# Patient Record
Sex: Female | Born: 1935
Health system: Southern US, Community
[De-identification: ages and names within clinical notes are randomized; demographics above are authoritative.]

## PROBLEM LIST (undated history)

## (undated) DIAGNOSIS — N189 Chronic kidney disease, unspecified: Secondary | ICD-10-CM

## (undated) DIAGNOSIS — R112 Nausea with vomiting, unspecified: Secondary | ICD-10-CM

## (undated) DIAGNOSIS — I10 Essential (primary) hypertension: Secondary | ICD-10-CM

## (undated) DIAGNOSIS — I714 Abdominal aortic aneurysm, without rupture, unspecified: Secondary | ICD-10-CM

## (undated) DIAGNOSIS — E785 Hyperlipidemia, unspecified: Secondary | ICD-10-CM

## (undated) DIAGNOSIS — Z9889 Other specified postprocedural states: Secondary | ICD-10-CM

## (undated) DIAGNOSIS — E079 Disorder of thyroid, unspecified: Secondary | ICD-10-CM

## (undated) HISTORY — DX: Abdominal aortic aneurysm, without rupture, unspecified: I71.40

## (undated) HISTORY — DX: Chronic kidney disease, unspecified: N18.9

## (undated) HISTORY — PX: EYE SURGERY: SHX253

## (undated) HISTORY — PX: CATARACT EXTRACTION, BILATERAL: SHX1313

## (undated) HISTORY — DX: Abdominal aortic aneurysm, without rupture: I71.4

## (undated) HISTORY — PX: ABDOMINAL HYSTERECTOMY: SHX81

## (undated) HISTORY — DX: Disorder of thyroid, unspecified: E07.9

---

## 1997-09-24 ENCOUNTER — Ambulatory Visit (HOSPITAL_COMMUNITY): Admission: RE | Admit: 1997-09-24 | Discharge: 1997-09-24 | Payer: Self-pay | Admitting: Gynecology

## 1997-09-24 ENCOUNTER — Encounter: Payer: Self-pay | Admitting: Gynecology

## 1998-11-17 ENCOUNTER — Other Ambulatory Visit: Admission: RE | Admit: 1998-11-17 | Discharge: 1998-11-17 | Payer: Self-pay | Admitting: Gynecology

## 1998-11-20 ENCOUNTER — Encounter: Payer: Self-pay | Admitting: Gynecology

## 1998-11-20 ENCOUNTER — Ambulatory Visit (HOSPITAL_COMMUNITY): Admission: RE | Admit: 1998-11-20 | Discharge: 1998-11-20 | Payer: Self-pay | Admitting: Gynecology

## 1998-12-04 ENCOUNTER — Encounter (INDEPENDENT_AMBULATORY_CARE_PROVIDER_SITE_OTHER): Payer: Self-pay

## 1998-12-04 ENCOUNTER — Other Ambulatory Visit: Admission: RE | Admit: 1998-12-04 | Discharge: 1998-12-04 | Payer: Self-pay | Admitting: Gynecology

## 2000-02-17 ENCOUNTER — Ambulatory Visit (HOSPITAL_COMMUNITY): Admission: RE | Admit: 2000-02-17 | Discharge: 2000-02-17 | Payer: Self-pay | Admitting: Gynecology

## 2000-02-17 ENCOUNTER — Encounter: Payer: Self-pay | Admitting: Gynecology

## 2000-02-23 ENCOUNTER — Other Ambulatory Visit: Admission: RE | Admit: 2000-02-23 | Discharge: 2000-02-23 | Payer: Self-pay | Admitting: Gynecology

## 2001-02-22 ENCOUNTER — Encounter: Admission: RE | Admit: 2001-02-22 | Discharge: 2001-02-22 | Payer: Self-pay | Admitting: Gynecology

## 2001-02-22 ENCOUNTER — Encounter: Payer: Self-pay | Admitting: Gynecology

## 2001-05-04 ENCOUNTER — Other Ambulatory Visit: Admission: RE | Admit: 2001-05-04 | Discharge: 2001-05-04 | Payer: Self-pay | Admitting: Gynecology

## 2001-05-09 ENCOUNTER — Encounter: Admission: RE | Admit: 2001-05-09 | Discharge: 2001-05-09 | Payer: Self-pay | Admitting: Gynecology

## 2001-05-09 ENCOUNTER — Encounter: Payer: Self-pay | Admitting: Gynecology

## 2002-03-29 ENCOUNTER — Encounter: Admission: RE | Admit: 2002-03-29 | Discharge: 2002-03-29 | Payer: Self-pay | Admitting: Family Medicine

## 2002-03-29 ENCOUNTER — Encounter: Payer: Self-pay | Admitting: Family Medicine

## 2002-10-10 ENCOUNTER — Other Ambulatory Visit: Admission: RE | Admit: 2002-10-10 | Discharge: 2002-10-10 | Payer: Self-pay | Admitting: Gynecology

## 2005-11-18 ENCOUNTER — Encounter: Admission: RE | Admit: 2005-11-18 | Discharge: 2005-11-18 | Payer: Self-pay | Admitting: Family Medicine

## 2005-12-21 ENCOUNTER — Encounter: Admission: RE | Admit: 2005-12-21 | Discharge: 2005-12-21 | Payer: Self-pay | Admitting: Family Medicine

## 2009-10-24 ENCOUNTER — Encounter (INDEPENDENT_AMBULATORY_CARE_PROVIDER_SITE_OTHER): Payer: Self-pay | Admitting: *Deleted

## 2009-11-06 ENCOUNTER — Encounter (INDEPENDENT_AMBULATORY_CARE_PROVIDER_SITE_OTHER): Payer: Self-pay | Admitting: *Deleted

## 2009-11-10 ENCOUNTER — Ambulatory Visit: Payer: Self-pay | Admitting: Gastroenterology

## 2009-11-24 ENCOUNTER — Ambulatory Visit: Payer: Self-pay | Admitting: Gastroenterology

## 2009-11-25 ENCOUNTER — Encounter: Payer: Self-pay | Admitting: Gastroenterology

## 2010-02-17 NOTE — Letter (Signed)
Summary: Pre Visit Letter Revised  [facility]  [place], [place] [address]   Phone: [phone]  Fax: [phone]        10/24/2009 MRN: [id number] Kristin Gross [address] [place],   [address]             Procedure Date:  11/24/2009   Welcome to the Gastroenterology Division at [facility].    You are scheduled to see a nurse for your pre-procedure visit on 11/10/2009 at 1:30PM on the 3rd floor at [facility] [place] [employer].  We ask that you try to arrive at our office 15 minutes prior to your appointment time to allow for check-in.  Please take a minute to review the attached form.  If you answer "Yes" to one or more of the questions on the first page, we ask that you call the person listed at your earliest opportunity.  If you answer "No" to all of the questions, please complete the rest of the form and bring it to your appointment.    Your nurse visit will consist of discussing your medical and surgical history, your immediate family medical history, and your medications.   If you are unable to list all of your medications on the form, please bring the medication bottles to your appointment and we will list them.  We will need to be aware of both prescribed and over the counter drugs.  We will need to know exact dosage information as well.    Please be prepared to read and sign documents such as consent forms, a financial agreement, and acknowledgement forms.  If necessary, and with your consent, a friend or relative is welcome to sit-in on the nurse visit with you.  Please bring your insurance card so that we may make a copy of it.  If your insurance requires a referral to see a specialist, please bring your referral form from your primary care physician.  No co-pay is required for this nurse visit.     If you cannot keep your appointment, please call [phone] to cancel or reschedule prior to your appointment date.  This  allows [place] the opportunity to schedule an appointment for another patient in need of care.    Thank you for choosing [facility] for your medical needs.  We appreciate the opportunity to care for you.  Please visit [place] at our website  to learn more about our practice.  Sincerely, The Gastroenterology Division

## 2010-02-17 NOTE — Miscellaneous (Signed)
Summary: previsit prep/rm  Clinical Lists Changes  Medications: Added new medication of MOVIPREP 100 GM  SOLR (PEG-KCL-NACL-NASULF-NA ASC-C) As per prep instructions. - Signed Rx of MOVIPREP 100 GM  SOLR (PEG-KCL-NACL-NASULF-NA ASC-C) As per prep instructions.;  #1 x 0;  Signed;  Entered by: Sundra Aland RN;  Authorized by: Sable Feil MD [facility];  Method used: Electronically to [facility]  [phone]*, [address], [place], [place], [place]  [address], Ph: [phone] or [phone], Fax: [phone] Observations: Added new observation of ALLERGY REV: Done (11/10/2009 13:24) Added new observation of NKA: T (11/10/2009 13:24)    Prescriptions: MOVIPREP 100 GM  SOLR (PEG-KCL-NACL-NASULF-NA ASC-C) As per prep instructions.  #1 x 0   Entered by:   Sundra Aland RN   Authorized by:   Sable Feil MD [facility]   Signed by:   Sundra Aland RN on 11/10/2009   Method used:   Electronically to        [facility]  [phone]* (retail)       [address]       [place], [place]  [address]       Ph: [phone] or [phone]       Fax: [phone]   RxID:   [id number]

## 2010-02-17 NOTE — Letter (Signed)
Summary: Patient Notice- Polyp Results  [facility]  [address] [place], [place] [address]   Phone: [phone]  Fax: [phone]        November 25, 2009 MRN: [id number]    Kristin Gross [place], [place]  [address]    Dear Ms. Kristin Gross,  I am pleased to inform you that the colon polyp(s) removed during your recent colonoscopy was (were) found to be benign (no cancer detected) upon pathologic examination.  I recommend you have a repeat colonoscopy examination in 5_ years to look for recurrent polyps, as having colon polyps increases your risk for having recurrent polyps or even colon cancer in the future.Your polyps were hyperplastic polyps, I would recommend followup in 5 years time because of the multiple polyps we encountered.  Should you develop new or worsening symptoms of abdominal pain, bowel habit changes or bleeding from the rectum or bowels, please schedule an evaluation with either your primary care physician or with me.  Additional information/recommendations:  _xx_ No further action with gastroenterology is needed at this time. Please      follow-up with your primary care physician for your other healthcare      needs.  __ Please call [phone] to schedule a return visit to review your      situation.  __ Please keep your follow-up visit as already scheduled.  __ Continue treatment plan as outlined the day of your exam.  Please call us if you are having persistent problems or have questions about your condition that have not been fully answered at this time.  Sincerely,  Sable Feil MD [facility]  This letter has been electronically signed by your physician.  Appended Document: Patient Notice- Polyp Results letter mailed

## 2010-02-17 NOTE — Letter (Signed)
Summary: Moviprep Instructions  [facility]  [address]   [place], [place] [address]   Phone: [phone]  Fax: [phone]       Kristin Gross    December 02, 1934    MRN: [id number]        Procedure Day Sudie Grumbling: Monday, 11-24-09     Arrival Time: 8:30 a.m.     Procedure Time: 9:30 a.m.     Location of Procedure:                    x [facility] (4th Floor)                        [place]   Starting 5 days prior to your procedure 11-19-09 do not eat nuts, seeds, popcorn, corn, beans, peas,  salads, or any raw vegetables.  Do not take any fiber supplements (e.g. Metamucil, Citrucel, and Benefiber).  THE DAY BEFORE YOUR PROCEDURE         DATE: 11-23-09   DAY: Sunday  1.  Drink clear liquids the entire day-NO SOLID FOOD  2.  Do not drink anything colored red or purple.  Avoid juices with pulp.  No orange juice.  3.  Drink at least 64 oz. (8 glasses) of fluid/clear liquids during the day to prevent dehydration and help the prep work efficiently.  CLEAR LIQUIDS INCLUDE: Water Jello Ice Popsicles Tea (sugar ok, no milk/cream) Powdered fruit flavored drinks Coffee (sugar ok, no milk/cream) Gatorade Juice: apple, white grape, white cranberry  Lemonade Clear bullion, consomm, broth Carbonated beverages (any kind) Strained chicken noodle soup Hard Candy                             4.  In the morning, mix first dose of MoviPrep solution:    Empty 1 Pouch A and 1 Pouch B into the disposable container    Add lukewarm drinking water to the top line of the container. Mix to dissolve    Refrigerate (mixed solution should be used within 24 hrs)  5.  Begin drinking the prep at 5:00 p.m. The MoviPrep container is divided by 4 marks.   Every 15 minutes drink the solution down to the next mark (approximately 8 oz) until the full liter is complete.   6.  Follow completed prep with 16 oz of clear liquid of your choice  (Nothing red or purple).  Continue to drink clear liquids until bedtime.  7.  Before going to bed, mix second dose of MoviPrep solution:    Empty 1 Pouch A and 1 Pouch B into the disposable container    Add lukewarm drinking water to the top line of the container. Mix to dissolve    Refrigerate  THE DAY OF YOUR PROCEDURE      DATE: 11-24-09  DAY: Monday  Beginning at  4:30 a.m. (5 hours before procedure):         1. Every 15 minutes, drink the solution down to the next mark (approx 8 oz) until the full liter is complete.  2. Follow completed prep with 16 oz. of clear liquid of your choice.    3. You may drink clear liquids until  7:30 a.m. (2 HOURS BEFORE PROCEDURE).   MEDICATION INSTRUCTIONS  Unless otherwise instructed, you should take regular prescription medications with a small sip of water   as early as possible the  morning of your procedure.    Additional medication instructions: n/a         OTHER INSTRUCTIONS  You will need a responsible adult at least 74 years of age to accompany you and drive you home.   This person must remain in the waiting room during your procedure.  Wear loose fitting clothing that is easily removed.  Leave jewelry and other valuables at home.  However, you may wish to bring a book to read or  an iPod/MP3 player to listen to music as you wait for your procedure to start.  Remove all body piercing jewelry and leave at home.  Total time from sign-in until discharge is approximately 2-3 hours.  You should go home directly after your procedure and rest.  You can resume normal activities the  day after your procedure.  The day of your procedure you should not:   Drive   Make legal decisions   Operate machinery   Drink alcohol   Return to work  You will receive specific instructions about eating, activities and medications before you leave.    The above instructions have been reviewed and explained to me by   Sundra Aland RN   November 10, 2009 1:48 PM   I fully understand and can verbalize these instructions _____________________________ Date _________

## 2010-02-17 NOTE — Procedures (Signed)
Summary: Colonoscopy  Patient: [name] Note: All result statuses are Final unless otherwise noted.  Tests: (1) Colonoscopy (COL)   COL Colonoscopy           [address]     [place], [place]  [address]           COLONOSCOPY PROCEDURE REPORT           PATIENT:  Kristin, Gross  MR#:  [id number]     BIRTHDATE:  03-13-1934, 75 yrs. old  GENDER:  female     ENDOSCOPIST:  Loralee Pacas. Sharlett Iles, MD, [facility]     REF. BY:  [name], M.D.     PROCEDURE DATE:  11/24/2009     PROCEDURE:  Colonoscopy with biopsy and snare polypectomy     ASA CLASS:  Class II     INDICATIONS:  Routine Risk Screening     MEDICATIONS:   Fentanyl 50 mcg IV, Versed 5 mg IV           DESCRIPTION OF PROCEDURE:   After the risks benefits and     alternatives of the procedure were thoroughly explained, informed     consent was obtained.  Digital rectal exam was performed and     revealed no abnormalities.   The LB PCF-H180AL [id number] endoscope     was introduced through the anus and advanced to the cecum, which     was identified by both the appendix and ileocecal valve, without     limitations.  The quality of the prep was excellent, using     MoviPrep.  The instrument was then slowly withdrawn as the colon     was fully examined.     <<PROCEDUREIMAGES>>           FINDINGS:  A sessile polyp was found. 8MM FLAT POLYP AT HEPATIC     FLEXURE HOT SNARE REMOVED.#1.  Moderate diverticulosis was found     in the sigmoid to descending colon segments.  There were multiple     polyps identified and removed. GREATER TAN 10 SMALL 2-5MM NODULES     HOT BX. AND SNARE REMOVED AND CAUTERIZED IN RECTUM.   Retroflexed     views in the rectum revealed no abnormalities.    The scope was     then withdrawn from the patient and the procedure completed.           COMPLICATIONS:  None     ENDOSCOPIC IMPRESSION:     1) Sessile polyp     2) Moderate diverticulosis in the sigmoid to descending  colon     segments     3) Polyps, multiple     1.RIGHT COLON ADENOMA     2.HYPERPLASTIC RECTAL NODULES.     RECOMMENDATIONS:     1) Await pathology results     2) Repeat Colonoscopy in 3 years.     3) high fiber diet     REPEAT EXAM:  No           ______________________________     Loralee Pacas. Sharlett Iles, MD, [facility]           CC:           n.     eSIGNED:   Loralee Pacas. Patterson at 11/24/2009 10:30 AM           Andrey Farmer, [id number]  Note: An exclamation mark (!) indicates a result  that was not dispersed into the flowsheet. Document Creation Date: 11/24/2009 10:31 AM _______________________________________________________________________  (1) Order result status: Final Collection or observation date-time: 11/24/2009 10:22 Requested date-time:  Receipt date-time:  Reported date-time:  Referring Physician:   Ordering Physician: Verl Blalock [phone]) Specimen Source:  Source: [name] Order Number: [id number] Lab site:   Appended Document: Colonoscopy     Procedures Next Due Date:    Colonoscopy: 11/2014

## 2010-08-11 ENCOUNTER — Ambulatory Visit (HOSPITAL_BASED_OUTPATIENT_CLINIC_OR_DEPARTMENT_OTHER): Admit: 2010-08-11 | Payer: Self-pay | Admitting: Orthopedic Surgery

## 2013-04-16 ENCOUNTER — Other Ambulatory Visit (HOSPITAL_COMMUNITY): Payer: Self-pay | Admitting: Family Medicine

## 2013-04-16 ENCOUNTER — Other Ambulatory Visit: Payer: Self-pay | Admitting: Family Medicine

## 2013-04-16 DIAGNOSIS — Z1231 Encounter for screening mammogram for malignant neoplasm of breast: Secondary | ICD-10-CM

## 2013-05-08 ENCOUNTER — Ambulatory Visit
Admission: RE | Admit: 2013-05-08 | Discharge: 2013-05-08 | Disposition: A | Discharge status: Home / Self Care | Payer: Medicare Other | Source: Ambulatory Visit | Attending: Family Medicine | Admitting: Family Medicine

## 2013-05-08 ENCOUNTER — Ambulatory Visit (HOSPITAL_COMMUNITY): Payer: Self-pay

## 2013-05-08 ENCOUNTER — Encounter (INDEPENDENT_AMBULATORY_CARE_PROVIDER_SITE_OTHER): Payer: Self-pay

## 2013-05-08 DIAGNOSIS — Z1231 Encounter for screening mammogram for malignant neoplasm of breast: Secondary | ICD-10-CM

## 2013-08-31 ENCOUNTER — Encounter: Payer: Self-pay | Admitting: Family Medicine

## 2014-11-26 ENCOUNTER — Encounter: Payer: Self-pay | Admitting: Internal Medicine

## 2015-04-23 ENCOUNTER — Other Ambulatory Visit: Payer: Self-pay | Admitting: Family Medicine

## 2015-04-23 DIAGNOSIS — L989 Disorder of the skin and subcutaneous tissue, unspecified: Secondary | ICD-10-CM | POA: Diagnosis not present

## 2015-04-23 DIAGNOSIS — Z1231 Encounter for screening mammogram for malignant neoplasm of breast: Secondary | ICD-10-CM

## 2015-04-23 DIAGNOSIS — M8580 Other specified disorders of bone density and structure, unspecified site: Secondary | ICD-10-CM | POA: Diagnosis not present

## 2015-04-23 DIAGNOSIS — E039 Hypothyroidism, unspecified: Secondary | ICD-10-CM | POA: Diagnosis not present

## 2015-04-23 DIAGNOSIS — C44519 Basal cell carcinoma of skin of other part of trunk: Secondary | ICD-10-CM | POA: Diagnosis not present

## 2015-04-23 DIAGNOSIS — E7800 Pure hypercholesterolemia, unspecified: Secondary | ICD-10-CM | POA: Diagnosis not present

## 2015-04-23 DIAGNOSIS — I129 Hypertensive chronic kidney disease with stage 1 through stage 4 chronic kidney disease, or unspecified chronic kidney disease: Secondary | ICD-10-CM | POA: Diagnosis not present

## 2015-04-23 DIAGNOSIS — N183 Chronic kidney disease, stage 3 unspecified: Secondary | ICD-10-CM

## 2015-04-23 DIAGNOSIS — E559 Vitamin D deficiency, unspecified: Secondary | ICD-10-CM | POA: Diagnosis not present

## 2015-04-23 DIAGNOSIS — Z720 Tobacco use: Secondary | ICD-10-CM | POA: Diagnosis not present

## 2015-04-23 DIAGNOSIS — L729 Follicular cyst of the skin and subcutaneous tissue, unspecified: Secondary | ICD-10-CM | POA: Diagnosis not present

## 2015-04-23 DIAGNOSIS — Z0000 Encounter for general adult medical examination without abnormal findings: Secondary | ICD-10-CM | POA: Diagnosis not present

## 2015-04-28 ENCOUNTER — Ambulatory Visit
Admission: RE | Admit: 2015-04-28 | Discharge: 2015-04-28 | Disposition: A | Discharge status: Home / Self Care | Payer: Medicare Other | Source: Ambulatory Visit | Attending: Family Medicine | Admitting: Family Medicine

## 2015-04-28 DIAGNOSIS — N189 Chronic kidney disease, unspecified: Secondary | ICD-10-CM | POA: Diagnosis not present

## 2015-04-28 DIAGNOSIS — N183 Chronic kidney disease, stage 3 unspecified: Secondary | ICD-10-CM

## 2015-05-12 ENCOUNTER — Ambulatory Visit
Admission: RE | Admit: 2015-05-12 | Discharge: 2015-05-12 | Disposition: A | Discharge status: Home / Self Care | Payer: Medicare Other | Source: Ambulatory Visit | Attending: Family Medicine | Admitting: Family Medicine

## 2015-05-12 DIAGNOSIS — Z1231 Encounter for screening mammogram for malignant neoplasm of breast: Secondary | ICD-10-CM | POA: Diagnosis not present

## 2015-05-22 DIAGNOSIS — M8589 Other specified disorders of bone density and structure, multiple sites: Secondary | ICD-10-CM | POA: Diagnosis not present

## 2015-05-22 DIAGNOSIS — M859 Disorder of bone density and structure, unspecified: Secondary | ICD-10-CM | POA: Diagnosis not present

## 2015-05-29 DIAGNOSIS — C44519 Basal cell carcinoma of skin of other part of trunk: Secondary | ICD-10-CM | POA: Diagnosis not present

## 2015-05-29 DIAGNOSIS — Z85828 Personal history of other malignant neoplasm of skin: Secondary | ICD-10-CM | POA: Diagnosis not present

## 2015-05-29 DIAGNOSIS — L720 Epidermal cyst: Secondary | ICD-10-CM | POA: Diagnosis not present

## 2015-06-18 DIAGNOSIS — Z720 Tobacco use: Secondary | ICD-10-CM | POA: Diagnosis not present

## 2015-06-18 DIAGNOSIS — I129 Hypertensive chronic kidney disease with stage 1 through stage 4 chronic kidney disease, or unspecified chronic kidney disease: Secondary | ICD-10-CM | POA: Diagnosis not present

## 2015-06-18 DIAGNOSIS — N183 Chronic kidney disease, stage 3 (moderate): Secondary | ICD-10-CM | POA: Diagnosis not present

## 2015-06-18 DIAGNOSIS — N390 Urinary tract infection, site not specified: Secondary | ICD-10-CM | POA: Diagnosis not present

## 2015-12-15 DIAGNOSIS — I129 Hypertensive chronic kidney disease with stage 1 through stage 4 chronic kidney disease, or unspecified chronic kidney disease: Secondary | ICD-10-CM | POA: Diagnosis not present

## 2015-12-15 DIAGNOSIS — Z720 Tobacco use: Secondary | ICD-10-CM | POA: Diagnosis not present

## 2015-12-15 DIAGNOSIS — Z23 Encounter for immunization: Secondary | ICD-10-CM | POA: Diagnosis not present

## 2015-12-15 DIAGNOSIS — E039 Hypothyroidism, unspecified: Secondary | ICD-10-CM | POA: Diagnosis not present

## 2015-12-15 DIAGNOSIS — E7800 Pure hypercholesterolemia, unspecified: Secondary | ICD-10-CM | POA: Diagnosis not present

## 2015-12-15 DIAGNOSIS — N183 Chronic kidney disease, stage 3 (moderate): Secondary | ICD-10-CM | POA: Diagnosis not present

## 2015-12-30 DIAGNOSIS — I129 Hypertensive chronic kidney disease with stage 1 through stage 4 chronic kidney disease, or unspecified chronic kidney disease: Secondary | ICD-10-CM | POA: Diagnosis not present

## 2015-12-30 DIAGNOSIS — N183 Chronic kidney disease, stage 3 (moderate): Secondary | ICD-10-CM | POA: Diagnosis not present

## 2015-12-30 DIAGNOSIS — N390 Urinary tract infection, site not specified: Secondary | ICD-10-CM | POA: Diagnosis not present

## 2015-12-30 DIAGNOSIS — Z6829 Body mass index (BMI) 29.0-29.9, adult: Secondary | ICD-10-CM | POA: Diagnosis not present

## 2015-12-30 DIAGNOSIS — Z720 Tobacco use: Secondary | ICD-10-CM | POA: Diagnosis not present

## 2016-03-19 DIAGNOSIS — E7800 Pure hypercholesterolemia, unspecified: Secondary | ICD-10-CM | POA: Diagnosis not present

## 2016-06-09 DIAGNOSIS — Z0000 Encounter for general adult medical examination without abnormal findings: Secondary | ICD-10-CM | POA: Diagnosis not present

## 2016-06-09 DIAGNOSIS — I129 Hypertensive chronic kidney disease with stage 1 through stage 4 chronic kidney disease, or unspecified chronic kidney disease: Secondary | ICD-10-CM | POA: Diagnosis not present

## 2016-06-09 DIAGNOSIS — E559 Vitamin D deficiency, unspecified: Secondary | ICD-10-CM | POA: Diagnosis not present

## 2016-06-09 DIAGNOSIS — N183 Chronic kidney disease, stage 3 (moderate): Secondary | ICD-10-CM | POA: Diagnosis not present

## 2016-06-29 DIAGNOSIS — N183 Chronic kidney disease, stage 3 (moderate): Secondary | ICD-10-CM | POA: Diagnosis not present

## 2016-06-29 DIAGNOSIS — Z6829 Body mass index (BMI) 29.0-29.9, adult: Secondary | ICD-10-CM | POA: Diagnosis not present

## 2016-06-29 DIAGNOSIS — Z720 Tobacco use: Secondary | ICD-10-CM | POA: Diagnosis not present

## 2016-06-29 DIAGNOSIS — I129 Hypertensive chronic kidney disease with stage 1 through stage 4 chronic kidney disease, or unspecified chronic kidney disease: Secondary | ICD-10-CM | POA: Diagnosis not present

## 2016-12-22 DIAGNOSIS — N183 Chronic kidney disease, stage 3 (moderate): Secondary | ICD-10-CM | POA: Diagnosis not present

## 2016-12-22 DIAGNOSIS — E559 Vitamin D deficiency, unspecified: Secondary | ICD-10-CM | POA: Diagnosis not present

## 2016-12-22 DIAGNOSIS — I129 Hypertensive chronic kidney disease with stage 1 through stage 4 chronic kidney disease, or unspecified chronic kidney disease: Secondary | ICD-10-CM | POA: Diagnosis not present

## 2016-12-22 DIAGNOSIS — E039 Hypothyroidism, unspecified: Secondary | ICD-10-CM | POA: Diagnosis not present

## 2017-01-14 DIAGNOSIS — N179 Acute kidney failure, unspecified: Secondary | ICD-10-CM | POA: Diagnosis not present

## 2017-01-24 DIAGNOSIS — N179 Acute kidney failure, unspecified: Secondary | ICD-10-CM | POA: Diagnosis not present

## 2017-02-09 DIAGNOSIS — N179 Acute kidney failure, unspecified: Secondary | ICD-10-CM | POA: Diagnosis not present

## 2017-03-02 DIAGNOSIS — N183 Chronic kidney disease, stage 3 (moderate): Secondary | ICD-10-CM | POA: Diagnosis not present

## 2017-03-02 DIAGNOSIS — I129 Hypertensive chronic kidney disease with stage 1 through stage 4 chronic kidney disease, or unspecified chronic kidney disease: Secondary | ICD-10-CM | POA: Diagnosis not present

## 2017-03-02 DIAGNOSIS — N179 Acute kidney failure, unspecified: Secondary | ICD-10-CM | POA: Diagnosis not present

## 2017-03-02 DIAGNOSIS — Z720 Tobacco use: Secondary | ICD-10-CM | POA: Diagnosis not present

## 2017-03-02 DIAGNOSIS — N390 Urinary tract infection, site not specified: Secondary | ICD-10-CM | POA: Diagnosis not present

## 2017-03-03 ENCOUNTER — Other Ambulatory Visit: Payer: Self-pay | Admitting: *Deleted

## 2017-03-03 DIAGNOSIS — I713 Abdominal aortic aneurysm, ruptured, unspecified: Secondary | ICD-10-CM

## 2017-03-03 DIAGNOSIS — N179 Acute kidney failure, unspecified: Secondary | ICD-10-CM | POA: Diagnosis not present

## 2017-03-03 DIAGNOSIS — I714 Abdominal aortic aneurysm, without rupture: Secondary | ICD-10-CM | POA: Diagnosis not present

## 2017-03-03 DIAGNOSIS — N281 Cyst of kidney, acquired: Secondary | ICD-10-CM | POA: Diagnosis not present

## 2017-03-09 ENCOUNTER — Ambulatory Visit
Admission: RE | Admit: 2017-03-09 | Discharge: 2017-03-09 | Disposition: A | Discharge status: Home / Self Care | Payer: Medicare Other | Source: Ambulatory Visit | Attending: Vascular Surgery | Admitting: Vascular Surgery

## 2017-03-09 ENCOUNTER — Other Ambulatory Visit: Payer: Self-pay | Admitting: Vascular Surgery

## 2017-03-09 DIAGNOSIS — D3502 Benign neoplasm of left adrenal gland: Secondary | ICD-10-CM | POA: Insufficient documentation

## 2017-03-09 DIAGNOSIS — K439 Ventral hernia without obstruction or gangrene: Secondary | ICD-10-CM | POA: Diagnosis not present

## 2017-03-09 DIAGNOSIS — I2510 Atherosclerotic heart disease of native coronary artery without angina pectoris: Secondary | ICD-10-CM | POA: Diagnosis not present

## 2017-03-09 DIAGNOSIS — K8020 Calculus of gallbladder without cholecystitis without obstruction: Secondary | ICD-10-CM | POA: Insufficient documentation

## 2017-03-09 DIAGNOSIS — I714 Abdominal aortic aneurysm, without rupture: Secondary | ICD-10-CM | POA: Diagnosis not present

## 2017-03-09 DIAGNOSIS — I700 Atherosclerosis of aorta: Secondary | ICD-10-CM | POA: Insufficient documentation

## 2017-03-09 DIAGNOSIS — K5730 Diverticulosis of large intestine without perforation or abscess without bleeding: Secondary | ICD-10-CM | POA: Diagnosis not present

## 2017-03-09 DIAGNOSIS — I713 Abdominal aortic aneurysm, ruptured, unspecified: Secondary | ICD-10-CM

## 2017-03-09 DIAGNOSIS — N281 Cyst of kidney, acquired: Secondary | ICD-10-CM | POA: Insufficient documentation

## 2017-03-09 HISTORY — DX: Essential (primary) hypertension: I10

## 2017-03-09 LAB — POCT I-STAT CREATININE
Creatinine, Ser: 3.6 mg/dL — ABNORMAL HIGH (ref 0.44–1.00)
Creatinine, Ser: 3.6 mg/dL — ABNORMAL HIGH (ref 0.44–1.00)

## 2017-03-11 ENCOUNTER — Ambulatory Visit (INDEPENDENT_AMBULATORY_CARE_PROVIDER_SITE_OTHER): Payer: Medicare Other | Admitting: Vascular Surgery

## 2017-03-11 ENCOUNTER — Other Ambulatory Visit: Payer: Self-pay | Admitting: *Deleted

## 2017-03-11 ENCOUNTER — Encounter: Payer: Self-pay | Admitting: *Deleted

## 2017-03-11 ENCOUNTER — Encounter: Payer: Self-pay | Admitting: Vascular Surgery

## 2017-03-11 VITALS — BP 147/76 | HR 69 | Temp 97.1°F | Resp 18 | Ht 65.0 in | Wt 168.0 lb

## 2017-03-11 DIAGNOSIS — I714 Abdominal aortic aneurysm, without rupture, unspecified: Secondary | ICD-10-CM

## 2017-03-11 NOTE — Progress Notes (Signed)
Patient ID: Kristin Gross, female   DOB: 03/18/1934, 83 y.o.   MRN: [id number]  Reason for Consult: New Patient (Initial Visit) (eval 6.3 AAA - Dr. Moshe Cipro)   Referred by No ref. provider found  Subjective:     HPI:  Kristin Gross is a 83 y.o. female with history of chronic kidney disease and hypertension.  She is a chronic everyday smoker for a long time.  She does not have any heart history no history of strokes.  She does remain very active.  Recently CT scan demonstrated greater than 6 cm aneurysm.  She does have some upper back pain but this is chronic.  She has no new abdominal pain she does eat well without issue.  She has not had any recent weight loss or weight gain she remains strong and active.  She has no chest pain at rest and she can easily walk a flight of stairs.  She does not take any blood thinners but does take aspirin daily.  Past Medical History:  Diagnosis Date  . AAA (abdominal aortic aneurysm) ([place])   . Chronic kidney disease   . Hypertension    Family History  Problem Relation Age of Onset  . Heart disease Sister     Short Social History:  Social History   Tobacco Use  . Smoking status: Current Every Day Smoker    Types: Cigarettes  . Smokeless tobacco: Never Used  Substance Use Topics  . Alcohol use: No    Frequency: Never    Allergies  Allergen Reactions  . Lisinopril Swelling    Current Outpatient Medications  Medication Sig Dispense Refill  . amLODipine (NORVASC) 5 MG tablet Take 5 mg by mouth daily.    Marland Kitchen aspirin 81 MG tablet Take 81 mg by mouth daily.    . Cholecalciferol (VITAMIN D-3) 1000 units CAPS Take by mouth.    . ciprofloxacin (CIPRO) 500 MG tablet Take 500 mg by mouth daily.    . furosemide (LASIX) 40 MG tablet Take 40 mg by mouth daily.    Marland Kitchen levothyroxine (SYNTHROID, LEVOTHROID) 88 MCG tablet Take 88 mcg by mouth daily before breakfast.    . metoprolol tartrate (LOPRESSOR) 50 MG tablet Take 50 mg by mouth 2 (two) times  daily.     No current facility-administered medications for this visit.     Review of Systems  Constitutional:  Constitutional negative. Eyes: Eyes negative.  Respiratory: Respiratory negative.  Cardiovascular: Cardiovascular negative.  GI: Gastrointestinal negative.  Musculoskeletal: Positive for back pain.  Neurological: Neurological negative. Hematologic: Hematologic/lymphatic negative.  Psychiatric: Psychiatric negative.        Objective:  Objective   Vitals:   03/11/17 1105 03/11/17 1113  BP: (!) 160/74 (!) 147/76  Pulse: 69   Resp: 18   Temp: (!) 97.1 F (36.2 C)   TempSrc: Oral   SpO2: 93%   Weight: 168 lb (76.2 kg)   Height: 5\' 5"  (1.651 m)    Body mass index is 27.96 kg/m.  Physical Exam  Constitutional: She is oriented to person, place, and time. She appears well-developed.  HENT:  Head: Normocephalic.  Neck: Normal range of motion. Neck supple.  Cardiovascular: Normal rate.  Pulses:      Femoral pulses are 0 on the right side, and 0 on the left side.      Popliteal pulses are 0 on the right side, and 0 on the left side.  Pulmonary/Chest: Effort normal.  Abdominal: Soft. She exhibits mass.  Musculoskeletal: She exhibits no edema.  Neurological: She is alert and oriented to person, place, and time.  Skin: Skin is dry.  Psychiatric: She has a normal mood and affect. Her behavior is normal. Judgment and thought content normal.    Data: IMPRESSION: 1. Infrarenal abdominal aortic aneurysm measuring 7.3 cm in length. The transverse diameter of this aneurysm is 6.9 cm from right to left dimension and 6.0 cm from anterior to posterior dimension. No periaortic fluid. Aneurysm terminates slightly above the bifurcation.  2. Extensive aortoiliac atherosclerosis. Note that there are foci of hemodynamically significant obstruction in each common iliac artery based on degree of calcification present. There also foci of coronary artery calcification.       Assessment/Plan:     83 year old female with new diagnosis of large abdominal aortic aneurysm measuring at least 6-1/2 cm in diameter.  I have quoted her at least a 10-20% risk of rupture in the next year this is based on studies involving mostly man and it could be slightly higher.  Her options include nonoperative management versus open intervention is I do not think she has an endovascular option.  She does maybe have occluded iliac arteries given that I cannot palpate femoral pulses and they are heavily calcified.  From that standpoint I talked about her open option being likely aortobifemoral bypass.  She would have significant cardiopulmonary risks and I will have her evaluated by a cardiologist first.  She will also have risk of at least temporary dialysis given her chronic kidney disease although I do think we can clamp below the renal arteries.  She is accompanied by her husband and daughter today all are in agreement to proceed.  Should there further questions they can follow-up we will otherwise get her scheduled today pending cardiac evaluation.  They do demonstrate very good understanding.     Waynetta Sandy MD [facility]

## 2017-03-14 DIAGNOSIS — I714 Abdominal aortic aneurysm, without rupture, unspecified: Secondary | ICD-10-CM | POA: Insufficient documentation

## 2017-03-14 DIAGNOSIS — Z01810 Encounter for preprocedural cardiovascular examination: Secondary | ICD-10-CM | POA: Insufficient documentation

## 2017-03-14 DIAGNOSIS — I129 Hypertensive chronic kidney disease with stage 1 through stage 4 chronic kidney disease, or unspecified chronic kidney disease: Secondary | ICD-10-CM | POA: Insufficient documentation

## 2017-03-14 NOTE — Progress Notes (Addendum)
Cardiology Office Note:    Date:  03/15/2017   ID:  [name], DOB 31-Aug-1934, MRN [id number]  PCP:  Myrtis Ser, CNM  Cardiologist:  Shirlee More, MD   Referring MD: Bobette Mo*  ASSESSMENT:    1. Preoperative cardiovascular examination   2. Hypertensive chronic kidney disease, unspecified CKD stage   3. AAA (abdominal aortic aneurysm) without rupture (HCC)    PLAN:    In order of problems listed above:  1. Preoperative cardiovascular evaluation summary  Echo is reassuring, proceed with surgery 03/22/17 Study Conclusions - Left ventricle: The cavity size was normal. Wall thickness was increased in a pattern of moderate LVH. Systolic function was normal. The estimated ejection fraction was in the range of 60% to 65%. Wall motion was normal; there were no regional wall motion abnormalities. Doppler parameters are consistent with abnormal left ventricular relaxation (grade 1 diastolic dysfunction). - Aortic valve: There was mild regurgitation.  Surgeon: Dr Donzetta Matters Procedure: Abdominal aortic aneurysm open surgery The surgery is elective Active cardiac problems hypertension.  The cardiac status is stable. The planned procedure is high risk.  Yes The functional capacity is 4 mets or greater yes Recent cardiac tests performed EKG today sinus rhythm minor nonspecific ST changes otherwise normal Given the above his overall risk for the planned procedure is acceptable provided cardiac imaging echocardiogram and myocardial perfusion study are normal low risk which will be performed quickly prior to her planned surgery 03/24/2017 Antiplatelet/ anticoagulant recommendation: She may withdraw aspirin perioperatively Other cardiac medication or device recommendation: Continue her antihypertensives including beta-blocker after surgery.  Cardiac monitoring for the first 24-48 hours EKG postoperative day 1 Anesthesia recommendation: None Observation,  monitoring,and postoperative test recommendation: Telemetry 24 hours after surgery The patient is optimized from a cardiology perspective: Pending results echocardiogram myocardial perfusion study with an addendum to this note  2.  Stable continue current antihypertensives 3.  At this time I proceed with planning her surgery as a suspected cardiac studies myocardial perfusion for ejection fraction and ischemia and echocardiogram first cardiac structure and function will be reassuring.   Medication Adjustments/Labs and Tests Ordered: Current medicines are reviewed at length with the patient today.  Concerns regarding medicines are outlined above.  Orders Placed This Encounter  Procedures  . Myocardial Perfusion Imaging  . EKG 12-Lead  . ECHOCARDIOGRAM COMPLETE   No orders of the defined types were placed in this encounter.    Chief Complaint  Patient presents with  . New Patient (Initial Visit)    preoperative cardiology evaluation    History of Present Illness:    Kristin Gross is a 83 y.o. female with hypertension, CKD and > 6 cm AAA who is being seen today for the evaluation of cardioc surgical risk at the request of Bobette Mo*. She was found to have a large infrarenal abdominal aortic aneurysm incidentally on renal duplex.  She is not a candidate for endovascular repair and is planned for open surgery.  Cardiovascular risk factors include cigarette smoking and hypertension.  She also has hyperlipidemia controlled with a statin.  She remains active she can climb a flight of stairs without symptoms and does not have limiting shortness of breath chest pain palpitation or syncope.  Her exercise tolerance is in the range of 6-7 mets.  She does get burning in her shoulders but is not anginal in nature.  She has no history of congenital rheumatic heart disease or arrhythmia.  She is referred for preoperative  cardiovascular evaluation because of the high risk of the surgery and  her hypertension asked her to undergo further evaluation with quick cardiac echo and myocardial perfusion study provided she does not have high risk markers and left ventricular function is normal I would proceed with her planned surgery at 03/24/2017 I will send an addendum to this note.  Past Medical History:  Diagnosis Date  . AAA (abdominal aortic aneurysm) ([place])   . Chronic kidney disease   . Hypertension   . Thyroid disease     Past Surgical History:  Procedure Laterality Date  . ABDOMINAL HYSTERECTOMY      Current Medications: Current Meds  Medication Sig  . acetaminophen (TYLENOL) 500 MG tablet Take 500 mg by mouth daily as needed for moderate pain or headache.  Marland Kitchen amLODipine (NORVASC) 5 MG tablet Take 5 mg by mouth daily.  Marland Kitchen aspirin 81 MG tablet Take 81 mg by mouth daily.  . Cholecalciferol (VITAMIN D-3) 1000 units CAPS Take 2,000 Units by mouth daily.   . furosemide (LASIX) 40 MG tablet Take 40 mg by mouth daily.  Marland Kitchen levothyroxine (SYNTHROID, LEVOTHROID) 88 MCG tablet Take 88 mcg by mouth daily before breakfast.  . metoprolol tartrate (LOPRESSOR) 50 MG tablet Take 50 mg by mouth 2 (two) times daily.  . rosuvastatin (CRESTOR) 10 MG tablet Take 10 mg by mouth daily.     Allergies:   Lisinopril   Social History   Socioeconomic History  . Marital status: Married    Spouse name: None  . Number of children: None  . Years of education: None  . Highest education level: None  Social Needs  . Financial resource strain: None  . Food insecurity - worry: None  . Food insecurity - inability: None  . Transportation needs - medical: None  . Transportation needs - non-medical: None  Occupational History  . None  Tobacco Use  . Smoking status: Current Every Day Smoker    Packs/day: 1.00    Years: 60.00    Pack years: 60.00    Types: Cigarettes  . Smokeless tobacco: Never Used  Substance and Sexual Activity  . Alcohol use: No    Frequency: Never  . Drug use: No  . Sexual  activity: None  Other Topics Concern  . None  Social History Narrative  . None     Family History: The patient's family history includes Cancer in her mother; Heart attack in her sister; Heart disease in her sister; Hypertension in her sister.  ROS:   Review of Systems  Constitution: Negative.  HENT: Negative.   Eyes: Negative.   Cardiovascular: Negative.   Respiratory: Negative.   Endocrine: Negative.   Skin: Negative.   Musculoskeletal: Positive for joint pain (shoulders).  Gastrointestinal: Negative.   Genitourinary: Negative.   Neurological: Negative.   Psychiatric/Behavioral: Negative.   Allergic/Immunologic: Negative.    Please see the history of present illness.     All other systems reviewed and are negative.  EKGs/Labs/Other Studies Reviewed:    The following studies were reviewed today:   EKG:  EKG is  ordered today.  The ekg ordered today demonstrates sinus rhythm nonspecific ST change  CTA abdomen and pelvis: IMPRESSION: 1. Infrarenal abdominal aortic aneurysm measuring 7.3 cm in length. The transverse diameter of this aneurysm is 6.9 cm from right to left dimension and 6.0 cm from anterior to posterior dimension. No periaortic fluid. Aneurysm terminates slightly above the bifurcation. 2. Extensive aortoiliac atherosclerosis. Note that there are foci of  hemodynamically significant obstruction in each common iliac artery based on degree of calcification present. There also foci of coronary artery calcification. 3. There is a small amount of air in the urinary bladder. If patient has not been recently catheterized, this finding is concerning for potential gas-forming organism within the urinary bladder. Urinalysis correlation in this regard advised. 4. Cholelithiasis. Gallbladder is somewhat contracted. No pericholecystic fluid. 5. Extensive colonic diverticulosis, mainly in the sigmoid region. No diverticulitis. No bowel obstruction. 6.  Benign left  adrenal adenoma. 7.  Small ventral hernia containing only fat. 8.  Fibrotic change in lung bases. 9.  Uterus absent. 9. Multiple renal cysts. No hydronephrosis. No renal or ureteral calculus.  Recent Labs: 03/09/2017: Creatinine, Ser 3.60  Recent Lipid Panel No results found for: CHOL, TRIG, HDL, CHOLHDL, VLDL, LDLCALC, LDLDIRECT  Physical Exam:    VS:  BP 138/80 (BP Location: Right Arm, Patient Position: Sitting, Cuff Size: Normal)   Pulse 72   Ht 5\' 5"  (1.651 m)   Wt 156 lb 12 oz (71.1 kg)   SpO2 97%   BMI 26.08 kg/m     Wt Readings from Last 3 Encounters:  03/15/17 156 lb 12 oz (71.1 kg)  03/11/17 168 lb (76.2 kg)     GEN:  Well nourished, well developed in no acute distress HEENT: Normal NECK: No JVD; No carotid bruits LYMPHATICS: No lymphadenopathy CARDIAC: RRR, no murmurs, rubs, gallops RESPIRATORY:  Clear to auscultation without rales, wheezing or rhonchi  ABDOMEN: Soft, non-tender, non-distended MUSCULOSKELETAL:  No edema; No deformity  SKIN: Warm and dry NEUROLOGIC:  Alert and oriented x 3 PSYCHIATRIC:  Normal affect     Signed, Shirlee More, MD  03/15/2017 4:54 PM    [facility]

## 2017-03-15 ENCOUNTER — Encounter: Payer: Self-pay | Admitting: Cardiology

## 2017-03-15 ENCOUNTER — Ambulatory Visit: Payer: Medicare Other | Admitting: Cardiology

## 2017-03-15 DIAGNOSIS — Z01810 Encounter for preprocedural cardiovascular examination: Secondary | ICD-10-CM | POA: Diagnosis not present

## 2017-03-15 DIAGNOSIS — I714 Abdominal aortic aneurysm, without rupture, unspecified: Secondary | ICD-10-CM

## 2017-03-15 DIAGNOSIS — I129 Hypertensive chronic kidney disease with stage 1 through stage 4 chronic kidney disease, or unspecified chronic kidney disease: Secondary | ICD-10-CM | POA: Diagnosis not present

## 2017-03-15 NOTE — Patient Instructions (Signed)
Medication Instructions:  Your physician recommends that you continue on your current medications as directed. Please refer to the Current Medication list given to you today.  Labwork: None  Testing/Procedures: You had an EKG today.  Your physician has requested that you have an echocardiogram. Echocardiography is a painless test that uses sound waves to create images of your heart. It provides your doctor with information about the size and shape of your heart and how well your heart's chambers and valves are working. This procedure takes approximately one hour. There are no restrictions for this procedure.  Your physician has requested that you have en exercise stress myoview. For further information please visit [email or web address]. Please follow instruction sheet, as given.  Follow-Up: Your physician recommends that you schedule a follow-up appointment as needed if symptoms worsen or fail to improve.  Any Other Special Instructions Will Be Listed Below (If Applicable).     If you need a refill on your cardiac medications before your next appointment, please call your pharmacy.

## 2017-03-17 ENCOUNTER — Telehealth (HOSPITAL_COMMUNITY): Payer: Self-pay | Admitting: *Deleted

## 2017-03-17 NOTE — Telephone Encounter (Signed)
Patient given detailed instructions per Myocardial Perfusion Study Information Sheet for the test on 03/22/17. Patient notified to arrive 15 minutes early and that it is imperative to arrive on time for appointment to keep from having the test rescheduled.  If you need to cancel or reschedule your appointment, please call the office within 24 hours of your appointment. . Patient verbalized understanding.Kristin Gross

## 2017-03-18 ENCOUNTER — Other Ambulatory Visit: Payer: Self-pay | Admitting: Vascular Surgery

## 2017-03-18 NOTE — Pre-Procedure Instructions (Signed)
PHILIP ECKERSLEY  03/18/2017      [place], [place] [address] [place] [place] [address] Phone: [phone] Fax: [phone]    Your procedure is scheduled on Thursday, March 7th   Report to [facility] Admitting at 5:30 AM             (posted surgery time 7:30a - 11:30a)   Call this number if you have problems the morning of surgery:  [phone]   Remember:                         4-5 days prior to surgery, STOP TAKING any Vitamins, Herbal Supplements, Anti-inflammatories.              You will continue to take your aspirin, even on the day of surgery. (per surgeon's request)   Do not eat food or drink liquids after midnight, Wednesday.   Take these medicines the morning of surgery with A SIP OF WATER : Amlodipine, Levothyroxine, Metoprolol.   Do not wear jewelry, make-up or nail polish.  Do not wear lotions, powders, perfumes, or deodorant.  Do not shave 48 hours prior to surgery.   Do not bring valuables to the hospital.  [facility] is not responsible for any belongings or valuables.  Contacts, dentures or bridgework may not be worn into surgery.  Leave your suitcase in the car.  After surgery it may be brought to your room.  For patients admitted to the hospital, discharge time will be determined by your treatment team.  Please read over the following fact sheets that you were given. Pain Booklet, MRSA Information and Surgical Site Infection Prevention      [place]- Preparing For Surgery  Before surgery, you can play an important role. Because skin is not sterile, your skin needs to be as free of germs as possible. You can reduce the number of germs on your skin by washing with CHG (chlorahexidine gluconate) Soap before surgery.  CHG is an antiseptic cleaner which kills germs and bonds with the skin to continue killing germs even after washing.  Please do not use if you have an allergy to  CHG or antibacterial soaps. If your skin becomes reddened/irritated stop using the CHG.  Do not shave (including legs and underarms) for at least 48 hours prior to first CHG shower. It is OK to shave your face.  Please follow these instructions carefully.   1. Shower the NIGHT BEFORE SURGERY and the MORNING OF SURGERY with CHG.   2. If you chose to wash your hair, wash your hair first as usual with your normal shampoo.  3. After you shampoo, rinse your hair and body thoroughly to remove the shampoo.  4. Use CHG as you would any other liquid soap. You can apply CHG directly to the skin and wash gently with a scrungie or a clean washcloth.   5. Apply the CHG Soap to your body ONLY FROM THE NECK DOWN.  Do not use on open wounds or open sores. Avoid contact with your eyes, ears, mouth and genitals (private parts). Wash Face and genitals (private parts)  with your normal soap.  6. Wash thoroughly, paying special attention to the area where your surgery will be performed.  7. Thoroughly rinse your body with warm water from the neck down.  8. DO NOT shower/wash with your normal soap after using and rinsing off the  CHG Soap.  9. Pat yourself dry with a CLEAN TOWEL.  10. Wear CLEAN PAJAMAS to bed the night before surgery, wear comfortable clothes the morning of surgery  11. Place CLEAN SHEETS on your bed the night of your first shower and DO NOT SLEEP WITH PETS.    Day of Surgery: Do not apply any deodorants/lotions. Please wear clean clothes to the hospital/surgery center.

## 2017-03-21 ENCOUNTER — Other Ambulatory Visit: Payer: Self-pay

## 2017-03-21 ENCOUNTER — Encounter (HOSPITAL_COMMUNITY): Payer: Self-pay

## 2017-03-21 ENCOUNTER — Encounter (HOSPITAL_COMMUNITY)
Admission: RE | Admit: 2017-03-21 | Discharge: 2017-03-21 | Disposition: A | Discharge status: Home / Self Care | Payer: Medicare Other | Source: Ambulatory Visit | Attending: Vascular Surgery | Admitting: Vascular Surgery

## 2017-03-21 DIAGNOSIS — E079 Disorder of thyroid, unspecified: Secondary | ICD-10-CM | POA: Insufficient documentation

## 2017-03-21 DIAGNOSIS — E876 Hypokalemia: Secondary | ICD-10-CM | POA: Diagnosis not present

## 2017-03-21 DIAGNOSIS — I2510 Atherosclerotic heart disease of native coronary artery without angina pectoris: Secondary | ICD-10-CM

## 2017-03-21 DIAGNOSIS — F17210 Nicotine dependence, cigarettes, uncomplicated: Secondary | ICD-10-CM | POA: Diagnosis not present

## 2017-03-21 DIAGNOSIS — N189 Chronic kidney disease, unspecified: Secondary | ICD-10-CM

## 2017-03-21 DIAGNOSIS — E039 Hypothyroidism, unspecified: Secondary | ICD-10-CM | POA: Diagnosis not present

## 2017-03-21 DIAGNOSIS — N179 Acute kidney failure, unspecified: Secondary | ICD-10-CM | POA: Diagnosis not present

## 2017-03-21 DIAGNOSIS — Q6102 Congenital multiple renal cysts: Secondary | ICD-10-CM | POA: Diagnosis not present

## 2017-03-21 DIAGNOSIS — I714 Abdominal aortic aneurysm, without rupture: Secondary | ICD-10-CM | POA: Diagnosis not present

## 2017-03-21 DIAGNOSIS — R0989 Other specified symptoms and signs involving the circulatory and respiratory systems: Secondary | ICD-10-CM | POA: Diagnosis not present

## 2017-03-21 DIAGNOSIS — I313 Pericardial effusion (noninflammatory): Secondary | ICD-10-CM

## 2017-03-21 DIAGNOSIS — I1310 Hypertensive heart and chronic kidney disease without heart failure, with stage 1 through stage 4 chronic kidney disease, or unspecified chronic kidney disease: Secondary | ICD-10-CM | POA: Insufficient documentation

## 2017-03-21 DIAGNOSIS — J449 Chronic obstructive pulmonary disease, unspecified: Secondary | ICD-10-CM | POA: Diagnosis not present

## 2017-03-21 DIAGNOSIS — N183 Chronic kidney disease, stage 3 (moderate): Secondary | ICD-10-CM | POA: Diagnosis not present

## 2017-03-21 DIAGNOSIS — Z8249 Family history of ischemic heart disease and other diseases of the circulatory system: Secondary | ICD-10-CM

## 2017-03-21 DIAGNOSIS — I700 Atherosclerosis of aorta: Secondary | ICD-10-CM | POA: Diagnosis not present

## 2017-03-21 DIAGNOSIS — Z01818 Encounter for other preprocedural examination: Secondary | ICD-10-CM | POA: Insufficient documentation

## 2017-03-21 DIAGNOSIS — D62 Acute posthemorrhagic anemia: Secondary | ICD-10-CM | POA: Diagnosis not present

## 2017-03-21 DIAGNOSIS — Z01810 Encounter for preprocedural cardiovascular examination: Secondary | ICD-10-CM | POA: Diagnosis not present

## 2017-03-21 DIAGNOSIS — I351 Nonrheumatic aortic (valve) insufficiency: Secondary | ICD-10-CM | POA: Insufficient documentation

## 2017-03-21 DIAGNOSIS — I129 Hypertensive chronic kidney disease with stage 1 through stage 4 chronic kidney disease, or unspecified chronic kidney disease: Secondary | ICD-10-CM | POA: Diagnosis not present

## 2017-03-21 DIAGNOSIS — E785 Hyperlipidemia, unspecified: Secondary | ICD-10-CM | POA: Diagnosis not present

## 2017-03-21 DIAGNOSIS — N184 Chronic kidney disease, stage 4 (severe): Secondary | ICD-10-CM | POA: Diagnosis not present

## 2017-03-21 DIAGNOSIS — I739 Peripheral vascular disease, unspecified: Secondary | ICD-10-CM | POA: Diagnosis not present

## 2017-03-21 HISTORY — DX: Other specified postprocedural states: Z98.890

## 2017-03-21 HISTORY — DX: Hyperlipidemia, unspecified: E78.5

## 2017-03-21 HISTORY — DX: Nausea with vomiting, unspecified: R11.2

## 2017-03-21 LAB — COMPREHENSIVE METABOLIC PANEL
ALBUMIN: 3.7 g/dL (ref 3.5–5.0)
ALT: 12 U/L — ABNORMAL LOW (ref 14–54)
AST: 18 U/L (ref 15–41)
Alkaline Phosphatase: 72 U/L (ref 38–126)
Anion gap: 15 (ref 5–15)
BUN: 49 mg/dL — AB (ref 6–20)
CO2: 21 mmol/L — ABNORMAL LOW (ref 22–32)
Calcium: 9.2 mg/dL (ref 8.9–10.3)
Chloride: 103 mmol/L (ref 101–111)
Creatinine, Ser: 4.23 mg/dL — ABNORMAL HIGH (ref 0.44–1.00)
GFR calc Af Amer: 10 mL/min — ABNORMAL LOW (ref >60)
GFR calc non Af Amer: 9 mL/min — ABNORMAL LOW (ref >60)
GLUCOSE: 92 mg/dL (ref 65–99)
POTASSIUM: 3.9 mmol/L (ref 3.5–5.1)
SODIUM: 139 mmol/L (ref 135–145)
Total Bilirubin: 0.5 mg/dL (ref 0.3–1.2)
Total Protein: 6.6 g/dL (ref 6.5–8.1)

## 2017-03-21 LAB — CBC
HCT: 42.9 % (ref 36.0–46.0)
Hemoglobin: 14.4 g/dL (ref 12.0–15.0)
MCH: 31.2 pg (ref 26.0–34.0)
MCHC: 33.6 g/dL (ref 30.0–36.0)
MCV: 93.1 fL (ref 78.0–100.0)
Platelets: 258 10*3/uL (ref 150–400)
RBC: 4.61 MIL/uL (ref 3.87–5.11)
RDW: 13.8 % (ref 11.5–15.5)
WBC: 12.3 10*3/uL — AB (ref 4.0–10.5)

## 2017-03-21 LAB — BLOOD GAS, ARTERIAL
ACID-BASE EXCESS: 0.7 mmol/L (ref 0.0–2.0)
Bicarbonate: 24.9 mmol/L (ref 20.0–28.0)
DRAWN BY: 42180
FIO2: 21
O2 SAT: 95.9 %
PH ART: 7.407 (ref 7.350–7.450)
Patient temperature: 98.6
pCO2 arterial: 40.3 mmHg (ref 32.0–48.0)
pO2, Arterial: 77 mmHg — ABNORMAL LOW (ref 83.0–108.0)

## 2017-03-21 LAB — PROTIME-INR
INR: 0.95
Prothrombin Time: 12.5 seconds (ref 11.4–15.2)

## 2017-03-21 LAB — ABO/RH: ABO/RH(D): O POS

## 2017-03-21 LAB — URINALYSIS, ROUTINE W REFLEX MICROSCOPIC
BACTERIA UA: NONE SEEN
Bilirubin Urine: NEGATIVE
GLUCOSE, UA: NEGATIVE mg/dL
Ketones, ur: NEGATIVE mg/dL
NITRITE: NEGATIVE
Protein, ur: 30 mg/dL — AB
SPECIFIC GRAVITY, URINE: 1.014 (ref 1.005–1.030)
pH: 5 (ref 5.0–8.0)

## 2017-03-21 LAB — APTT: aPTT: 30 seconds (ref 24–36)

## 2017-03-21 LAB — SURGICAL PCR SCREEN
MRSA, PCR: NEGATIVE
Staphylococcus aureus: NEGATIVE

## 2017-03-21 NOTE — Pre-Procedure Instructions (Signed)
Kristin Gross  03/21/2017      [place], [place] [address] [place] [place] [address] Phone: [phone] Fax: [phone]    Your procedure is scheduled on Thursday, March 7th   Report to [facility] Admitting at 5:30 AM             (posted surgery time 7:30a - 11:30a)   Call this number if you have problems the morning of surgery:  [phone]   Remember:                         4-5 days prior to surgery, STOP TAKING any Vitamins, Herbal Supplements, Anti-inflammatories.              You will continue to take your aspirin, even on the day of surgery. (per surgeon's request)   Do not eat food or drink liquids after midnight, Wednesday.   Take these medicines the morning of surgery with A SIP OF WATER : Amlodipine, Levothyroxine, Metoprolol, Aspirin.   Do not wear jewelry, make-up or nail polish.  Do not wear lotions, powders, perfumes, or deodorant.  Do not shave 48 hours prior to surgery.   Do not bring valuables to the hospital.  [facility] is not responsible for any belongings or valuables.  Contacts, dentures or bridgework may not be worn into surgery.  Leave your suitcase in the car.  After surgery it may be brought to your room.  For patients admitted to the hospital, discharge time will be determined by your treatment team.  Please read over the following fact sheets that you were given. Pain Booklet, MRSA Information and Surgical Site Infection Prevention      [place]- Preparing For Surgery  Before surgery, you can play an important role. Because skin is not sterile, your skin needs to be as free of germs as possible. You can reduce the number of germs on your skin by washing with CHG (chlorahexidine gluconate) Soap before surgery.  CHG is an antiseptic cleaner which kills germs and bonds with the skin to continue killing germs even after washing.  Please do not use if you have an  allergy to CHG or antibacterial soaps. If your skin becomes reddened/irritated stop using the CHG.  Do not shave (including legs and underarms) for at least 48 hours prior to first CHG shower. It is OK to shave your face.  Please follow these instructions carefully.   1. Shower the NIGHT BEFORE SURGERY and the MORNING OF SURGERY with CHG.   2. If you chose to wash your hair, wash your hair first as usual with your normal shampoo.  3. After you shampoo, rinse your hair and body thoroughly to remove the shampoo.  4. Use CHG as you would any other liquid soap. You can apply CHG directly to the skin and wash gently with a scrungie or a clean washcloth.   5. Apply the CHG Soap to your body ONLY FROM THE NECK DOWN.  Do not use on open wounds or open sores. Avoid contact with your eyes, ears, mouth and genitals (private parts). Wash Face and genitals (private parts)  with your normal soap.  6. Wash thoroughly, paying special attention to the area where your surgery will be performed.  7. Thoroughly rinse your body with warm water from the neck down.  8. DO NOT shower/wash with your normal soap after using and rinsing off  the CHG Soap.  9. Pat yourself dry with a CLEAN TOWEL.  10. Wear CLEAN PAJAMAS to bed the night before surgery, wear comfortable clothes the morning of surgery  11. Place CLEAN SHEETS on your bed the night of your first shower and DO NOT SLEEP WITH PETS.    Day of Surgery: Do not apply any deodorants/lotions. Please wear clean clothes to the hospital/surgery center.

## 2017-03-21 NOTE — Progress Notes (Signed)
PCP: [name] MD Cardiologist: Dr. Bettina Gavia, saw for the first time to establish care 03/15/17. Clearance note in Epic.  EKG: 02/2017 CXR: 2007 ECHO: scheduled for 03/22/17 Stress Test: scheduled for 03/22/17 Cardiac Cath: denies  Patient denies shortness of breath, fever, cough, and chest pain at PAT appointment.  Patient verbalized understanding of instructions provided today at the PAT appointment.  Patient asked to review instructions at home and day of surgery.

## 2017-03-22 ENCOUNTER — Other Ambulatory Visit: Payer: Self-pay

## 2017-03-22 ENCOUNTER — Ambulatory Visit (HOSPITAL_BASED_OUTPATIENT_CLINIC_OR_DEPARTMENT_OTHER): Payer: Medicare Other

## 2017-03-22 DIAGNOSIS — Z01810 Encounter for preprocedural cardiovascular examination: Secondary | ICD-10-CM

## 2017-03-22 LAB — MYOCARDIAL PERFUSION IMAGING
CHL CUP NUCLEAR SRS: 5
CHL CUP NUCLEAR SSS: 8
LV dias vol: 72 mL (ref 46–106)
LV sys vol: 23 mL
NUC STRESS TID: 0.89
Peak HR: 105 {beats}/min
RATE: 0.29
Rest HR: 81 {beats}/min
SDS: 4

## 2017-03-22 LAB — ECHOCARDIOGRAM COMPLETE
Height: 65 in
Weight: 2496 oz

## 2017-03-22 MED ORDER — REGADENOSON 0.4 MG/5ML IV SOLN
0.4000 mg | Freq: Once | INTRAVENOUS | Status: AC
  Administered 2017-03-22: 0.4 mg via INTRAVENOUS

## 2017-03-22 MED ORDER — TECHNETIUM TC 99M TETROFOSMIN IV KIT
10.6000 | PACK | Freq: Once | INTRAVENOUS | Status: AC | PRN
  Administered 2017-03-22: 10.6 via INTRAVENOUS
  Filled 2017-03-22: qty 11

## 2017-03-22 MED ORDER — TECHNETIUM TC 99M TETROFOSMIN IV KIT
31.7000 | PACK | Freq: Once | INTRAVENOUS | Status: AC | PRN
  Administered 2017-03-22: 31.7 via INTRAVENOUS
  Filled 2017-03-22: qty 32

## 2017-03-22 NOTE — Progress Notes (Addendum)
Anesthesia Chart Review:  Pt is a an 83 year old female scheduled for aortobifemoral bypass graft on 03/24/2017 with Servando Snare, MD  - PCP is [name], MD - Cardiologist Shirlee More, MD cleared pt for surgery. Last office visit 03/15/17.  - Nephrologist is Corliss Parish, MD is aware of pt's worsening renal function and is aware of upcoming surgery.    PMH includes:  AAA, HTN, hyperlipidemia, CKD, thyroid disease, post-op N/V.  Current smoker. BMI 26.0  Medications include: amlodipine, ASA 81mg , lasix, levothyroxine, metoprolol, rosuvastatin  BP (!) 164/69   Pulse 67   Temp 36.4 C   Resp 18   Ht 5\' 5"  (1.651 m)   Wt 166 lb 8 oz (75.5 kg)   SpO2 98%   BMI 27.71 kg/m   Preoperative labs reviewed.   - Cr 4.23, BUN 49 - Per nephrology records, Cr was 2.9 in 01/2017. Dr. Claretha Cooper note indicates it is possible pt will need dialysis after surgery. Per Jeannie Done in Dr. Shelva Majestic office, Dr. Moshe Cipro is aware  EKG 03/15/17: NSR. RBBB.   Nuclear stress test 03/22/17:   Nuclear stress EF: 68%.  There was no ST segment deviation noted during stress.  The study is normal.  This is a low risk study.  The left ventricular ejection fraction is hyperdynamic (>65%). - Normal pharmacologic nuclear stress test with no evidence for prior infarct or ischemia.  - There is an abnormal uptake in the left breast. Additional imaging with a mammogram or breast MRI is recommended.   Echo 03/22/17:  - Left ventricle: The cavity size was normal. Wall thickness was increased in a pattern of moderate LVH. Systolic function was normal. The estimated ejection fraction was in the range of 60% to 65%. Wall motion was normal; there were no regional wall motion abnormalities. Doppler parameters are consistent with abnormal left ventricular relaxation (grade 1 diastolic dysfunction). - Aortic valve: There was mild regurgitation. - Atrial septum: No defect or patent foramen ovale was identified. -  Pericardium, extracardiac: A trivial pericardial effusion was identified.  CT abdomen, pelvis 03/09/17:  1. Infrarenal abdominal aortic aneurysm measuring 7.3 cm in length. The transverse diameter of this aneurysm is 6.9 cm from right to left dimension and 6.0 cm from anterior to posterior dimension. No periaortic fluid. Aneurysm terminates slightly above the bifurcation. 2. Extensive aortoiliac atherosclerosis. Note that there are foci of hemodynamically significant obstruction in each common iliac artery based on degree of calcification present. There also foci of coronary artery calcification. 3. There is a small amount of air in the urinary bladder. If patient has not been recently catheterized, this finding is concerning for potential gas-forming organism within the urinary bladder. Urinalysis correlation in this regard advised. 4. Cholelithiasis. Gallbladder is somewhat contracted. No pericholecystic fluid. 5. Extensive colonic diverticulosis, mainly in the sigmoid region. No diverticulitis. No bowel obstruction. 6.  Benign left adrenal adenoma. 7.  Small ventral hernia containing only fat. 8.  Fibrotic change in lung bases. 9.  Uterus absent. 10. Multiple renal cysts. No hydronephrosis. No renal or ureteral calculus.  If no changes, I anticipate pt can proceed with surgery as scheduled.   Willeen Cass, FNP-BC [facility] Short Stay Surgical Center/Anesthesiology Phone: [phone] 03/23/2017 10:18 AM

## 2017-03-23 NOTE — Anesthesia Preprocedure Evaluation (Addendum)
Anesthesia Evaluation  Patient identified by MRN, date of birth, ID band Patient awake    Reviewed: Allergy & Precautions, NPO status , Patient's Chart, lab work & pertinent test results, reviewed documented beta blocker date and time   History of Anesthesia Complications Negative for: history of anesthetic complications  Airway Mallampati: II  TM Distance: >3 FB Neck ROM: Full    Dental  (+) Edentulous Upper, Edentulous Lower   Pulmonary COPD, Current Smoker,    breath sounds clear to auscultation       Cardiovascular hypertension, Pt. on medications and Pt. on home beta blockers (-) angina+ Peripheral Vascular Disease (AAA)   Rhythm:Regular Rate:Normal  03/22/17 ECHO: EF 60-65%, mild AI 03/22/17 Stress: EF 68%,Normal pharmacologic nuclear stress test with no evidence for prior infarct or ischemia   Neuro/Psych negative neurological ROS     GI/Hepatic negative GI ROS, Neg liver ROS,   Endo/Other  Hypothyroidism   Renal/GU Renal InsufficiencyRenal disease (creat 4.23 )     Musculoskeletal   Abdominal   Peds  Hematology negative hematology ROS (+)   Anesthesia Other Findings   Reproductive/Obstetrics                            Anesthesia Physical Anesthesia Plan  ASA: III  Anesthesia Plan: General   Post-op Pain Management:    Induction: Intravenous  PONV Risk Score and Plan: 3 and Ondansetron, Dexamethasone and Treatment may vary due to age or medical condition  Airway Management Planned: Oral ETT  Additional Equipment: Arterial line, PA Cath and Ultrasound Guidance Line Placement  Intra-op Plan:   Post-operative Plan: Extubation in OR  Informed Consent: I have reviewed the patients History and Physical, chart, labs and discussed the procedure including the risks, benefits and alternatives for the proposed anesthesia with the patient or authorized representative who has indicated  his/her understanding and acceptance.     Plan Discussed with: CRNA and Surgeon  Anesthesia Plan Comments: (Plan routine monitors, A line, PA cath, GETA with possible post op ventilation)        Anesthesia Quick Evaluation

## 2017-03-24 ENCOUNTER — Encounter (HOSPITAL_COMMUNITY)
Admission: RE | Disposition: A | Discharge status: Home Health | Payer: Self-pay | Source: Ambulatory Visit | Attending: Vascular Surgery

## 2017-03-24 ENCOUNTER — Inpatient Hospital Stay (HOSPITAL_COMMUNITY)
Admission: RE | Admit: 2017-03-24 | Discharge: 2017-03-31 | DRG: 271 | Disposition: A | Discharge status: Home Health | Payer: Medicare Other | Source: Ambulatory Visit | Attending: Vascular Surgery | Admitting: Vascular Surgery

## 2017-03-24 ENCOUNTER — Inpatient Hospital Stay (HOSPITAL_COMMUNITY): Payer: Medicare Other | Admitting: Emergency Medicine

## 2017-03-24 ENCOUNTER — Inpatient Hospital Stay (HOSPITAL_COMMUNITY): Payer: Medicare Other

## 2017-03-24 ENCOUNTER — Inpatient Hospital Stay (HOSPITAL_COMMUNITY): Payer: Medicare Other | Admitting: Certified Registered Nurse Anesthetist

## 2017-03-24 DIAGNOSIS — E785 Hyperlipidemia, unspecified: Secondary | ICD-10-CM | POA: Diagnosis present

## 2017-03-24 DIAGNOSIS — Z8249 Family history of ischemic heart disease and other diseases of the circulatory system: Secondary | ICD-10-CM | POA: Diagnosis not present

## 2017-03-24 DIAGNOSIS — E039 Hypothyroidism, unspecified: Secondary | ICD-10-CM | POA: Diagnosis present

## 2017-03-24 DIAGNOSIS — N183 Chronic kidney disease, stage 3 (moderate): Secondary | ICD-10-CM | POA: Diagnosis not present

## 2017-03-24 DIAGNOSIS — I700 Atherosclerosis of aorta: Secondary | ICD-10-CM | POA: Diagnosis present

## 2017-03-24 DIAGNOSIS — Q6102 Congenital multiple renal cysts: Secondary | ICD-10-CM | POA: Diagnosis not present

## 2017-03-24 DIAGNOSIS — N184 Chronic kidney disease, stage 4 (severe): Secondary | ICD-10-CM | POA: Diagnosis present

## 2017-03-24 DIAGNOSIS — N189 Chronic kidney disease, unspecified: Secondary | ICD-10-CM | POA: Diagnosis not present

## 2017-03-24 DIAGNOSIS — N179 Acute kidney failure, unspecified: Secondary | ICD-10-CM | POA: Diagnosis present

## 2017-03-24 DIAGNOSIS — I714 Abdominal aortic aneurysm, without rupture, unspecified: Secondary | ICD-10-CM

## 2017-03-24 DIAGNOSIS — D62 Acute posthemorrhagic anemia: Secondary | ICD-10-CM | POA: Diagnosis not present

## 2017-03-24 DIAGNOSIS — J449 Chronic obstructive pulmonary disease, unspecified: Secondary | ICD-10-CM | POA: Diagnosis present

## 2017-03-24 DIAGNOSIS — I739 Peripheral vascular disease, unspecified: Secondary | ICD-10-CM | POA: Diagnosis not present

## 2017-03-24 DIAGNOSIS — Z9889 Other specified postprocedural states: Secondary | ICD-10-CM

## 2017-03-24 DIAGNOSIS — R0989 Other specified symptoms and signs involving the circulatory and respiratory systems: Secondary | ICD-10-CM | POA: Diagnosis not present

## 2017-03-24 DIAGNOSIS — I129 Hypertensive chronic kidney disease with stage 1 through stage 4 chronic kidney disease, or unspecified chronic kidney disease: Secondary | ICD-10-CM | POA: Diagnosis present

## 2017-03-24 DIAGNOSIS — E876 Hypokalemia: Secondary | ICD-10-CM | POA: Diagnosis not present

## 2017-03-24 DIAGNOSIS — F17210 Nicotine dependence, cigarettes, uncomplicated: Secondary | ICD-10-CM | POA: Diagnosis present

## 2017-03-24 DIAGNOSIS — Z8679 Personal history of other diseases of the circulatory system: Secondary | ICD-10-CM

## 2017-03-24 HISTORY — PX: AORTA - BILATERAL FEMORAL ARTERY BYPASS GRAFT: SHX1175

## 2017-03-24 LAB — CBC
HCT: 29.8 % — ABNORMAL LOW (ref 36.0–46.0)
Hemoglobin: 10 g/dL — ABNORMAL LOW (ref 12.0–15.0)
MCH: 31.6 pg (ref 26.0–34.0)
MCHC: 33.6 g/dL (ref 30.0–36.0)
MCV: 94.3 fL (ref 78.0–100.0)
Platelets: 170 10*3/uL (ref 150–400)
RBC: 3.16 MIL/uL — ABNORMAL LOW (ref 3.87–5.11)
RDW: 13.8 % (ref 11.5–15.5)
WBC: 20.6 10*3/uL — ABNORMAL HIGH (ref 4.0–10.5)

## 2017-03-24 LAB — BASIC METABOLIC PANEL
Anion gap: 9 (ref 5–15)
BUN: 39 mg/dL — ABNORMAL HIGH (ref 6–20)
CO2: 21 mmol/L — ABNORMAL LOW (ref 22–32)
Calcium: 7.7 mg/dL — ABNORMAL LOW (ref 8.9–10.3)
Chloride: 109 mmol/L (ref 101–111)
Creatinine, Ser: 3.75 mg/dL — ABNORMAL HIGH (ref 0.44–1.00)
GFR calc Af Amer: 12 mL/min — ABNORMAL LOW (ref >60)
GFR calc non Af Amer: 10 mL/min — ABNORMAL LOW (ref >60)
Glucose, Bld: 145 mg/dL — ABNORMAL HIGH (ref 65–99)
Potassium: 3.9 mmol/L (ref 3.5–5.1)
Sodium: 139 mmol/L (ref 135–145)

## 2017-03-24 LAB — PREPARE RBC (CROSSMATCH)

## 2017-03-24 LAB — APTT: aPTT: 31 seconds (ref 24–36)

## 2017-03-24 SURGERY — CREATION, BYPASS, ARTERIAL, AORTA TO FEMORAL, BILATERAL, USING GRAFT
Anesthesia: General | Site: Abdomen

## 2017-03-24 MED ORDER — POTASSIUM CHLORIDE CRYS ER 20 MEQ PO TBCR: 20.0000 meq | EXTENDED_RELEASE_TABLET | Freq: Every day | ORAL | Status: DC | PRN

## 2017-03-24 MED ORDER — ROCURONIUM BROMIDE 10 MG/ML (PF) SYRINGE
PREFILLED_SYRINGE | INTRAVENOUS | Status: AC
  Filled 2017-03-24: qty 5

## 2017-03-24 MED ORDER — HEPARIN SODIUM (PORCINE) 1000 UNIT/ML IJ SOLN
INTRAMUSCULAR | Status: DC | PRN
  Administered 2017-03-24: 8000 [IU] via INTRAVENOUS

## 2017-03-24 MED ORDER — CEFAZOLIN SODIUM-DEXTROSE 2-3 GM-%(50ML) IV SOLR
INTRAVENOUS | Status: DC | PRN
  Administered 2017-03-24: 2 g via INTRAVENOUS

## 2017-03-24 MED ORDER — ALBUMIN HUMAN 5 % IV SOLN
INTRAVENOUS | Status: DC | PRN
  Administered 2017-03-24 (×3): via INTRAVENOUS

## 2017-03-24 MED ORDER — PROPOFOL 10 MG/ML IV BOLUS
INTRAVENOUS | Status: DC | PRN
  Administered 2017-03-24: 50 mg via INTRAVENOUS

## 2017-03-24 MED ORDER — CHLORHEXIDINE GLUCONATE CLOTH 2 % EX PADS: 6.0000 | MEDICATED_PAD | Freq: Once | CUTANEOUS | Status: DC

## 2017-03-24 MED ORDER — PROTAMINE SULFATE 10 MG/ML IV SOLN
INTRAVENOUS | Status: AC
  Filled 2017-03-24: qty 5

## 2017-03-24 MED ORDER — ACETAMINOPHEN 325 MG PO TABS
325.0000 mg | ORAL_TABLET | ORAL | Status: DC | PRN
  Administered 2017-03-27 – 2017-03-28 (×2): 650 mg via ORAL
  Filled 2017-03-24 (×3): qty 2

## 2017-03-24 MED ORDER — LACTATED RINGERS IV SOLN
INTRAVENOUS | Status: DC | PRN
  Administered 2017-03-24 (×2): via INTRAVENOUS

## 2017-03-24 MED ORDER — MORPHINE SULFATE (PF) 4 MG/ML IV SOLN
4.0000 mg | INTRAVENOUS | Status: DC | PRN
  Administered 2017-03-25: 4 mg via INTRAVENOUS
  Filled 2017-03-24: qty 1

## 2017-03-24 MED ORDER — SODIUM CHLORIDE 0.9 % IV SOLN: INTRAVENOUS | Status: DC

## 2017-03-24 MED ORDER — FENTANYL CITRATE (PF) 250 MCG/5ML IJ SOLN
INTRAMUSCULAR | Status: AC
Start: 2017-03-24 — End: ?
  Filled 2017-03-24: qty 5

## 2017-03-24 MED ORDER — FENTANYL CITRATE (PF) 250 MCG/5ML IJ SOLN
INTRAMUSCULAR | Status: AC
  Filled 2017-03-24: qty 5

## 2017-03-24 MED ORDER — CEFAZOLIN SODIUM-DEXTROSE 2-4 GM/100ML-% IV SOLN
INTRAVENOUS | Status: AC
  Filled 2017-03-24: qty 100

## 2017-03-24 MED ORDER — ACETAMINOPHEN 325 MG RE SUPP
325.0000 mg | RECTAL | Status: DC | PRN
  Filled 2017-03-24: qty 2

## 2017-03-24 MED ORDER — CHLORHEXIDINE GLUCONATE 0.12 % MT SOLN
15.0000 mL | Freq: Two times a day (BID) | OROMUCOSAL | Status: DC
  Administered 2017-03-24 – 2017-03-27 (×7): 15 mL via OROMUCOSAL
  Filled 2017-03-24 (×4): qty 15

## 2017-03-24 MED ORDER — LACTATED RINGERS IV SOLN
INTRAVENOUS | Status: DC | PRN
  Administered 2017-03-24: 07:00:00 via INTRAVENOUS

## 2017-03-24 MED ORDER — DEXTROSE 5 % IV SOLN
INTRAVENOUS | Status: DC | PRN
  Administered 2017-03-24: 25 ug/min via INTRAVENOUS

## 2017-03-24 MED ORDER — FENTANYL CITRATE (PF) 250 MCG/5ML IJ SOLN
INTRAMUSCULAR | Status: DC | PRN
  Administered 2017-03-24: 50 ug via INTRAVENOUS
  Administered 2017-03-24: 100 ug via INTRAVENOUS
  Administered 2017-03-24: 50 ug via INTRAVENOUS
  Administered 2017-03-24: 500 ug via INTRAVENOUS
  Administered 2017-03-24: 50 ug via INTRAVENOUS

## 2017-03-24 MED ORDER — ALUM & MAG HYDROXIDE-SIMETH 200-200-20 MG/5ML PO SUSP: 15.0000 mL | ORAL | Status: DC | PRN

## 2017-03-24 MED ORDER — ONDANSETRON HCL 4 MG/2ML IJ SOLN
4.0000 mg | Freq: Four times a day (QID) | INTRAMUSCULAR | Status: DC | PRN
  Administered 2017-03-24 – 2017-03-25 (×2): 4 mg via INTRAVENOUS
  Filled 2017-03-24 (×2): qty 2

## 2017-03-24 MED ORDER — MIDAZOLAM HCL 2 MG/2ML IJ SOLN
INTRAMUSCULAR | Status: AC
  Filled 2017-03-24: qty 2

## 2017-03-24 MED ORDER — DEXAMETHASONE SODIUM PHOSPHATE 4 MG/ML IJ SOLN
INTRAMUSCULAR | Status: DC | PRN
  Administered 2017-03-24: 4 mg via INTRAVENOUS

## 2017-03-24 MED ORDER — GUAIFENESIN-DM 100-10 MG/5ML PO SYRP: 15.0000 mL | ORAL_SOLUTION | ORAL | Status: DC | PRN

## 2017-03-24 MED ORDER — EPHEDRINE SULFATE 50 MG/ML IJ SOLN
INTRAMUSCULAR | Status: DC | PRN
  Administered 2017-03-24: 5 mg via INTRAVENOUS

## 2017-03-24 MED ORDER — OXYCODONE-ACETAMINOPHEN 5-325 MG PO TABS: 1.0000 | ORAL_TABLET | ORAL | Status: DC | PRN

## 2017-03-24 MED ORDER — DEXAMETHASONE SODIUM PHOSPHATE 10 MG/ML IJ SOLN
INTRAMUSCULAR | Status: AC
  Filled 2017-03-24: qty 1

## 2017-03-24 MED ORDER — PANTOPRAZOLE SODIUM 40 MG PO TBEC
40.0000 mg | DELAYED_RELEASE_TABLET | Freq: Every day | ORAL | Status: DC
  Administered 2017-03-27 – 2017-03-31 (×5): 40 mg via ORAL
  Filled 2017-03-24 (×5): qty 1

## 2017-03-24 MED ORDER — ONDANSETRON HCL 4 MG/2ML IJ SOLN
INTRAMUSCULAR | Status: AC
  Filled 2017-03-24: qty 2

## 2017-03-24 MED ORDER — ROCURONIUM BROMIDE 100 MG/10ML IV SOLN
INTRAVENOUS | Status: DC | PRN
  Administered 2017-03-24: 20 mg via INTRAVENOUS
  Administered 2017-03-24: 10 mg via INTRAVENOUS
  Administered 2017-03-24: 50 mg via INTRAVENOUS
  Administered 2017-03-24: 20 mg via INTRAVENOUS

## 2017-03-24 MED ORDER — HEPARIN SODIUM (PORCINE) 5000 UNIT/ML IJ SOLN
5000.0000 [IU] | Freq: Three times a day (TID) | INTRAMUSCULAR | Status: DC
  Administered 2017-03-24 – 2017-03-31 (×20): 5000 [IU] via SUBCUTANEOUS
  Filled 2017-03-24 (×20): qty 1

## 2017-03-24 MED ORDER — EPHEDRINE 5 MG/ML INJ
INTRAVENOUS | Status: AC
  Filled 2017-03-24: qty 10

## 2017-03-24 MED ORDER — VASOPRESSIN 20 UNIT/ML IV SOLN
INTRAVENOUS | Status: AC
  Filled 2017-03-24: qty 1

## 2017-03-24 MED ORDER — SODIUM CHLORIDE 0.9 % IV SOLN
INTRAVENOUS | Status: DC | PRN
Start: 2017-03-24 — End: 2017-03-24
  Administered 2017-03-24: 10:00:00

## 2017-03-24 MED ORDER — LABETALOL HCL 5 MG/ML IV SOLN
10.0000 mg | INTRAVENOUS | Status: DC | PRN
  Administered 2017-03-26: 10 mg via INTRAVENOUS
  Filled 2017-03-24: qty 4

## 2017-03-24 MED ORDER — SODIUM CHLORIDE 0.9 % IV SOLN: 1.5000 g | INTRAVENOUS | Status: DC

## 2017-03-24 MED ORDER — PHENOL 1.4 % MT LIQD: 1.0000 | OROMUCOSAL | Status: DC | PRN

## 2017-03-24 MED ORDER — FENTANYL CITRATE (PF) 100 MCG/2ML IJ SOLN: 25.0000 ug | INTRAMUSCULAR | Status: DC | PRN

## 2017-03-24 MED ORDER — CEFAZOLIN SODIUM-DEXTROSE 2-4 GM/100ML-% IV SOLN
2.0000 g | Freq: Three times a day (TID) | INTRAVENOUS | Status: AC
  Administered 2017-03-24 (×2): 2 g via INTRAVENOUS
  Filled 2017-03-24 (×2): qty 100

## 2017-03-24 MED ORDER — PROTAMINE SULFATE 10 MG/ML IV SOLN
INTRAVENOUS | Status: DC | PRN
  Administered 2017-03-24: 50 mg via INTRAVENOUS

## 2017-03-24 MED ORDER — 0.9 % SODIUM CHLORIDE (POUR BTL) OPTIME
TOPICAL | Status: DC | PRN
  Administered 2017-03-24 (×3): 1000 mL

## 2017-03-24 MED ORDER — SUGAMMADEX SODIUM 200 MG/2ML IV SOLN
INTRAVENOUS | Status: AC
  Filled 2017-03-24: qty 2

## 2017-03-24 MED ORDER — DOCUSATE SODIUM 100 MG PO CAPS
100.0000 mg | ORAL_CAPSULE | Freq: Every day | ORAL | Status: DC
  Administered 2017-03-27 – 2017-03-31 (×3): 100 mg via ORAL
  Filled 2017-03-24 (×5): qty 1

## 2017-03-24 MED ORDER — ONDANSETRON HCL 4 MG/2ML IJ SOLN
INTRAMUSCULAR | Status: DC | PRN
  Administered 2017-03-24: 4 mg via INTRAVENOUS

## 2017-03-24 MED ORDER — SODIUM CHLORIDE 0.9 % IV SOLN: Freq: Once | INTRAVENOUS | Status: DC

## 2017-03-24 MED ORDER — PROPOFOL 10 MG/ML IV BOLUS
INTRAVENOUS | Status: AC
  Filled 2017-03-24: qty 20

## 2017-03-24 MED ORDER — SODIUM CHLORIDE 0.9 % IV SOLN
INTRAVENOUS | Status: DC
  Administered 2017-03-24: 14:00:00 via INTRAVENOUS

## 2017-03-24 MED ORDER — LACTATED RINGERS IV SOLN
INTRAVENOUS | Status: DC | PRN
  Administered 2017-03-24 (×2): via INTRAVENOUS

## 2017-03-24 MED ORDER — HEMOSTATIC AGENTS (NO CHARGE) OPTIME
TOPICAL | Status: DC | PRN
  Administered 2017-03-24: 1 via TOPICAL

## 2017-03-24 MED ORDER — METOPROLOL TARTRATE 5 MG/5ML IV SOLN: 2.0000 mg | INTRAVENOUS | Status: DC | PRN

## 2017-03-24 MED ORDER — SODIUM CHLORIDE 0.9 % IV SOLN: 500.0000 mL | Freq: Once | INTRAVENOUS | Status: DC | PRN

## 2017-03-24 MED ORDER — HYDRALAZINE HCL 20 MG/ML IJ SOLN: 5.0000 mg | INTRAMUSCULAR | Status: DC | PRN

## 2017-03-24 MED ORDER — ORAL CARE MOUTH RINSE
15.0000 mL | Freq: Two times a day (BID) | OROMUCOSAL | Status: DC
  Administered 2017-03-25 – 2017-03-26 (×2): 15 mL via OROMUCOSAL

## 2017-03-24 MED ORDER — PHENYLEPHRINE HCL 10 MG/ML IJ SOLN
INTRAMUSCULAR | Status: AC
  Filled 2017-03-24: qty 2

## 2017-03-24 MED ORDER — SUGAMMADEX SODIUM 200 MG/2ML IV SOLN
INTRAVENOUS | Status: DC | PRN
  Administered 2017-03-24: 141.6 mg via INTRAVENOUS

## 2017-03-24 MED ORDER — MAGNESIUM SULFATE 2 GM/50ML IV SOLN: 2.0000 g | Freq: Every day | INTRAVENOUS | Status: DC | PRN

## 2017-03-24 SURGICAL SUPPLY — 55 items
CANISTER SUCT 3000ML PPV (MISCELLANEOUS) ×2 IMPLANT
CLIP VESOCCLUDE MED 24/CT (CLIP) ×2 IMPLANT
CLIP VESOCCLUDE SM WIDE 24/CT (CLIP) ×2 IMPLANT
DERMABOND ADVANCED (GAUZE/BANDAGES/DRESSINGS) ×2
DERMABOND ADVANCED .7 DNX12 (GAUZE/BANDAGES/DRESSINGS) ×2 IMPLANT
ELECT BLADE 4.0 EZ CLEAN MEGAD (MISCELLANEOUS) ×2
ELECT BLADE 6.5 EXT (BLADE) IMPLANT
ELECT REM PT RETURN 9FT ADLT (ELECTROSURGICAL) ×4
ELECTRODE BLDE 4.0 EZ CLN MEGD (MISCELLANEOUS) ×1 IMPLANT
ELECTRODE REM PT RTRN 9FT ADLT (ELECTROSURGICAL) ×2 IMPLANT
FELT TEFLON 1X6 (MISCELLANEOUS) ×2 IMPLANT
GLOVE BIO SURGEON STRL SZ7 (GLOVE) ×2 IMPLANT
GLOVE BIO SURGEON STRL SZ7.5 (GLOVE) ×4 IMPLANT
GLOVE BIOGEL PI IND STRL 6.5 (GLOVE) ×3 IMPLANT
GLOVE BIOGEL PI IND STRL 8 (GLOVE) ×1 IMPLANT
GLOVE BIOGEL PI INDICATOR 6.5 (GLOVE) ×3
GLOVE BIOGEL PI INDICATOR 8 (GLOVE) ×1
GOWN STRL REUS W/ TWL LRG LVL3 (GOWN DISPOSABLE) ×1 IMPLANT
GOWN STRL REUS W/ TWL XL LVL3 (GOWN DISPOSABLE) ×5 IMPLANT
GOWN STRL REUS W/TWL LRG LVL3 (GOWN DISPOSABLE) ×1
GOWN STRL REUS W/TWL XL LVL3 (GOWN DISPOSABLE) ×5
GRAFT HEMASHIELD 16X8MM (Vascular Products) ×2 IMPLANT
INSERT FOGARTY 61MM (MISCELLANEOUS) ×2 IMPLANT
INSERT FOGARTY SM (MISCELLANEOUS) ×4 IMPLANT
KIT BASIN OR (CUSTOM PROCEDURE TRAY) ×2 IMPLANT
KIT ROOM TURNOVER OR (KITS) ×2 IMPLANT
NS IRRIG 1000ML POUR BTL (IV SOLUTION) ×6 IMPLANT
PACK AORTA (CUSTOM PROCEDURE TRAY) ×2 IMPLANT
PAD ARMBOARD 7.5X6 YLW CONV (MISCELLANEOUS) ×4 IMPLANT
RETAINER VISCERA MED (MISCELLANEOUS) ×2 IMPLANT
SPONGE LAP 18X18 5 PK (GAUZE/BANDAGES/DRESSINGS) ×2 IMPLANT
SUT MNCRL AB 4-0 PS2 18 (SUTURE) ×4 IMPLANT
SUT PDS AB 1 TP1 54 (SUTURE) ×4 IMPLANT
SUT PROLENE 3 0 SH1 36 (SUTURE) ×8 IMPLANT
SUT PROLENE 4 0 RB 1 (SUTURE) ×4
SUT PROLENE 4-0 RB1 .5 CRCL 36 (SUTURE) ×4 IMPLANT
SUT PROLENE 5 0 C 1 24 (SUTURE) ×12 IMPLANT
SUT PROLENE 5 0 C 1 36 (SUTURE) IMPLANT
SUT SILK 2 0 (SUTURE)
SUT SILK 2 0 TIES 17X18 (SUTURE)
SUT SILK 2 0SH CR/8 30 (SUTURE) ×2 IMPLANT
SUT SILK 2-0 18XBRD TIE 12 (SUTURE) IMPLANT
SUT SILK 2-0 18XBRD TIE BLK (SUTURE) IMPLANT
SUT SILK 3 0 (SUTURE)
SUT SILK 3 0 TIES 17X18 (SUTURE)
SUT SILK 3-0 18XBRD TIE 12 (SUTURE) IMPLANT
SUT SILK 3-0 18XBRD TIE BLK (SUTURE) IMPLANT
SUT VIC AB 2-0 CT1 27 (SUTURE) ×5
SUT VIC AB 2-0 CT1 TAPERPNT 27 (SUTURE) ×5 IMPLANT
SUT VIC AB 3-0 SH 27 (SUTURE) ×4
SUT VIC AB 3-0 SH 27X BRD (SUTURE) ×4 IMPLANT
TOWEL BLUE STERILE X RAY DET (MISCELLANEOUS) ×2 IMPLANT
TOWEL GREEN STERILE (TOWEL DISPOSABLE) ×8 IMPLANT
TRAY FOLEY MTR SLVR 16FR STAT (CATHETERS) ×2 IMPLANT
WATER STERILE IRR 1000ML POUR (IV SOLUTION) ×2 IMPLANT

## 2017-03-24 NOTE — Op Note (Signed)
Patient name: Kristin Gross MRN: [id number] DOB: Dec 25, 1934 Sex: female  03/24/2017 Pre-operative Diagnosis: AAA, chronic kidney disease Post-operative diagnosis:  Same Surgeon:  Eda Paschal. Donzetta Matters, MD Assistant: Gae Gallop, MD  Arlee Muslim, [place] Procedure Performed: Aortobi-iliac bypass with 16 x 8 dacron graft  Indications: 83 year old female with history of chronic kidney disease who was found to have 6.8 cm abdominal aortic aneurysm.  She has been cleared by cardiology.  We discussed the risk and benefits as well as alternatives to proceeding that include possible need for dialysis.  She demonstrates good understanding.  Findings: The aneurysm extended up to just below the renal arteries.  The left renal vein was divided for better access to the infrarenal aorta.  Distally the aorta was very small calcified and likely subtotally occluded.  The common iliac artery on the left was ectatic we were able to sew end-to-end there and on the right there was one soft area for clamping we also sewed end to end there.  At completion she had monophasic dorsalis pedis signals bilaterally.   Procedure:  The patient was identified in the holding area and taken to the operating room where she was placed supine on the operating table and general endotracheal anesthesia was induced.  Monitoring lines been placed in the preoperative holding area.  She was given antibiotics and sterilely prepped and draped usual fashion and timeout called.  We began with midline incision extending to the right of the umbilicus and carried this down to the subcutaneous fat to the fascia.  We divided the fascia in the midline and entered the peritoneum sharply.  This was then taken throughout the entirety of the incision.  We inspected the abdomen where there were adhesions inferiorly from previous operation.  The omental adhesions and then mobilized the entire small bowel.  We did confirm NG tube placement in the stomach as well.   Small bowel was then reflected to the right and we took down the ligament of Treitz and divided the retroperitoneum overlying the abdominal aortic aneurysm.  We ligated the inferior mesenteric vein with ties.  We also ligated several smaller retroperitoneal veins with ties.  We dissected out to the level of the renal vein and bluntly dissected out our renal arteries on both sides where there was only noted to be one main renal artery there was some calcium at the neck of the aneurysm.  We elected to divide the renal vein.  We clamped it on the 2 sides divided sharply and oversewed it with 5-0 Prolene mattress sutures.  We were then able to get a clamp below the renal arteries to the level of the spine.  Then dissected down to the bifurcation and divided our inferior mesenteric artery at the level of the aorta between ties.  There was strong backbleeding from.  We then dissected down toward bifurcation which was noted to be very narrow and calcified.  Our bilateral common iliac arteries were dissected free left being ectatic we are able to place an umbilical tape around this.  Was one soft area on the right common iliac artery for clamping and we placed a vessel loop around this.  At this time the patient was heparinized with full dose heparin.  We then clamped her bilateral common iliac arteries and placed the clamp at the infrarenal aorta which the patient tolerated well.  We then opened our aneurysm sac removed gelatinous material.  We oversewed several lumbar arteries with 2-0 silk suture.  We  keyed off our aneurysm below the renal arteries and then heavily irrigated.  We then sewed our 16 mm Dacron graft and and with 3-0 Prolene suture.  Upon completing the anastomosis we were noted to be hemostatic and we flushed through our graft and then clamped the graft.  We then initially started with her left side and divided our common iliac artery to bring it up into the field.  The graft was then trimmed to size and  spatulated and sewn end-to-end with 4-0 Prolene suture.  Prior to completing we did flush retrograde and flushed through our graft as well.  Upon completion there was a notable drop in the blood pressure anesthesia was able to compensate.  We then turned our attention to the right.  Again similarly divided our common iliac artery to bring it up into the field.  We did have to perform a local endarterectomy there.  The graft was then trimmed to size and sewn end-to-end with 4-0 Prolene and again we flushed prior to completing the anastomosis.  Upon completion we did not have palpable femoral pulses initially but then later could feel it on the left.  We did have monophasic signals at the dorsalis pedis at the feet bilaterally.  Both of her feet appeared well perfused.  With this we gave the patient milligrams of protamine which she tolerated well.  After hemostasis was obtained we irrigated the abdomen closed the aneurysm sac as well as the retroperitoneum with 2-0 Vicryl suture the intestinal contents were returned to the abdomen and we inspected the small bowel as well as the left colon all appeared viable.  We then closed the fascia with #1 PDS suture in place with interrupted 2-0 Vicryls and subcutaneous tissue and closed the skin with 4-0 Monocryl.  Patient was then allowed to awaken from anesthesia having tolerated procedure well without immediate complication.  All counts were correct at completion.  EBL 700 cc.  Cell Saver returned 300 cc.   Emmy Keng C. Donzetta Matters, MD [facility] [place] Office: [phone] Pager: [phone]

## 2017-03-24 NOTE — Plan of Care (Signed)
  Progressing Pain Managment: General experience of comfort will improve 03/24/2017 2047 - Progressing by Netta Corrigan, RN Note Pt is not in any pain.

## 2017-03-24 NOTE — Anesthesia Procedure Notes (Signed)

## 2017-03-24 NOTE — Anesthesia Procedure Notes (Addendum)
Central Venous Catheter Insertion Performed by: Jackson, Carswell, MD, anesthesiologist Start/End3/07/2017 7:06 AM, 03/24/2017 7:22 AM Patient location: Pre-op. Preanesthetic checklist: patient identified, IV checked, risks and benefits discussed, surgical consent, monitors and equipment checked, pre-op evaluation, timeout performed and anesthesia consent Position: Trendelenburg Lidocaine 1% used for infiltration and patient sedated Hand hygiene performed , maximum sterile barriers used  and Seldinger technique used Catheter size: 8.5 Fr PA cath was placed.Sheath introducer Swan type:thermodilution Procedure performed using ultrasound guided technique. Ultrasound Notes:anatomy identified, needle tip was noted to be adjacent to the nerve/plexus identified, no ultrasound evidence of intravascular and/or intraneural injection and image(s) printed for medical record Attempts: 1 Following insertion, line sutured, dressing applied and Biopatch. Post procedure assessment: blood return through all ports, free fluid flow and no air  Patient tolerated the procedure well with no immediate complications. Additional procedure comments: PA catheter:  Routine monitors. Timeout, sterile prep, drape, FBP R neck.  Trendelenburg position.  1% Lido local, finder and trocar RIJ 1st pass with US guidance.  Cordis placed over J wire. PA catheter in easily.  Sterile dressing applied.  Patient tolerated well, VSS.  C Jackson, MD.           

## 2017-03-24 NOTE — Anesthesia Procedure Notes (Signed)
Arterial Line Insertion Start/End3/07/2017 6:55 AM, 03/24/2017 7:00 AM Performed by: Glynda Jaeger, CRNA, CRNA  Preanesthetic checklist: patient identified, IV checked, site marked, risks and benefits discussed, surgical consent, monitors and equipment checked, pre-op evaluation, timeout performed and anesthesia consent Lidocaine 1% used for infiltration Right, radial was placed Catheter size: 20 G Hand hygiene performed  and maximum sterile barriers used   Attempts: 1 Procedure performed without using ultrasound guided technique. Following insertion, dressing applied and Biopatch. Post procedure assessment: normal  Patient tolerated the procedure well with no immediate complications.

## 2017-03-24 NOTE — Transfer of Care (Signed)
Immediate Anesthesia Transfer of Care Note  Patient: Kristin Gross  Procedure(s) Performed: AORTO BI ILIAC BYPASS GRAFT (N/A Abdomen)  Patient Location: PACU  Anesthesia Type:General  Level of Consciousness: awake, alert , oriented, patient cooperative and responds to stimulation  Airway & Oxygen Therapy: Patient Spontanous Breathing and Patient connected to face mask oxygen  Post-op Assessment: Report given to RN, Post -op Vital signs reviewed and stable and Patient moving all extremities X 4  Post vital signs: Reviewed and stable  Last Vitals:  Vitals:   03/24/17 0730 03/24/17 0731  BP:    Pulse: 81 78  Resp: 17 17  Temp:    SpO2: 99% 99%    Last Pain:  Vitals:   03/24/17 0545  TempSrc: Oral      Patients Stated Pain Goal: 3 (89/38/10 1751)  Complications: No apparent anesthesia complications

## 2017-03-24 NOTE — H&P (Signed)
   History and Physical Update  The patient was interviewed and re-examined.  The patient's previous History and Physical has been reviewed and is unchanged from recent office visit. Large AAA with minimal neck. I can palpate femoral pulses today so can potentially remain intraabdominal for repair. Dr. Bettina Gavia has evaluated and given go ahead for surgery.  Aasiyah Auerbach C. Donzetta Matters, MD [facility] [place] Office: [phone] Pager: [phone]   03/24/2017, 7:26 AM

## 2017-03-24 NOTE — Progress Notes (Signed)
Notified Dr. Glennon Mac of patient not taking metoprolol this am (she take usually at 1000).  She had last dose last night and they will give in OR if needed.  Pharmacy stated they are substituting ancef 2 gm for zinacef 1.5 gm.  (zinacef on a back order)

## 2017-03-24 NOTE — Consult Note (Signed)
[facility] Consult Note     Date: 03/24/2017                  Patient Name:  Kristin Gross  MRN: [id number]  DOB: 1934/09/08  Age / Sex: 83 y.o., female         PCP: [name], MD                 Service Requesting Consult: Vascular Surgery- Dr. Servando Snare                 Reason for Consult: Management of CKD in post-op vascular surgery pt            Chief Complaint: s/p aorto bi-iliac bypass for 6.8 cm AAA  HPI: Pt is an 34 F with a PMH significant for HTN, HLD, sig smoking history, and CKD followed by Dr. Moshe Cipro who is now seen in consultation at the request of Dr. Donzetta Matters for management of CKD following the aforementioned surgery for AAA.    Pt was last seen in our office 01/2017.  Her creatinine had been stable at 1.6 as of June 2018.  Her PCP checked her Cr in December and it was 2.2.  HCTZ stopped but Cr continued to trend upwards to 2.9.  When Dr Moshe Cipro saw her January BP was quite high.  UA was bland. Repeat renal US ordered showing size discrepancy R >L and a AAA measuring up to 6.4 cm on the ultrasound.  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  She was seen by Dr. Donzetta Matters fairly quickly and plans were made for the AAA repair which occurred today.  Creatinine was 3.60 03/09/2017 and 4.23 on pre-op labs.  She did fairly well during the case.  She required short-term phenylephrine which was weaned off by the end of the case.  She had about 700 mL EBL with 300 mL Cellsaver returned.  Appears she received about 3.5 L IV fluids during the case.  Aorta was clamped below the level of the renal arteries.    Urine output during the case was 330 mL.  Appears she has made ~ 30 mL since arriving to the ICU.  PAPs have been excellent, reads 15/9 on [place] currently.    She reports no problems except for the sense of having to go to the bathroom.  No pain yet.   Past Medical History:  Diagnosis Date  . AAA (abdominal aortic aneurysm)  ([place])   . Chronic kidney disease   . Hyperlipidemia   . Hypertension   . PONV (postoperative nausea and vomiting)   . Thyroid disease     Past Surgical History:  Procedure Laterality Date  . ABDOMINAL HYSTERECTOMY    . CATARACT EXTRACTION, BILATERAL    . EYE SURGERY      Family History  Problem Relation Age of Onset  . Heart disease Sister   . Heart attack Sister   . Hypertension Sister   . Cancer Mother    Social History:  reports that she has been smoking cigarettes.  She has a 60.00 pack-year smoking history. she has never used smokeless tobacco. She reports that she does not drink alcohol or use drugs.  Allergies:  Allergies  Allergen Reactions  . Lisinopril Swelling    SWELLING REACTION UNSPECIFIED     Medications Prior to Admission  Medication Sig Dispense Refill  . acetaminophen (TYLENOL) 500 MG tablet Take 500 mg by mouth daily as needed  for moderate pain or headache.    Marland Kitchen amLODipine (NORVASC) 5 MG tablet Take 5 mg by mouth daily.    Marland Kitchen aspirin 81 MG tablet Take 81 mg by mouth daily.    . Cholecalciferol (VITAMIN D-3) 1000 units CAPS Take 2,000 Units by mouth daily.     . furosemide (LASIX) 40 MG tablet Take 40 mg by mouth daily.    Marland Kitchen levothyroxine (SYNTHROID, LEVOTHROID) 88 MCG tablet Take 88 mcg by mouth daily before breakfast.    . metoprolol tartrate (LOPRESSOR) 50 MG tablet Take 50 mg by mouth 2 (two) times daily.    . rosuvastatin (CRESTOR) 10 MG tablet Take 10 mg by mouth daily.      Results for orders placed or performed during the hospital encounter of 03/24/17 (from the past 48 hour(s))  Prepare RBC     Status: None   Collection Time: 03/24/17  6:20 AM  Result Value Ref Range   Order Confirmation      ORDER PROCESSED BY BLOOD BANK Performed at [facility], [place] [address], [place], [place] [address]   Prepare RBC     Status: None   Collection Time: 03/24/17  6:35 AM  Result Value Ref Range   Order Confirmation      ORDER PROCESSED BY BLOOD  BANK BB SAMPLE OR UNITS ALREADY AVAILABLE Performed at [facility], [place] [address], [place], [place] [address]   CBC     Status: Abnormal   Collection Time: 03/24/17 12:37 PM  Result Value Ref Range   WBC 20.6 (H) 4.0 - 10.5 K/uL   RBC 3.16 (L) 3.87 - 5.11 MIL/uL   Hemoglobin 10.0 (L) 12.0 - 15.0 g/dL   HCT 29.8 (L) 36.0 - 46.0 %   MCV 94.3 78.0 - 100.0 fL   MCH 31.6 26.0 - 34.0 pg   MCHC 33.6 30.0 - 36.0 g/dL   RDW 13.8 11.5 - 15.5 %   Platelets 170 150 - 400 K/uL    Comment: Performed at [facility] [address] [place], [place] [address]  Basic metabolic panel     Status: Abnormal   Collection Time: 03/24/17 12:37 PM  Result Value Ref Range   Sodium 139 135 - 145 mmol/L   Potassium 3.9 3.5 - 5.1 mmol/L   Chloride 109 101 - 111 mmol/L   CO2 21 (L) 22 - 32 mmol/L   Glucose, Bld 145 (H) 65 - 99 mg/dL   BUN 39 (H) 6 - 20 mg/dL   Creatinine, Ser 3.75 (H) 0.44 - 1.00 mg/dL   Calcium 7.7 (L) 8.9 - 10.3 mg/dL   GFR calc non Af Amer 10 (L) >60 mL/min   GFR calc Af Amer 12 (L) >60 mL/min    Comment: (NOTE) The eGFR has been calculated using the CKD EPI equation. This calculation has not been validated in all clinical situations. eGFR's persistently <60 mL/min signify possible Chronic Kidney Disease.    Anion gap 9 5 - 15    Comment: Performed at [facility] [address] [place], [place] [address]  APTT     Status: None   Collection Time: 03/24/17 12:37 PM  Result Value Ref Range   aPTT 31 24 - 36 seconds    Comment: Performed at [facility] [address], [place], [place] [address]   Dg Chest Port 1 View  Result Date: 03/24/2017 CLINICAL DATA:  AAA repair EXAM: PORTABLE CHEST 1 VIEW  COMPARISON:  09/21/2011 FINDINGS: There is diffuse bilateral interstitial thickening. There is a possible trace left pleural effusion. There is no right pleural effusion. There is no pneumothorax. There is stable cardiomegaly. There is thoracic aortic  atherosclerosis. There is a right-sided Swan-Ganz catheter with the tip projecting over the right ventricular outflow tract. There is a nasogastric tube coursing below the diaphragm. The osseous structures are unremarkable. IMPRESSION: 1. Cardiomegaly with mild pulmonary vascular congestion. 2. Swan-Ganz catheter with the tip projecting over the right ventricular outflow tract. Electronically Signed   By: Kathreen Devoid   On: 03/24/2017 12:58    ROS: all other systems reviewed and are negative except as per HPI  Blood pressure (!) 102/58, pulse 68, temperature 97.8 F (36.6 C), temperature source Oral, resp. rate 19, SpO2 99 %. Physical Exam  GEN older woman, NAD, lying flat in bed HEENT EOMI, PERRL NECK R IJ Swann in place PULM normal WOB, clear bilaterally no c/w/r CV RRR soft systolic murmur ABD hypoactive bowel sounds, midline incision to the R of the umbilicus well-approximated with no drainage EXT no LE edema, bilateral LEs are warm without mottling  NEURO AAO x 3 SKIN without rashes  Assessment/Plan  1.  CKD: Follows with Dr. Moshe Cipro. Likely ischemic nephropathy as initial and then repeat workup unrevealing.  Prior baseline creatinine was 1.6 and had uptrend to > 4 preoperatively.  Postop is 3.75, K 3.9.  No acute indications for dialysis at present but will continue to monitor closely.  If UOP doesn't pick up in the next day or so, may have to support with dialysis.  Pt is aware of this and is willing to proceed if necessary.    2.  AAA s/p aorto bi-iliac bypass: underwent repair 3/7 with Dr. Donzetta Matters.  Per primary  3.  HTN: normotensive at present.  Briefly on phenylephrine during repair.  Monitor.  4.  Dispo: Remains in ICU for now.   Madelon Lips, MD [facility] pgr [phone] 03/24/2017, 4:14 PM

## 2017-03-25 ENCOUNTER — Encounter (HOSPITAL_COMMUNITY): Payer: Self-pay | Admitting: Vascular Surgery

## 2017-03-25 LAB — GLUCOSE, CAPILLARY
GLUCOSE-CAPILLARY: 124 mg/dL — AB (ref 65–99)
GLUCOSE-CAPILLARY: 116 mg/dL — AB (ref 65–99)
GLUCOSE-CAPILLARY: 101 mg/dL — AB (ref 65–99)
Glucose-Capillary: 113 mg/dL — ABNORMAL HIGH (ref 65–99)
Glucose-Capillary: 117 mg/dL — ABNORMAL HIGH (ref 65–99)

## 2017-03-25 LAB — CBC
HCT: 26.4 % — ABNORMAL LOW (ref 36.0–46.0)
HEMOGLOBIN: 8.7 g/dL — AB (ref 12.0–15.0)
MCH: 31.4 pg (ref 26.0–34.0)
MCHC: 33 g/dL (ref 30.0–36.0)
MCV: 95.3 fL (ref 78.0–100.0)
PLATELETS: 145 10*3/uL — AB (ref 150–400)
RBC: 2.77 MIL/uL — AB (ref 3.87–5.11)
RDW: 13.9 % (ref 11.5–15.5)
WBC: 10.9 10*3/uL — AB (ref 4.0–10.5)

## 2017-03-25 LAB — POCT I-STAT 7, (LYTES, BLD GAS, ICA,H+H)
ACID-BASE DEFICIT: 2 mmol/L (ref 0.0–2.0)
Acid-base deficit: 2 mmol/L (ref 0.0–2.0)
Bicarbonate: 22.9 mmol/L (ref 20.0–28.0)
Bicarbonate: 23.7 mmol/L (ref 20.0–28.0)
CALCIUM ION: 1.08 mmol/L — AB (ref 1.15–1.40)
Calcium, Ion: 1.08 mmol/L — ABNORMAL LOW (ref 1.15–1.40)
HCT: 33 % — ABNORMAL LOW (ref 36.0–46.0)
HCT: 27 % — ABNORMAL LOW (ref 36.0–46.0)
HEMOGLOBIN: 11.2 g/dL — AB (ref 12.0–15.0)
Hemoglobin: 9.2 g/dL — ABNORMAL LOW (ref 12.0–15.0)
O2 SAT: 100 %
O2 Saturation: 100 %
PCO2 ART: 40 mmHg (ref 32.0–48.0)
PCO2 ART: 38.7 mmHg (ref 32.0–48.0)
PO2 ART: 282 mmHg — AB (ref 83.0–108.0)
POTASSIUM: 3.3 mmol/L — AB (ref 3.5–5.1)
Patient temperature: 35.6
Potassium: 3.9 mmol/L (ref 3.5–5.1)
SODIUM: 141 mmol/L (ref 135–145)
Sodium: 140 mmol/L (ref 135–145)
TCO2: 24 mmol/L (ref 22–32)
TCO2: 25 mmol/L (ref 22–32)
pH, Arterial: 7.366 (ref 7.350–7.450)
pH, Arterial: 7.389 (ref 7.350–7.450)
pO2, Arterial: 249 mmHg — ABNORMAL HIGH (ref 83.0–108.0)

## 2017-03-25 LAB — PROTIME-INR
INR: 1.16
PROTHROMBIN TIME: 14.7 s (ref 11.4–15.2)

## 2017-03-25 LAB — BASIC METABOLIC PANEL
ANION GAP: 12 (ref 5–15)
BUN: 44 mg/dL — ABNORMAL HIGH (ref 6–20)
CHLORIDE: 108 mmol/L (ref 101–111)
CO2: 21 mmol/L — ABNORMAL LOW (ref 22–32)
Calcium: 7.6 mg/dL — ABNORMAL LOW (ref 8.9–10.3)
Creatinine, Ser: 4.24 mg/dL — ABNORMAL HIGH (ref 0.44–1.00)
GFR calc Af Amer: 10 mL/min — ABNORMAL LOW (ref >60)
GFR, EST NON AFRICAN AMERICAN: 9 mL/min — AB (ref >60)
Glucose, Bld: 120 mg/dL — ABNORMAL HIGH (ref 65–99)
POTASSIUM: 4.1 mmol/L (ref 3.5–5.1)
SODIUM: 141 mmol/L (ref 135–145)

## 2017-03-25 LAB — MAGNESIUM: MAGNESIUM: 1.7 mg/dL (ref 1.7–2.4)

## 2017-03-25 MED ORDER — ORAL CARE MOUTH RINSE
15.0000 mL | Freq: Two times a day (BID) | OROMUCOSAL | Status: DC
  Administered 2017-03-26 – 2017-03-27 (×2): 15 mL via OROMUCOSAL

## 2017-03-25 MED ORDER — SODIUM CHLORIDE 0.9% FLUSH: 10.0000 mL | Freq: Two times a day (BID) | INTRAVENOUS | Status: DC

## 2017-03-25 MED ORDER — SODIUM CHLORIDE 0.9% FLUSH: 10.0000 mL | INTRAVENOUS | Status: DC | PRN

## 2017-03-25 MED ORDER — CHLORHEXIDINE GLUCONATE CLOTH 2 % EX PADS
6.0000 | MEDICATED_PAD | Freq: Every day | CUTANEOUS | Status: DC
  Administered 2017-03-25: 6 via TOPICAL

## 2017-03-25 NOTE — Progress Notes (Signed)
[facility] Rounding Note  Subjective/Interval History:  BP stable UOP 680 since the case About 340 overnight Fluids going at 100 About 5L + fluid wise  Says she has no pain or SOB  Objective Vital signs in last 24 hours: Vitals:   03/25/17 0400 03/25/17 0500 03/25/17 0600 03/25/17 0700  BP: 140/60 131/72 139/63 133/60  Pulse: 83 89 93 92  Resp: (!) 23 19 18 15   Temp: 98.6 F (37 C)   98.1 F (36.7 C)  TempSrc: Oral   Oral  SpO2: 95% 97% 94% 98%  Weight:   78.4 kg (172 lb 13.5 oz)    Weight change:   Intake/Output Summary (Last 24 hours) at 03/25/2017 0852 Last data filed at 03/25/2017 0600 Gross per 24 hour  Intake 6433.33 ml  Output 1280 ml  Net 5153.33 ml   Physical Exam:  Blood pressure 133/60, pulse 92, temperature 98.1 F (36.7 C), temperature source Oral, resp. rate 15, weight 78.4 kg (172 lb 13.5 oz), SpO2 98 %.  Very nice older WF Daughter in room with her R IJ Swan, foley, NG Mild periorbital edema Lungs decr BS but ant clear, no wheezing S1S2 No S3 Abd long midline surgical scar incisions clean and dry Few BS Appropriate tenderness No LE edema No stigmata of atheroemboli but feet cool  Recent Labs  Lab 03/21/17 1345 03/24/17 1125 03/24/17 1237 03/25/17 0419  NA 139 140 139 141  K 3.9 3.9 3.9 4.1  CL 103  --  109 108  CO2 21*  --  21* 21*  GLUCOSE 92  --  145* 120*  BUN 49*  --  39* 44*  CREATININE 4.23*  --  3.75* 4.24*  CALCIUM 9.2  --  7.7* 7.6*    Recent Labs  Lab 03/21/17 1345  AST 18  ALT 12*  ALKPHOS 72  BILITOT 0.5  PROT 6.6  ALBUMIN 3.7    Recent Labs  Lab 03/21/17 1345 03/24/17 1125 03/24/17 1237 03/25/17 0419  WBC 12.3*  --  20.6* 10.9*  HGB 14.4 9.2* 10.0* 8.7*  HCT 42.9 27.0* 29.8* 26.4*  MCV 93.1  --  94.3 95.3  PLT 258  --  170 145*    Recent Labs  Lab 03/25/17 0751  GLUCAP 113*    Studies/Results: Dg Chest Port 1 View  Result Date: 03/24/2017 CLINICAL DATA:  AAA repair EXAM: PORTABLE CHEST 1 VIEW COMPARISON:   09/21/2011 FINDINGS: There is diffuse bilateral interstitial thickening. There is a possible trace left pleural effusion. There is no right pleural effusion. There is no pneumothorax. There is stable cardiomegaly. There is thoracic aortic atherosclerosis. There is a right-sided Swan-Ganz catheter with the tip projecting over the right ventricular outflow tract. There is a nasogastric tube coursing below the diaphragm. The osseous structures are unremarkable. IMPRESSION: 1. Cardiomegaly with mild pulmonary vascular congestion. 2. Swan-Ganz catheter with the tip projecting over the right ventricular outflow tract. Electronically Signed   By: Kathreen Devoid   On: 03/24/2017 12:58   Medications: . sodium chloride    . sodium chloride 100 mL/hr at 03/25/17 0100  . magnesium sulfate 1 - 4 g bolus IVPB     . chlorhexidine  15 mL Mouth Rinse BID  . docusate sodium  100 mg Oral Daily  . heparin  5,000 Units Subcutaneous Q8H  . mouth rinse  15 mL Mouth Rinse q12n4p  . pantoprazole  40 mg Oral Daily   Background:  Pt followed by Dr. Moshe Cipro. Baseline creatinine 1.23 June 2016. 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 Creatinine started trending ^ 12/2016 2.2, then 2.9.  Renal US  - size discrepancy atrophic R and AAA. CT scan 7.3 cm AAA, extensive athero,  Multiple bilateral renal cysts, AAA repair (aortoiliac bypass) by Dr. Donzetta Matters 3/7. Creatinine 4.23 on pre-op labs. Transient intra op phenylephrine. 700 EBL with 300 cell saver returned. 3.5 L fluids. Aorta clamped below renals.   Assessment/Plan  1. CKD: Follows with Dr. Moshe Cipro. Likely ischemic nephropathy as initial etiology  1. Prior baseline creatinine 1.6 and had uptrended to  4.23 preoperatively w/recognition of the AAA.   2. Post op 3.75 (? diluted) 4.24 today so essentially at pre op level right now.  3. Making some urine, K and acid base are OK so far, vol up but not acute issue  4. No acute indications for dialysis  at present but will continue to monitor closely.  5. Change IVF to [facility].   6. If UOP doesn't pick up in the next day or so, may have to support with dialysis.   7. Pt is aware of this and is willing to proceed if necessary.   2. AAA s/p aorto bi-iliac bypass: underwent repair 3/7 with Dr. Donzetta Matters.  Per primary 3. HTN: normotensive at present.  Briefly on phenylephrine during repair.  Monitor. 4. ABLA - Hb down 14.4 pre op ->8.7. Monitor for transfusion need.  Jamal Maes, MD [facility] [phone] Pager 03/25/2017, 9:01 AM

## 2017-03-25 NOTE — Progress Notes (Addendum)
[facility] [place]  Subjective  - Doing well over all considering the magnitude of surgery.     Objective 133/60 92 98.6 F (37 C) (Oral) 15 98%  Intake/Output Summary (Last 24 hours) at 03/25/2017 0730 Last data filed at 03/25/2017 0600 Gross per 24 hour  Intake 6433.33 ml  Output 1530 ml  Net 4903.33 ml    Gen NAD alert and oriented x 3 Doppler DP B, feet warm and well perfused Abdomin soft, NG tube in place 150 cc out put since surgery Heart RRR rate 102 Lungs non labored breathing O2 [place] 3 L SAT 98%  Assessment/Planning: POD # 1 Aortobi-iliac bypass with 16 x 8 dacron graft  CKD UO 250 cc will maintain foley until Nephrology consults for astrict I & O's Cr 3.75 increased to 4.24.   Maintain NG tube will re check out put later. Ice chips OK slow D/C swan. HGB 8.7 EBL 700 cc intraoperative. We will follow.   OOB to chair mobility as tolerates. Known Non palpable Femoral and pedal pulses likely due to calcification of arteries.  Maintained doppler signals in B LE.  No ischemic or complaints of claudication to date.   Keep in ICU another night for close observation.  Roxy Horseman 03/25/2017 7:30 AM --  Laboratory Lab Results: Recent Labs    03/24/17 1237 03/25/17 0419  WBC 20.6* 10.9*  HGB 10.0* 8.7*  HCT 29.8* 26.4*  PLT 170 145*   BMET Recent Labs    03/24/17 1237 03/25/17 0419  NA 139 141  K 3.9 4.1  CL 109 108  CO2 21* 21*  GLUCOSE 145* 120*  BUN 39* 44*  CREATININE 3.75* 4.24*  CALCIUM 7.7* 7.6*    COAG Lab Results  Component Value Date   INR 1.16 03/25/2017   INR 0.95 03/21/2017   No results found for: PTT    I have interviewed and examined patient with PA and agree with assessment and plan above. Overall doing well without evidence of malperfusion of lower extremities. Ok for ice chips and will check ng tube output later today with possible clamping trial today. She needs to be oob. Will keep foley  catheter in place. Was making adequate urine over night, appreciate nephrology assistance.   Brandon C. Donzetta Matters, MD [facility] [place] Office: [phone] Pager: [phone]

## 2017-03-25 NOTE — Progress Notes (Signed)
Pt refusing pain medicine before moving to the chair. Upon dangling at the bedside and standing up with (bearhug assist) pt became very nauseous. Pt transferred to recliner (stand and pivot). Pt HR up to 140's with transfer. Zofran given with immediate relief. Pt now resting in recliner. Will continue to monitor closely.  Lucius Conn, RN

## 2017-03-25 NOTE — Anesthesia Postprocedure Evaluation (Signed)
Anesthesia Post Note  Patient: Kristin Gross  Procedure(s) Performed: AORTO BI ILIAC BYPASS GRAFT (N/A Abdomen)     Patient location during evaluation: PACU Anesthesia Type: General Level of consciousness: awake and alert Pain management: pain level controlled Vital Signs Assessment: post-procedure vital signs reviewed and stable Respiratory status: spontaneous breathing, nonlabored ventilation, respiratory function stable and patient connected to nasal cannula oxygen Cardiovascular status: blood pressure returned to baseline and stable Postop Assessment: no apparent nausea or vomiting Anesthetic complications: no    Last Vitals:  Vitals:   03/25/17 0900 03/25/17 1000  BP: 126/85 (!) 128/58  Pulse: 94 93  Resp: 20 16  Temp:    SpO2: 96% 97%    Last Pain:  Vitals:   03/25/17 0800  TempSrc:   PainSc: 0-No pain                 Ly Bacchi

## 2017-03-26 LAB — GLUCOSE, CAPILLARY
GLUCOSE-CAPILLARY: 102 mg/dL — AB (ref 65–99)
GLUCOSE-CAPILLARY: 96 mg/dL (ref 65–99)
GLUCOSE-CAPILLARY: 91 mg/dL (ref 65–99)
Glucose-Capillary: 83 mg/dL (ref 65–99)

## 2017-03-26 LAB — RENAL FUNCTION PANEL
ANION GAP: 14 (ref 5–15)
Albumin: 2.6 g/dL — ABNORMAL LOW (ref 3.5–5.0)
BUN: 50 mg/dL — ABNORMAL HIGH (ref 6–20)
CALCIUM: 8 mg/dL — AB (ref 8.9–10.3)
CO2: 20 mmol/L — ABNORMAL LOW (ref 22–32)
CREATININE: 4.95 mg/dL — AB (ref 0.44–1.00)
Chloride: 109 mmol/L (ref 101–111)
GFR, EST AFRICAN AMERICAN: 9 mL/min — AB (ref >60)
GFR, EST NON AFRICAN AMERICAN: 7 mL/min — AB (ref >60)
Glucose, Bld: 106 mg/dL — ABNORMAL HIGH (ref 65–99)
PHOSPHORUS: 6 mg/dL — AB (ref 2.5–4.6)
Potassium: 3.9 mmol/L (ref 3.5–5.1)
SODIUM: 143 mmol/L (ref 135–145)

## 2017-03-26 LAB — CBC
HCT: 21.6 % — ABNORMAL LOW (ref 36.0–46.0)
HEMOGLOBIN: 7 g/dL — AB (ref 12.0–15.0)
MCH: 31 pg (ref 26.0–34.0)
MCHC: 32.4 g/dL (ref 30.0–36.0)
MCV: 95.6 fL (ref 78.0–100.0)
PLATELETS: 129 10*3/uL — AB (ref 150–400)
RBC: 2.26 MIL/uL — AB (ref 3.87–5.11)
RDW: 14 % (ref 11.5–15.5)
WBC: 14.5 10*3/uL — AB (ref 4.0–10.5)

## 2017-03-26 LAB — HEMOGLOBIN AND HEMATOCRIT, BLOOD
HCT: 30.9 % — ABNORMAL LOW (ref 36.0–46.0)
HEMOGLOBIN: 9.9 g/dL — AB (ref 12.0–15.0)

## 2017-03-26 LAB — PREPARE RBC (CROSSMATCH)

## 2017-03-26 MED ORDER — FUROSEMIDE 10 MG/ML IJ SOLN
80.0000 mg | Freq: Once | INTRAMUSCULAR | Status: AC
  Administered 2017-03-26: 80 mg via INTRAVENOUS
  Filled 2017-03-26: qty 8

## 2017-03-26 MED ORDER — SODIUM CHLORIDE 0.9 % IV SOLN
Freq: Once | INTRAVENOUS | Status: AC
  Administered 2017-03-26: 09:00:00 via INTRAVENOUS

## 2017-03-26 MED ORDER — MORPHINE SULFATE (PF) 2 MG/ML IV SOLN
1.0000 mg | INTRAVENOUS | Status: DC | PRN
  Administered 2017-03-26: 1 mg via INTRAVENOUS
  Filled 2017-03-26: qty 1

## 2017-03-26 NOTE — Progress Notes (Signed)
[facility] Rounding Note  Subjective/Interval History:  BP stable Hb down - for transfusion Making urine though not tremendous amounts Creatinine rising  Objective Vital signs in last 24 hours: Vitals:   03/26/17 0825 03/26/17 0830 03/26/17 0900 03/26/17 0919  BP:   (!) 155/55   Pulse:  (!) 117 (!) 115 (!) 112  Resp:  (!) 23 (!) 24 (!) 27  Temp: 98.4 F (36.9 C)   98.4 F (36.9 C)  TempSrc: Axillary   Oral  SpO2:  94% 95% 96%  Weight:       Weight change: -0.5 kg (-1.6 oz)  Intake/Output Summary (Last 24 hours) at 03/26/2017 0930 Last data filed at 03/26/2017 0500 Gross per 24 hour  Intake 200 ml  Output 1035 ml  Net -835 ml   Physical Exam:  Blood pressure (!) 155/55, pulse (!) 112, temperature 98.4 F (36.9 C), temperature source Oral, resp. rate (!) 27, weight 77.9 kg (171 lb 11.8 oz), SpO2 96 %.  Getting blood NG out Looks comfortable, NAD Lungs decr BS but ant clear, no wheezing S1S2 No S3 Abd long midline surgical scar incisions clean and dry + BS No LE edema SCD's in place No stigmata of atheroemboli   Recent Labs  Lab 03/21/17 1345 03/24/17 0935 03/24/17 1125 03/24/17 1237 03/25/17 0419 03/26/17 0356  NA 139 141 140 139 141 143  K 3.9 3.3* 3.9 3.9 4.1 3.9  CL 103  --   --  109 108 109  CO2 21*  --   --  21* 21* 20*  GLUCOSE 92  --   --  145* 120* 106*  BUN 49*  --   --  39* 44* 50*  CREATININE 4.23*  --   --  3.75* 4.24* 4.95*  CALCIUM 9.2  --   --  7.7* 7.6* 8.0*  PHOS  --   --   --   --   --  6.0*    Recent Labs  Lab 03/21/17 1345 03/26/17 0356  AST 18  --   ALT 12*  --   ALKPHOS 72  --   BILITOT 0.5  --   PROT 6.6  --   ALBUMIN 3.7 2.6*    Recent Labs  Lab 03/21/17 1345  03/24/17 1125 03/24/17 1237 03/25/17 0419 03/26/17 0356  WBC 12.3*  --   --  20.6* 10.9* 14.5*  HGB 14.4   < > 9.2* 10.0* 8.7* 7.0*  HCT 42.9   < > 27.0* 29.8* 26.4* 21.6*  MCV 93.1  --   --  94.3 95.3 95.6  PLT 258  --   --  170 145* 129*   < > = values in this  interval not displayed.    Recent Labs  Lab 03/25/17 1548 03/25/17 1953 03/25/17 2322 03/26/17 0259 03/26/17 0823  GLUCAP 116* 117* 101* 102* 96    Studies/Results: Dg Chest Port 1 View  Result Date: 03/24/2017 CLINICAL DATA:  AAA repair EXAM: PORTABLE CHEST 1 VIEW COMPARISON:  09/21/2011 FINDINGS: There is diffuse bilateral interstitial thickening. There is a possible trace left pleural effusion. There is no right pleural effusion. There is no pneumothorax. There is stable cardiomegaly. There is thoracic aortic atherosclerosis. There is a right-sided Swan-Ganz catheter with the tip projecting over the right ventricular outflow tract. There is a nasogastric tube coursing below the diaphragm. The osseous structures are unremarkable. IMPRESSION: 1. Cardiomegaly with mild pulmonary vascular congestion. 2. Swan-Ganz catheter with the tip projecting over the right ventricular outflow  tract. Electronically Signed   By: Kathreen Devoid   On: 03/24/2017 12:58   Medications: . sodium chloride    . magnesium sulfate 1 - 4 g bolus IVPB     . chlorhexidine  15 mL Mouth Rinse BID  . Chlorhexidine Gluconate Cloth  6 each Topical Daily  . docusate sodium  100 mg Oral Daily  . heparin  5,000 Units Subcutaneous Q8H  . mouth rinse  15 mL Mouth Rinse q12n4p  . mouth rinse  15 mL Mouth Rinse BID  . pantoprazole  40 mg Oral Daily  . sodium chloride flush  10-40 mL Intracatheter Q12H   Background:  Pt followed by Dr. Moshe Cipro. Baseline creatinine 1.23 June 2016. 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 Creatinine started trending ^ 12/2016 2.2, then 2.9.  Renal US  - size discrepancy atrophic R and AAA. CT scan 7.3 cm AAA, extensive athero,  Multiple bilateral renal cysts, AAA repair (aortoiliac bypass) by Dr. Donzetta Matters 3/7. Creatinine 4.23 pre-op. Transient intra op need for phenylephrine. 700 EBL with 300 cell saver returned. 3.5 L fluids. Aorta clamped below renals. Post op AKI    Assessment/Plan  1. CKD: Follows with Dr. Moshe Cipro. Likely ischemic nephropathy as initial etiology  1. Prior baseline creatinine 1.6 and had uptrended to  4.23 preoperatively w/recognition of the AAA.   2. Post op 3.75 (? diluted) rising past 24 hours.  3. Making some urine, K and acid base are OK so far, vol up but not acute issue  4. No acute indications for dialysis at present but will continue to monitor closely.    5. Will give IV lasix with transfusion 80 mg.     2. AAA s/p aorto bi-iliac bypass: s/p repair 3/7 with Dr. Donzetta Matters.  3. HTN: normotensive at present.  Briefly on phenylephrine during repair.  Monitor. 4. ABLA - Hb down 14.4 pre op ->8.7->7. For 2 units today.   Jamal Maes, MD [facility] [phone] Pager 03/26/2017, 9:30 AM

## 2017-03-26 NOTE — Progress Notes (Signed)
Subjective: Interval History: none.. Alert oriented.  Family present in the room with her.  Reported some nausea with mobilization.  Objective: Vital signs in last 24 hours: Temp:  [97.6 F (36.4 C)-98.8 F (37.1 C)] 98.7 F (37.1 C) (03/09 0324) Pulse Rate:  [90-140] 107 (03/09 0700) Resp:  [16-28] 23 (03/09 0700) BP: (117-158)/(45-85) 141/75 (03/09 0700) SpO2:  [88 %-99 %] 97 % (03/09 0700) Weight:  [171 lb 11.8 oz (77.9 kg)] 171 lb 11.8 oz (77.9 kg) (03/09 0500)  Intake/Output from previous day: 03/08 0701 - 03/09 0700 In: 200  Out: 1085 [Urine:535; Emesis/NG output:550] Intake/Output this shift: No intake/output data recorded.  Incision healing nicely.  Mild abdominal soreness.  2-3+ left dorsalis pedis pulse and audible right dorsalis pedis signal by Doppler  Lab Results: Recent Labs    03/25/17 0419 03/26/17 0356  WBC 10.9* 14.5*  HGB 8.7* 7.0*  HCT 26.4* 21.6*  PLT 145* 129*   BMET Recent Labs    03/25/17 0419 03/26/17 0356  NA 141 143  K 4.1 3.9  CL 108 109  CO2 21* 20*  GLUCOSE 120* 106*  BUN 44* 50*  CREATININE 4.24* 4.95*  CALCIUM 7.6* 8.0*    Studies/Results: Ct Abdomen Pelvis Wo Contrast  Result Date: 03/09/2017 CLINICAL DATA:  Abdominal aortic aneurysm EXAM: CT ABDOMEN AND PELVIS WITHOUT CONTRAST TECHNIQUE: Multidetector CT imaging of the abdomen and pelvis was performed following the standard protocol without IV contrast. COMPARISON:  None. FINDINGS: Lower chest: There is fibrosis and atelectatic change in the lung bases. There are foci of coronary artery calcification. Visualized pericardium does not appear thickened. Hepatobiliary: No focal liver lesions are evident on this noncontrast enhanced study. There is cholelithiasis within the gallbladder. Gallbladder is somewhat contracted. No pericholecystic fluid. No biliary duct dilatation. Pancreas: No pancreatic mass or inflammatory focus. Spleen: No splenic lesions are evident. Adrenals/Urinary  Tract: Right adrenal appears normal. There is an adenoma in the left adrenal measuring 1.9 x 1.7 cm. There are multiple cysts in each kidney. The largest cyst on the right is located posteriorly in the midportion of the right kidney measuring 3.1 x 2.7 cm. A cyst arising eccentrically from the upper pole of the right kidney measures 2 x 2 cm. The largest cysts on the left measure 3.6 x 2.9 cm and 4.3 x 3.8 cm. A cyst arising from the midportion of the left kidney measures 2.7 x 2.7 cm. Scattered smaller cysts are also evident on both sides. There is no hydronephrosis on either side. There is no renal or ureteral calculus on either side. Urinary bladder is midline with wall thickness within normal limits. Note that there is a small amount of air within the urinary bladder. Stomach/Bowel: There are multiple sigmoid diverticula without diverticulitis. Scattered diverticula noted elsewhere throughout the colon. There is no appreciable bowel wall or mesenteric thickening. No bowel obstruction. No free air or portal venous air evident. Vascular/Lymphatic: There is aortic ectasia with diffuse calcification throughout the aorta. There is also extensive iliac artery calcification bilaterally. There appears to be hemodynamically significant narrowing in each common iliac artery proximally. Foci of calcification are also noted in each external iliac artery. There is aneurysmal dilatation of the abdominal aorta. This aneurysm arises slightly inferior to the renal arteries and terminates slightly superior to the bifurcation. At its maximum, this aneurysm has a transverse diameter of 6.9 cm from right to left dimension and 6.0 cm from anterior to posterior dimension. This aneurysm has a length from superior to  inferior dimension of 7.3 cm. There is no periaortic fluid. There is turbulent flow of blood seen within the aneurysm. There is moderate atherosclerotic calcification in major mesenteric arterial vessels. No major  mesenteric arterial obstruction evident on this noncontrast enhanced study. There is no adenopathy in the abdomen or pelvis. Reproductive: Uterus is absent.  No pelvic mass evident. Other: Appendix appears normal. No ascites or abscess evident in the abdomen pelvis. There is a small ventral hernia containing only fat. Musculoskeletal: There is degenerative change in the lumbar spine. There are no blastic or lytic bone lesions. There is no intramuscular or abdominal wall lesion evident. IMPRESSION: 1. Infrarenal abdominal aortic aneurysm measuring 7.3 cm in length. The transverse diameter of this aneurysm is 6.9 cm from right to left dimension and 6.0 cm from anterior to posterior dimension. No periaortic fluid. Aneurysm terminates slightly above the bifurcation. 2. Extensive aortoiliac atherosclerosis. Note that there are foci of hemodynamically significant obstruction in each common iliac artery based on degree of calcification present. There also foci of coronary artery calcification. 3. There is a small amount of air in the urinary bladder. If patient has not been recently catheterized, this finding is concerning for potential gas-forming organism within the urinary bladder. Urinalysis correlation in this regard advised. 4. Cholelithiasis. Gallbladder is somewhat contracted. No pericholecystic fluid. 5. Extensive colonic diverticulosis, mainly in the sigmoid region. No diverticulitis. No bowel obstruction. 6.  Benign left adrenal adenoma. 7.  Small ventral hernia containing only fat. 8.  Fibrotic change in lung bases. 9.  Uterus absent. 9. Multiple renal cysts. No hydronephrosis. No renal or ureteral calculus. Aortic aneurysm NOS (ICD10-I71.9). Aortic Atherosclerosis (ICD10-I70.0). Electronically Signed   By: Lowella Grip III M.D.   On: 03/09/2017 10:23   Dg Chest Port 1 View  Result Date: 03/24/2017 CLINICAL DATA:  AAA repair EXAM: PORTABLE CHEST 1 VIEW COMPARISON:  09/21/2011 FINDINGS: There is diffuse  bilateral interstitial thickening. There is a possible trace left pleural effusion. There is no right pleural effusion. There is no pneumothorax. There is stable cardiomegaly. There is thoracic aortic atherosclerosis. There is a right-sided Swan-Ganz catheter with the tip projecting over the right ventricular outflow tract. There is a nasogastric tube coursing below the diaphragm. The osseous structures are unremarkable. IMPRESSION: 1. Cardiomegaly with mild pulmonary vascular congestion. 2. Swan-Ganz catheter with the tip projecting over the right ventricular outflow tract. Electronically Signed   By: Kathreen Devoid   On: 03/24/2017 12:58   Anti-infectives: Anti-infectives (From admission, onward)   Start     Dose/Rate Route Frequency Ordered Stop   03/24/17 1345  ceFAZolin (ANCEF) IVPB 2g/100 mL premix     2 g 200 mL/hr over 30 Minutes Intravenous Every 8 hours 03/24/17 1344 03/24/17 2214   03/24/17 0652  ceFAZolin (ANCEF) 2-4 GM/100ML-% IVPB    Comments:  Leandrew Koyanagi   : cabinet override      03/24/17 1610 03/24/17 1859   03/24/17 0632  cefUROXime (ZINACEF) 1.5 g in sodium chloride 0.9 % 100 mL IVPB  Status:  Discontinued     1.5 g 200 mL/hr over 30 Minutes Intravenous 30 min pre-op 03/24/17 9604 03/24/17 1336      Assessment/Plan: s/p Procedure(s): AORTO BI ILIAC BYPASS GRAFT (N/A) Stable overall.  280 cc urine output over the night shift.  Creatinine up to 4.9.  H&H down to 7 and 21.  Will transfuse 2 units packed red blood cells.  Will DC NG tube.  Continue to mobilize.   LOS:  2 days   Sherren Mocha Eldonna Neuenfeldt 03/26/2017, 8:33 AM

## 2017-03-26 NOTE — Progress Notes (Signed)
Encouraged patient to cough and deep breath, pillow for support. She does not have pain " until I breathe in deeply" discussed atalectasis increased risk of pneumonia from not coughing and deep breathing/ having adequete pain control to do these things. Patient agreed to have 1 mg of morphine. IS only to 250-500. Will re-evaluate after Morphine

## 2017-03-26 NOTE — Progress Notes (Signed)
Improvement in IS. Up to chair with assistance and tolerated. No c/o dizziness. Pillow splinted abdomen

## 2017-03-27 ENCOUNTER — Other Ambulatory Visit: Payer: Self-pay

## 2017-03-27 ENCOUNTER — Encounter (HOSPITAL_COMMUNITY): Payer: Self-pay

## 2017-03-27 LAB — CBC
HCT: 29.5 % — ABNORMAL LOW (ref 36.0–46.0)
HEMOGLOBIN: 9.9 g/dL — AB (ref 12.0–15.0)
MCH: 30.3 pg (ref 26.0–34.0)
MCHC: 33.6 g/dL (ref 30.0–36.0)
MCV: 90.2 fL (ref 78.0–100.0)
Platelets: 124 10*3/uL — ABNORMAL LOW (ref 150–400)
RBC: 3.27 MIL/uL — AB (ref 3.87–5.11)
RDW: 16.6 % — ABNORMAL HIGH (ref 11.5–15.5)
WBC: 12.2 10*3/uL — ABNORMAL HIGH (ref 4.0–10.5)

## 2017-03-27 LAB — RENAL FUNCTION PANEL
ANION GAP: 13 (ref 5–15)
Albumin: 2.4 g/dL — ABNORMAL LOW (ref 3.5–5.0)
BUN: 50 mg/dL — ABNORMAL HIGH (ref 6–20)
CALCIUM: 8.2 mg/dL — AB (ref 8.9–10.3)
CO2: 22 mmol/L (ref 22–32)
Chloride: 108 mmol/L (ref 101–111)
Creatinine, Ser: 5.02 mg/dL — ABNORMAL HIGH (ref 0.44–1.00)
GFR calc non Af Amer: 7 mL/min — ABNORMAL LOW (ref >60)
GFR, EST AFRICAN AMERICAN: 9 mL/min — AB (ref >60)
Glucose, Bld: 93 mg/dL (ref 65–99)
POTASSIUM: 3.8 mmol/L (ref 3.5–5.1)
Phosphorus: 6 mg/dL — ABNORMAL HIGH (ref 2.5–4.6)
SODIUM: 143 mmol/L (ref 135–145)

## 2017-03-27 MED ORDER — ROSUVASTATIN CALCIUM 10 MG PO TABS
10.0000 mg | ORAL_TABLET | Freq: Every day | ORAL | Status: DC
  Administered 2017-03-27 – 2017-03-31 (×5): 10 mg via ORAL
  Filled 2017-03-27 (×6): qty 1

## 2017-03-27 MED ORDER — METOPROLOL TARTRATE 50 MG PO TABS: 50.0000 mg | ORAL_TABLET | Freq: Two times a day (BID) | ORAL | Status: DC

## 2017-03-27 MED ORDER — AMLODIPINE BESYLATE 5 MG PO TABS: 5.0000 mg | ORAL_TABLET | Freq: Every day | ORAL | Status: DC

## 2017-03-27 MED ORDER — LEVOTHYROXINE SODIUM 88 MCG PO TABS
88.0000 ug | ORAL_TABLET | Freq: Every day | ORAL | Status: DC
  Administered 2017-03-28 – 2017-03-31 (×4): 88 ug via ORAL
  Filled 2017-03-27 (×4): qty 1

## 2017-03-27 MED ORDER — METOPROLOL TARTRATE 50 MG PO TABS
50.0000 mg | ORAL_TABLET | Freq: Two times a day (BID) | ORAL | Status: DC
  Administered 2017-03-27 – 2017-03-31 (×9): 50 mg via ORAL
  Filled 2017-03-27 (×9): qty 1

## 2017-03-27 MED ORDER — AMLODIPINE BESYLATE 5 MG PO TABS
5.0000 mg | ORAL_TABLET | Freq: Every day | ORAL | Status: DC
  Administered 2017-03-27 – 2017-03-30 (×4): 5 mg via ORAL
  Filled 2017-03-27 (×4): qty 1

## 2017-03-27 MED ORDER — ASPIRIN EC 81 MG PO TBEC
81.0000 mg | DELAYED_RELEASE_TABLET | Freq: Every day | ORAL | Status: DC
  Administered 2017-03-27 – 2017-03-31 (×5): 81 mg via ORAL
  Filled 2017-03-27 (×5): qty 1

## 2017-03-27 NOTE — Progress Notes (Addendum)
[facility]  Subjective  - Doing better each day, no new complaints.   Objective (!) 152/76 90 98.4 F (36.9 C) (Oral) 17 99%  Intake/Output Summary (Last 24 hours) at 03/27/2017 0745 Last data filed at 03/27/2017 0500 Gross per 24 hour  Intake 710 ml  Output 1750 ml  Net -1040 ml    Palpable DP pulse B Abdomin soft with positive BS, no N/V Abdominal incision healing well Lungs non labored breathing Heart RRR  Gen NAD, A & O  Assessment/Planning: POD # 3 Aortobi-iliac bypass with 16 x 8 dacron graft  OOB to chair daily tolerating mobility UO 1700 last 24 hours, Cr cont. To increase now 5.02 Nephrology following, foley in place to gravity will see if follow can be D/C's.  3L positive.   EBL during surgery 700cc, received 2 units PRBC 03/26/2017, tolerated well.  HGB stable 9.9 NG d/c yesterday no N/V with ice chips will advance to clears today as tolerates, positive BS. Plan to transfer to step down 4E today.   Roxy Horseman 03/27/2017 7:45 AM --  Laboratory Lab Results: Recent Labs    03/26/17 0356 03/26/17 1515 03/27/17 0532  WBC 14.5*  --  12.2*  HGB 7.0* 9.9* 9.9*  HCT 21.6* 30.9* 29.5*  PLT 129*  --  124*   BMET Recent Labs    03/26/17 0356 03/27/17 0532  NA 143 143  K 3.9 3.8  CL 109 108  CO2 20* 22  GLUCOSE 106* 93  BUN 50* 50*  CREATININE 4.95* 5.02*  CALCIUM 8.0* 8.2*    COAG Lab Results  Component Value Date   INR 1.16 03/25/2017   INR 0.95 03/21/2017   No results found for: PTT  I have examined the patient, reviewed and agree with above.  Marland Kitchen  Up in the chair and comfortable. Creatinine up slightly at 5.0   CreatiExcellent urine outputnine up slightly at 5.0 excellent urine output  Curt Jews, MD 03/27/2017 9:26 AM

## 2017-03-27 NOTE — Progress Notes (Signed)
Report called to RN on 4E. 

## 2017-03-27 NOTE — Progress Notes (Signed)
[facility] Rounding Note Background: Pt followed by Dr. Moshe Cipro. Baseline creatinine 1.23 June 2016. 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 Creatinine started trending ^ 12/2016 2.2, then 2.9.  Renal US  - size discrepancy atrophic R and AAA. CT scan 7.3 cm AAA, extensive athero, multiple bilateral renal cysts, AAA repair (aortoiliac bypass) by Dr. Donzetta Matters 3/7. Creatinine 4.23 pre-op. Transient intra op need for phenylephrine. 700 EBL with 300 cell saver returned. 3.5 L fluids. Aorta clamped below renals. Post op AKI   Assessment/Plan 1. CKD: Follows with Dr. Moshe Cipro. Likely ischemic nephropathy as initial etiology  1. Prior baseline creatinine 1.6 and had uptrended to  4.23 preoperatively w/recognition of the AAA.   2. Post op 3.75 (? Diluted),  rising past 48 hours, up to 5 today but rate of rise slowing! 3. Good UOP yesterday (gave lasix 80 with PRBC's)   4. Still about 3L + from admission but good UOP so far today so will not redose lasix today, leave foley in 1 more day 2. AAA s/p aorto bi-iliac bypass: s/p repair 3/7 with Dr. Donzetta Matters.  3. HTN: Briefly on phenylephrine during repair. BR drifting up, amlodipine/metoprolol 4. ABLA - Hb down 14.4 pre op ->8.7->7. 2/p 2 units 3/9. Hb today back to 9.9  Jamal Maes, MD [place] Pager 03/27/2017, 10:05 AM  Subjective/Interval History:  Transfused yesterday Gave lasix with PRBC's with boost in UOP Rate of rise of creatinine post op seems to be slowing down! Up in the chair, denies SOB  Objective Vital signs in last 24 hours: Vitals:   03/27/17 0700 03/27/17 0752 03/27/17 0800 03/27/17 0900  BP: (!) 158/68  (!) 142/104   Pulse: 95  93   Resp: (!) 21  (!) 22 (!) 24  Temp:  97.6 F (36.4 C)    TempSrc:  Oral    SpO2: 96%  92%   Weight:       Weight change:   Intake/Output Summary (Last 24 hours) at 03/27/2017 1005 Last data filed at 03/27/2017 0800 Gross per 24 hour  Intake 680 ml   Output 1900 ml  Net -1220 ml   Physical Exam:  Blood pressure (!) 142/104, pulse 93, temperature 97.6 F (36.4 C), temperature source Oral, resp. rate (!) 24, weight 77.9 kg (171 lb 11.8 oz), SpO2 92 %.  Looks comfortable, NAD Daughter in with her, questions answered Lungs decr BS but clear S1S2 No S3 Abd long midline surgical scar incisions clean and dry + BS No LE edema so far Couple of blue dots R plantar, no stigmata atheremboli left  Recent Labs  Lab 03/21/17 1345 03/24/17 0935 03/24/17 1125 03/24/17 1237 03/25/17 0419 03/26/17 0356 03/27/17 0532  NA 139 141 140 139 141 143 143  K 3.9 3.3* 3.9 3.9 4.1 3.9 3.8  CL 103  --   --  109 108 109 108  CO2 21*  --   --  21* 21* 20* 22  GLUCOSE 92  --   --  145* 120* 106* 93  BUN 49*  --   --  39* 44* 50* 50*  CREATININE 4.23*  --   --  3.75* 4.24* 4.95* 5.02*  CALCIUM 9.2  --   --  7.7* 7.6* 8.0* 8.2*  PHOS  --   --   --   --   --  6.0* 6.0*    Recent Labs  Lab 03/21/17 1345 03/26/17 0356 03/27/17 0532  AST 18  --   --  ALT 12*  --   --   ALKPHOS 72  --   --   BILITOT 0.5  --   --   PROT 6.6  --   --   ALBUMIN 3.7 2.6* 2.4*    Recent Labs  Lab 03/24/17 1237 03/25/17 0419 03/26/17 0356 03/26/17 1515 03/27/17 0532  WBC 20.6* 10.9* 14.5*  --  12.2*  HGB 10.0* 8.7* 7.0* 9.9* 9.9*  HCT 29.8* 26.4* 21.6* 30.9* 29.5*  MCV 94.3 95.3 95.6  --  90.2  PLT 170 145* 129*  --  124*    Recent Labs  Lab 03/25/17 2322 03/26/17 0259 03/26/17 0823 03/26/17 1159 03/26/17 1633  GLUCAP 101* 102* 96 91 83    Dg Chest Port 1 View Result Date: 03/24/2017 CLINICAL DATA:  AAA repair EXAM: PORTABLE CHEST 1 VIEW COMPARISON:  09/21/2011 FINDINGS: There is diffuse bilateral interstitial thickening. There is a possible trace left pleural effusion. There is no right pleural effusion. There is no pneumothorax. There is stable cardiomegaly. There is thoracic aortic atherosclerosis. There is a right-sided Swan-Ganz catheter with the  tip projecting over the right ventricular outflow tract. There is a nasogastric tube coursing below the diaphragm. The osseous structures are unremarkable. IMPRESSION: 1. Cardiomegaly with mild pulmonary vascular congestion. 2. Swan-Ganz catheter with the tip projecting over the right ventricular outflow tract. Electronically Signed   By: Kathreen Devoid   On: 03/24/2017 12:58   Medications: . sodium chloride    . magnesium sulfate 1 - 4 g bolus IVPB     . amLODipine  5 mg Oral Daily  . aspirin EC  81 mg Oral Daily  . docusate sodium  100 mg Oral Daily  . heparin  5,000 Units Subcutaneous Q8H  . [START ON 03/28/2017] levothyroxine  88 mcg Oral QAC breakfast  . metoprolol tartrate  50 mg Oral BID  . pantoprazole  40 mg Oral Daily  . rosuvastatin  10 mg Oral Daily

## 2017-03-27 NOTE — Plan of Care (Signed)
  Health Behavior/Discharge Planning: Ability to manage health-related needs will improve 03/27/2017 2250 - Progressing by Lajoyce Corners, RN  Patient expressed concern about her rehabilitation as she is her spouse's primary caregiver. Patient and daughter counseled on other resources for care for patient and spouse so that patient can comply with instructions upon discharge.

## 2017-03-28 LAB — RENAL FUNCTION PANEL
ANION GAP: 11 (ref 5–15)
Albumin: 2.3 g/dL — ABNORMAL LOW (ref 3.5–5.0)
BUN: 49 mg/dL — AB (ref 6–20)
CO2: 23 mmol/L (ref 22–32)
Calcium: 8.3 mg/dL — ABNORMAL LOW (ref 8.9–10.3)
Chloride: 105 mmol/L (ref 101–111)
Creatinine, Ser: 4.79 mg/dL — ABNORMAL HIGH (ref 0.44–1.00)
GFR calc Af Amer: 9 mL/min — ABNORMAL LOW (ref >60)
GFR calc non Af Amer: 8 mL/min — ABNORMAL LOW (ref >60)
GLUCOSE: 85 mg/dL (ref 65–99)
Phosphorus: 3.9 mg/dL (ref 2.5–4.6)
Potassium: 3.4 mmol/L — ABNORMAL LOW (ref 3.5–5.1)
Sodium: 139 mmol/L (ref 135–145)

## 2017-03-28 NOTE — Progress Notes (Signed)
[facility] Rounding Note Background: Pt followed by Dr. Moshe Cipro. Baseline creatinine 1.23 June 2016. 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 Creatinine started trending ^ 12/2016 2.2, then 2.9.  Renal US  - size discrepancy atrophic R and AAA. CT scan 7.3 cm AAA, extensive athero, multiple bilateral renal cysts, AAA repair (aortoiliac bypass) by Dr. Donzetta Matters 3/7. Creatinine 4.23 pre-op. Transient intra op need for phenylephrine. 700 EBL with 300 cell saver returned. 3.5 L fluids. Aorta clamped below renals. Post op AKI   Assessment/Plan 1. CKD: Follows with Dr. Moshe Cipro. Likely ischemic nephropathy as initial etiology  1. Prior baseline creatinine 1.6 and had uptrended to  4.23 preoperatively w/recognition of the AAA.   2. Post op 3.75  risingsince, up to 5 yest- 4.79 today 3. Good UOP- not uremic  2. Still  + from admission but good UOP so far today so will not redose lasix today,can d/c foley 3. AAA s/p aorto bi-iliac bypass: s/p repair 3/7 with Dr. Donzetta Matters.  4. HTN: Briefly on phenylephrine during repair. BR drifting up, amlodipine/metoprolol- will take some time for amlodipine to kick in- baseline has very high BP  5. ABLA - Hb down 14.4 pre op ->8.7->7. 2/p 2 units 3/9. Hb today back to 9.9  Laray Corbit A 03/27/2017, 10:05 AM  Subjective/Interval History:  Made 1600 of urine- creatinine trended down !   Objective Vital signs in last 24 hours: Vitals:   03/28/17 0105 03/28/17 0245 03/28/17 0733 03/28/17 1037  BP:  (!) 165/76 (!) 154/69 (!) 157/76  Pulse:  66 69 90  Resp:  (!) 22 16   Temp:  98.8 F (37.1 C) 98.5 F (36.9 C)   TempSrc:  Oral Oral   SpO2:  94% 94%   Weight: 77.9 kg (171 lb 11.8 oz)     Height: 5\' 5"  (1.651 m)      Weight change:   Intake/Output Summary (Last 24 hours) at 03/28/2017 1105 Last data filed at 03/28/2017 0952 Gross per 24 hour  Intake -  Output 1650 ml  Net -1650 ml   Physical Exam:  Blood pressure (!) 157/76, pulse 90,  temperature 98.5 F (36.9 C), temperature source Oral, resp. rate 16, height 5\' 5"  (1.651 m), weight 77.9 kg (171 lb 11.8 oz), SpO2 94 %.  Looks comfortable, NAD Daughter in with her, questions answered Lungs decr BS but clear S1S2 No S3 Abd long midline surgical scar incisions clean and dry + BS No LE edema so far Couple of blue dots R plantar, no stigmata atheremboli left  Recent Labs  Lab 03/21/17 1345 03/24/17 0935 03/24/17 1125 03/24/17 1237 03/25/17 0419 03/26/17 0356 03/27/17 0532 03/28/17 0239  NA 139 141 140 139 141 143 143 139  K 3.9 3.3* 3.9 3.9 4.1 3.9 3.8 3.4*  CL 103  --   --  109 108 109 108 105  CO2 21*  --   --  21* 21* 20* 22 23  GLUCOSE 92  --   --  145* 120* 106* 93 85  BUN 49*  --   --  39* 44* 50* 50* 49*  CREATININE 4.23*  --   --  3.75* 4.24* 4.95* 5.02* 4.79*  CALCIUM 9.2  --   --  7.7* 7.6* 8.0* 8.2* 8.3*  PHOS  --   --   --   --   --  6.0* 6.0* 3.9    Recent Labs  Lab 03/21/17 1345 03/26/17 0356 03/27/17 0532 03/28/17 0239  AST 18  --   --   --  ALT 12*  --   --   --   ALKPHOS 72  --   --   --   BILITOT 0.5  --   --   --   PROT 6.6  --   --   --   ALBUMIN 3.7 2.6* 2.4* 2.3*    Recent Labs  Lab 03/24/17 1237 03/25/17 0419 03/26/17 0356 03/26/17 1515 03/27/17 0532  WBC 20.6* 10.9* 14.5*  --  12.2*  HGB 10.0* 8.7* 7.0* 9.9* 9.9*  HCT 29.8* 26.4* 21.6* 30.9* 29.5*  MCV 94.3 95.3 95.6  --  90.2  PLT 170 145* 129*  --  124*    Recent Labs  Lab 03/25/17 2322 03/26/17 0259 03/26/17 0823 03/26/17 1159 03/26/17 1633  GLUCAP 101* 102* 96 91 83    Dg Chest Port 1 View Result Date: 03/24/2017 CLINICAL DATA:  AAA repair EXAM: PORTABLE CHEST 1 VIEW COMPARISON:  09/21/2011 FINDINGS: There is diffuse bilateral interstitial thickening. There is a possible trace left pleural effusion. There is no right pleural effusion. There is no pneumothorax. There is stable cardiomegaly. There is thoracic aortic atherosclerosis. There is a right-sided  Swan-Ganz catheter with the tip projecting over the right ventricular outflow tract. There is a nasogastric tube coursing below the diaphragm. The osseous structures are unremarkable. IMPRESSION: 1. Cardiomegaly with mild pulmonary vascular congestion. 2. Swan-Ganz catheter with the tip projecting over the right ventricular outflow tract. Electronically Signed   By: Kathreen Devoid   On: 03/24/2017 12:58   Medications: . sodium chloride    . magnesium sulfate 1 - 4 g bolus IVPB     . amLODipine  5 mg Oral Daily  . aspirin EC  81 mg Oral Daily  . docusate sodium  100 mg Oral Daily  . heparin  5,000 Units Subcutaneous Q8H  . levothyroxine  88 mcg Oral QAC breakfast  . metoprolol tartrate  50 mg Oral BID  . pantoprazole  40 mg Oral Daily  . rosuvastatin  10 mg Oral Daily

## 2017-03-28 NOTE — Progress Notes (Addendum)
  Progress Note    03/28/2017 7:32 AM 4 Days Post-Op  Subjective:  Having flatus; no BM yet   Vitals:   03/27/17 2340 03/28/17 0245  BP: (!) 162/73 (!) 165/76  Pulse:  66  Resp:  (!) 22  Temp: 98.7 F (37.1 C) 98.8 F (37.1 C)  SpO2: 96% 94%   Physical Exam: Lungs:  No increase in resp effort Incisions:  abd incision healing well without hernia, hematoma, or sign of infection Extremities:  Feet warm to touch with good capillary refill Abdomen:  Normoactive bowel sounds, soft, nontender Neurologic: A&O  CBC    Component Value Date/Time   WBC 12.2 (H) 03/27/2017 0532   RBC 3.27 (L) 03/27/2017 0532   HGB 9.9 (L) 03/27/2017 0532   HCT 29.5 (L) 03/27/2017 0532   PLT 124 (L) 03/27/2017 0532   MCV 90.2 03/27/2017 0532   MCH 30.3 03/27/2017 0532   MCHC 33.6 03/27/2017 0532   RDW 16.6 (H) 03/27/2017 0532    BMET    Component Value Date/Time   NA 139 03/28/2017 0239   K 3.4 (L) 03/28/2017 0239   CL 105 03/28/2017 0239   CO2 23 03/28/2017 0239   GLUCOSE 85 03/28/2017 0239   BUN 49 (H) 03/28/2017 0239   CREATININE 4.79 (H) 03/28/2017 0239   CALCIUM 8.3 (L) 03/28/2017 0239   GFRNONAA 8 (L) 03/28/2017 0239   GFRAA 9 (L) 03/28/2017 0239    INR    Component Value Date/Time   INR 1.16 03/25/2017 0419     Intake/Output Summary (Last 24 hours) at 03/28/2017 0732 Last data filed at 03/27/2017 2342 Gross per 24 hour  Intake -  Output 1600 ml  Net -1600 ml     Assessment/Plan:  83 y.o. female is s/p aortobi-iliac bypass 4 Days Post-Op   PT/OT eval pending Continue clear liquids CKD and foley management per Nephrology D/c pending therapy eval, bowel function, and when cleared by Nephrology  DVT prophylaxis:  subq heparin   Dagoberto Ligas, PA-C [facility] [phone] 03/28/2017 7:32 AM  I have independently interviewed and examined patient and agree with PA assessment and plan above. Abdomen is soft, feet are well perfused. Will get PT  assessment. Nephrology following.  Chirstina Haan C. Donzetta Matters, MD [facility] [place] Office: [phone] Pager: [phone]

## 2017-03-28 NOTE — Evaluation (Signed)
Physical Therapy Evaluation Patient Details Name: Kristin Gross MRN: [id number] DOB: 1934/10/09 Today's Date: 03/28/2017   History of Present Illness   83 year old female with history of chronic kidney disease who was found to have 6.8 cm abdominal aortic aneurysm. PMH: CKD, HTN, PONV  Clinical Impression  Pt admitted with above diagnosis. Pt currently with functional limitations due to the deficits listed below (see PT Problem List). Pt educated on bed mobility to decrease strain on abdominal wall, able to get to EOB with superivison and use of rail. Min guard A for transfer and ambulation. Fatigued after 20' with RW. O2 sats dropped to 87% on RA with ambulation, return to 90's with rest and 1L O2.  Pt will benefit from skilled PT to increase their independence and safety with mobility to allow discharge to the venue listed below.       Follow Up Recommendations Home health PT;Other (comment)(could benefit from cardiac rehab)    Equipment Recommendations  None recommended by PT    Recommendations for Other Services OT consult     Precautions / Restrictions Precautions Precautions: Fall Restrictions Weight Bearing Restrictions: No      Mobility  Bed Mobility Overal bed mobility: Needs Assistance Bed Mobility: Supine to Sit     Supine to sit: Supervision     General bed mobility comments: vc's for rolling first to decrease stress on abdomen. Pt able to complete with bed flat but use of rail needed  Transfers Overall transfer level: Needs assistance Equipment used: Rolling walker (2 wheeled) Transfers: Sit to/from Stand Sit to Stand: Min guard         General transfer comment: vc's for hand placement, min-guard for safety  Ambulation/Gait Ambulation/Gait assistance: Min guard Ambulation Distance (Feet): 20 Feet Assistive device: Rolling walker (2 wheeled) Gait Pattern/deviations: Step-through pattern;Decreased stride length;Trunk flexed Gait velocity:  decreased Gait velocity interpretation: <1.8 ft/sec, indicative of risk for recurrent falls General Gait Details: fatigued quickly, vc's for forward gaze and erect posture. O2 sats 87% on RA after ambulation. Back up to 94% on 1L within 3 mins  Stairs            Wheelchair Mobility    Modified Rankin (Stroke Patients Only)       Balance Overall balance assessment: No apparent balance deficits (not formally assessed)                                           Pertinent Vitals/Pain Pain Assessment: Faces Faces Pain Scale: Hurts even more Pain Location: abdomen Pain Descriptors / Indicators: Grimacing Pain Intervention(s): Limited activity within patient's tolerance;Monitored during session    Home Living Family/patient expects to be discharged to:: Private residence Living Arrangements: Spouse/significant other Available Help at Discharge: Family;Available 24 hours/day Type of Home: House Home Access: Stairs to enter Entrance Stairs-Rails: None Entrance Stairs-Number of Steps: 4 Home Layout: Two level;1/2 bath on main level Home Equipment: [place] - 2 wheels Additional Comments: husband has RW he is not currently using. Lives with husband but children nearby and check daily    Prior Function Level of Independence: Independent               Hand Dominance        Extremity/Trunk Assessment   Upper Extremity Assessment Upper Extremity Assessment: Overall WFL for tasks assessed    Lower Extremity Assessment Lower Extremity Assessment: Generalized weakness  Cervical / Trunk Assessment Cervical / Trunk Assessment: Normal  Communication   Communication: No difficulties  Cognition Arousal/Alertness: Awake/alert Behavior During Therapy: WFL for tasks assessed/performed Overall Cognitive Status: Within Functional Limits for tasks assessed                                        General Comments      Exercises      Assessment/Plan    PT Assessment Patient needs continued PT services  PT Problem List Decreased strength;Decreased activity tolerance;Decreased balance;Decreased mobility;Decreased knowledge of use of DME;Decreased knowledge of precautions;Pain;Cardiopulmonary status limiting activity       PT Treatment Interventions DME instruction;Gait training;Stair training;Functional mobility training;Therapeutic activities;Therapeutic exercise;Balance training;Patient/family education    PT Goals (Current goals can be found in the Care Plan section)  Acute Rehab PT Goals Patient Stated Goal: return home PT Goal Formulation: With patient/family Time For Goal Achievement: 04/11/17 Potential to Achieve Goals: Good    Frequency Min 3X/week   Barriers to discharge        Co-evaluation               AM-PAC PT "6 Clicks" Daily Activity  Outcome Measure Difficulty turning over in bed (including adjusting bedclothes, sheets and blankets)?: A Little Difficulty moving from lying on back to sitting on the side of the bed? : A Little Difficulty sitting down on and standing up from a chair with arms (e.g., wheelchair, bedside commode, etc,.)?: A Little Help needed moving to and from a bed to chair (including a wheelchair)?: A Little Help needed walking in hospital room?: A Little Help needed climbing 3-5 steps with a railing? : A Little 6 Click Score: 18    End of Session Equipment Utilized During Treatment: Gait belt Activity Tolerance: Patient tolerated treatment well Patient left: in chair;with call bell/phone within reach;with family/visitor present Nurse Communication: Mobility status;Other (comment)(O2 sats) PT Visit Diagnosis: Unsteadiness on feet (R26.81);Muscle weakness (generalized) (M62.81);Pain Pain - part of body: (abdomen)    Time: 9450-3888 PT Time Calculation (min) (ACUTE ONLY): 22 min   Charges:   PT Evaluation $PT Eval Moderate Complexity: 1 Mod     PT G Codes:         Leighton Roach, PT  Acute Rehab Services  [place] 03/28/2017, 2:43 PM

## 2017-03-29 LAB — RENAL FUNCTION PANEL
Albumin: 2.2 g/dL — ABNORMAL LOW (ref 3.5–5.0)
Anion gap: 11 (ref 5–15)
BUN: 53 mg/dL — ABNORMAL HIGH (ref 6–20)
CO2: 24 mmol/L (ref 22–32)
Calcium: 8.2 mg/dL — ABNORMAL LOW (ref 8.9–10.3)
Chloride: 104 mmol/L (ref 101–111)
Creatinine, Ser: 4.69 mg/dL — ABNORMAL HIGH (ref 0.44–1.00)
GFR calc Af Amer: 9 mL/min — ABNORMAL LOW
GFR calc non Af Amer: 8 mL/min — ABNORMAL LOW
Glucose, Bld: 99 mg/dL (ref 65–99)
Phosphorus: 3.4 mg/dL (ref 2.5–4.6)
Potassium: 3.3 mmol/L — ABNORMAL LOW (ref 3.5–5.1)
Sodium: 139 mmol/L (ref 135–145)

## 2017-03-29 MED ORDER — POTASSIUM CHLORIDE CRYS ER 20 MEQ PO TBCR
40.0000 meq | EXTENDED_RELEASE_TABLET | Freq: Once | ORAL | Status: AC
  Administered 2017-03-29: 40 meq via ORAL
  Filled 2017-03-29: qty 2

## 2017-03-29 MED FILL — Sodium Chloride IV Soln 0.9%: INTRAVENOUS | Qty: 1000 | Status: AC

## 2017-03-29 MED FILL — Heparin Sodium (Porcine) Inj 1000 Unit/ML: INTRAMUSCULAR | Qty: 30 | Status: AC

## 2017-03-29 NOTE — Care Management Important Message (Signed)
Important Message  Patient Details  Name: Kristin Gross MRN: [id number] Date of Birth: Sep 28, 1934   Medicare Important Message Given:  Yes    Mahesh Sizemore P [place] 03/29/2017, 1:43 PM

## 2017-03-29 NOTE — Progress Notes (Signed)
Physical Therapy Treatment Patient Details Name: Kristin Gross MRN: [id number] DOB: November 10, 1934 Today's Date: 03/29/2017    History of Present Illness  83 year old female with history of chronic kidney disease who was found to have 6.8 cm abdominal aortic aneurysm. PMH: CKD, HTN, PONV    PT Comments    Pt performed gait training on RA and remains to present with shortness of breath and fatigue.  Her decreased functional capacity places her ask risk for a fall if she returns home without assistance.  Pt continues to make progress but at a slower pace.  AT this time will change recommendations to short term SNF as patient will require a slower recovery process.  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  Will inform supervising PT of change in recommendations at this time.    Follow Up Recommendations  Supervision/Assistance - 24 hour;SNF(Pt does not have 24 hour assistance at home as her husband is medically frail.  Will be safest for SNF placement.  )     Equipment Recommendations  None recommended by PT    Recommendations for Other Services OT consult     Precautions / Restrictions Precautions Precautions: Fall Restrictions Weight Bearing Restrictions: No    Mobility  Bed Mobility Overal bed mobility: Needs Assistance Bed Mobility: Supine to Sit     Supine to sit: Supervision     General bed mobility comments: Increased time and cueing for strategy  Transfers Overall transfer level: Needs assistance Equipment used: Rolling walker (2 wheeled) Transfers: Sit to/from Stand Sit to Stand: Min guard         General transfer comment: vc's for hand placement, min-guard for safety  Ambulation/Gait Ambulation/Gait assistance: Min guard Ambulation Distance (Feet): 60 Feet Assistive device: Rolling walker (2 wheeled) Gait Pattern/deviations: Step-through pattern;Decreased stride length;Trunk flexed Gait velocity: decreased   General Gait Details: Pt remains to fatigue  quickly during session. SPO2 on RA maintained above 90% throughout with exception of desaturation to 88% x1 and quickly returning above 90%.  Pt required cues for pursed lip breathing and safety with RW during L turn.  R wheel came up and minor LOB observed but able to correct.     Stairs            Wheelchair Mobility    Modified Rankin (Stroke Patients Only)       Balance Overall balance assessment: Mild deficits observed, not formally tested                                          Cognition Arousal/Alertness: Awake/alert Behavior During Therapy: WFL for tasks assessed/performed Overall Cognitive Status: Within Functional Limits for tasks assessed                                        Exercises      General Comments        Pertinent Vitals/Pain Pain Assessment: Faces Faces Pain Scale: Hurts even more Pain Location: abdomen Pain Descriptors / Indicators: Grimacing Pain Intervention(s): Repositioned    Home Living                      Prior Function            PT Goals (current goals can now be found in the  care plan section) Acute Rehab PT Goals Patient Stated Goal: be strong enough to care for husband before home Potential to Achieve Goals: Good Progress towards PT goals: Progressing toward goals    Frequency    Min 3X/week      PT Plan Discharge plan needs to be updated    Co-evaluation              AM-PAC PT "6 Clicks" Daily Activity  Outcome Measure  Difficulty turning over in bed (including adjusting bedclothes, sheets and blankets)?: A Little Difficulty moving from lying on back to sitting on the side of the bed? : A Little Difficulty sitting down on and standing up from a chair with arms (e.g., wheelchair, bedside commode, etc,.)?: Unable Help needed moving to and from a bed to chair (including a wheelchair)?: A Little Help needed walking in hospital room?: A Little Help needed  climbing 3-5 steps with a railing? : A Little 6 Click Score: 16    End of Session Equipment Utilized During Treatment: Gait belt Activity Tolerance: Patient tolerated treatment well Patient left: with call bell/phone within reach;with family/visitor present;in bed(Pt sitting edge of bed eating supper post session.  ) Nurse Communication: Mobility status;Other (comment)(Informed nursing of change in recs and leaving patient on RA.  ) PT Visit Diagnosis: Unsteadiness on feet (R26.81);Muscle weakness (generalized) (M62.81);Pain Pain - part of body: (abdomen)     Time: 2876-8115 PT Time Calculation (min) (ACUTE ONLY): 12 min  Charges:  $Gait Training: 8-22 mins                    G Codes:       Governor Rooks, PTA pager [phone]    Cristela Blue 03/29/2017, 5:13 PM

## 2017-03-29 NOTE — Progress Notes (Addendum)
  Progress Note    03/29/2017 7:41 AM 5 Days Post-Op  Subjective:  Tolerating regular diet without N/V   Vitals:   03/29/17 0022 03/29/17 0347  BP: (!) 145/76 (!) 154/76  Pulse: 80 82  Resp: (!) 21 (!) 28  Temp: 98 F (36.7 C) 97.7 F (36.5 C)  SpO2: 94% 93%   Physical Exam: Lungs:  Non labored Incisions:  abd incision unremarkable, remains intact Extremities:  Feet are symmetrically warm to touch; palpable L AT Abdomen:  Soft, normoactive bowels Neurologic: A&O  CBC    Component Value Date/Time   WBC 12.2 (H) 03/27/2017 0532   RBC 3.27 (L) 03/27/2017 0532   HGB 9.9 (L) 03/27/2017 0532   HCT 29.5 (L) 03/27/2017 0532   PLT 124 (L) 03/27/2017 0532   MCV 90.2 03/27/2017 0532   MCH 30.3 03/27/2017 0532   MCHC 33.6 03/27/2017 0532   RDW 16.6 (H) 03/27/2017 0532    BMET    Component Value Date/Time   NA 139 03/29/2017 0216   K 3.3 (L) 03/29/2017 0216   CL 104 03/29/2017 0216   CO2 24 03/29/2017 0216   GLUCOSE 99 03/29/2017 0216   BUN 53 (H) 03/29/2017 0216   CREATININE 4.69 (H) 03/29/2017 0216   CALCIUM 8.2 (L) 03/29/2017 0216   GFRNONAA 8 (L) 03/29/2017 0216   GFRAA 9 (L) 03/29/2017 0216    INR    Component Value Date/Time   INR 1.16 03/25/2017 0419     Intake/Output Summary (Last 24 hours) at 03/29/2017 0741 Last data filed at 03/28/2017 1205 Gross per 24 hour  Intake 240 ml  Output 400 ml  Net -160 ml     Assessment/Plan:  83 y.o. female is s/p aortobi-iliac bypass 5 Days Post-Op   Care management consulted to arrange Home PT Continue regular diet today Encouraged OOB Anticipating discharge home tomorrow if ok with Nephrology   Dagoberto Ligas, PA-C [facility] [phone] 03/29/2017 7:41 AM  I have independently interviewed and examined patient and agree with PA assessment and plan above.   Maripaz Mullan C. Donzetta Matters, MD [facility] [place] Office: [phone] Pager: [phone]

## 2017-03-29 NOTE — Evaluation (Signed)
Occupational Therapy Evaluation Patient Details Name: Kristin Gross MRN: [id number] DOB: September 08, 1934 Today's Date: 03/29/2017    History of Present Illness  83 year old female with history of chronic kidney disease who was found to have 6.8 cm abdominal aortic aneurysm. PMH: CKD, HTN, PONV   Clinical Impression   PTA, pt was independent with ADL and functional mobility and provided physical assistance for husband daily. She is also responsible for all meal preparation and home management and husband unable to assist with these tasks. Pt currently requires min assist for ambulating toilet transfer with RW and min guard assist for standing grooming tasks as well as LB ADL. Pt limited by abdominal pain and decreased activity tolerance for ADL. Discussed level of independence required for home and pt reports that she is unsure of her ability to complete ADL/IADL and assist husband post-acute D/C. From OT perspective, concerned that pt is at risk of injury if attempting to care for her husband too soon in the recovery process. Family in agreement. Pt would benefit from short-term rehabilitation at SNF level if family unable to provide 24 hour hands on assistance at least initially. OT will continue to follow while admitted.     Follow Up Recommendations  SNF;Home health OT;Supervision/Assistance - 24 hour(HHOT only if able to have 24 hour assistance from family)    Equipment Recommendations  3 in 1 bedside commode    Recommendations for Other Services       Precautions / Restrictions Precautions Precautions: Fall Restrictions Weight Bearing Restrictions: No      Mobility Bed Mobility               General bed mobility comments: seated at EOB on my arrival.   Transfers Overall transfer level: Needs assistance Equipment used: Rolling walker (2 wheeled) Transfers: Sit to/from Stand Sit to Stand: Min guard         General transfer comment: vc's for hand placement, min-guard for  safety    Balance Overall balance assessment: No apparent balance deficits (not formally assessed)                                         ADL either performed or assessed with clinical judgement   ADL Overall ADL's : Needs assistance/impaired Eating/Feeding: Set up;Sitting   Grooming: Min guard;Standing   Upper Body Bathing: Supervision/ safety;Sitting   Lower Body Bathing: Min guard;Sit to/from stand   Upper Body Dressing : Supervision/safety;Sitting   Lower Body Dressing: Min guard;Sit to/from stand   Toilet Transfer: Minimal assistance;Ambulation;RW;BSC   Toileting- Water quality scientist and Hygiene: Min guard;Sit to/from stand       Functional mobility during ADLs: Minimal assistance;Rolling walker General ADL Comments: Pt with instability when ambulating. SpO2 on RA desaturation to 87% during mobility but improved to >95% with pursed lip breathing and standing rest break. Able to ambulate 50 feet in hallway.      Vision Baseline Vision/History: Wears glasses Wears Glasses: Reading only(bifocals) Patient Visual Report: No change from baseline Vision Assessment?: No apparent visual deficits     Perception     Praxis      Pertinent Vitals/Pain Pain Assessment: 0-10 Pain Score: 2  Pain Location: abdomen Pain Descriptors / Indicators: Grimacing Pain Intervention(s): Limited activity within patient's tolerance;Monitored during session;Repositioned     Hand Dominance     Extremity/Trunk Assessment Upper Extremity Assessment Upper Extremity Assessment: Overall WFL for  tasks assessed   Lower Extremity Assessment Lower Extremity Assessment: Generalized weakness   Cervical / Trunk Assessment Cervical / Trunk Assessment: Normal   Communication Communication Communication: No difficulties   Cognition Arousal/Alertness: Awake/alert Behavior During Therapy: WFL for tasks assessed/performed Overall Cognitive Status: Within Functional Limits  for tasks assessed                                     General Comments       Exercises     Shoulder Instructions      Home Living Family/patient expects to be discharged to:: Private residence Living Arrangements: Spouse/significant other Available Help at Discharge: Family Type of Home: House Home Access: Stairs to enter Technical brewer of Steps: 4 Entrance Stairs-Rails: None Home Layout: Two level;1/2 bath on main level Alternate Level Stairs-Number of Steps: flight   Bathroom Shower/Tub: Occupational psychologist: Standard     Home Equipment: Environmental consultant - 2 wheels;Bedside commode;Cane - single point;Shower seat   Additional Comments: Pt assists husband with his daily ADL.       Prior Functioning/Environment Level of Independence: Independent        Comments: Cares for husband who is limited in his mobility and functionality. He will not be able to assist her at home.         OT Problem List: Decreased strength;Decreased range of motion;Decreased activity tolerance;Impaired balance (sitting and/or standing);Decreased safety awareness;Decreased knowledge of use of DME or AE;Decreased knowledge of precautions;Pain      OT Treatment/Interventions: Self-care/ADL training;Therapeutic exercise;DME and/or AE instruction;Energy conservation;Therapeutic activities;Patient/family education;Balance training    OT Goals(Current goals can be found in the care plan section) Acute Rehab OT Goals Patient Stated Goal: be strong enough to care for husband before home OT Goal Formulation: With patient/family Time For Goal Achievement: 04/12/17 Potential to Achieve Goals: Good ADL Goals Pt Will Perform Grooming: with modified independence;standing Pt Will Perform Lower Body Dressing: with modified independence;sit to/from stand Pt Will Transfer to Toilet: with modified independence;ambulating;regular height toilet Pt Will Perform Toileting - Clothing  Manipulation and hygiene: with modified independence;sit to/from stand Pt Will Perform Tub/Shower Transfer: with modified independence;ambulating;shower seat;Shower transfer  OT Frequency: Min 2X/week   Barriers to D/C:            Co-evaluation              AM-PAC PT "6 Clicks" Daily Activity     Outcome Measure Help from another person eating meals?: A Little Help from another person taking care of personal grooming?: A Little Help from another person toileting, which includes using toliet, bedpan, or urinal?: A Little Help from another person bathing (including washing, rinsing, drying)?: A Little Help from another person to put on and taking off regular upper body clothing?: A Little Help from another person to put on and taking off regular lower body clothing?: A Little 6 Click Score: 18   End of Session Equipment Utilized During Treatment: Gait belt;Rolling walker Nurse Communication: Mobility status  Activity Tolerance: Patient tolerated treatment well Patient left: in chair;with call bell/phone within reach;with restraints reapplied  OT Visit Diagnosis: Other abnormalities of gait and mobility (R26.89);Pain Pain - part of body: (abdominal)                Time: 4315-4008 OT Time Calculation (min): 23 min Charges:  OT General Charges $OT Visit: 1 Visit OT Evaluation $OT Eval  Moderate Complexity: 1 Mod OT Treatments $Self Care/Home Management : 8-22 mins G-Codes:     Norman Herrlich, MS OTR/L  Pager: No Name A Yatziry Deakins 03/29/2017, 11:20 AM

## 2017-03-29 NOTE — Progress Notes (Signed)
[facility] Rounding Note Background: Pt followed by Dr. Moshe Cipro. Baseline creatinine 1.23 June 2016. 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 Creatinine started trending ^ 12/2016 2.2, then 2.9.  Renal US  - size discrepancy atrophic R and AAA. CT scan 7.3 cm AAA, extensive athero, multiple bilateral renal cysts, AAA repair (aortoiliac bypass) by Dr. Donzetta Matters 3/7. Creatinine 4.23 pre-op. Transient intra op need for phenylephrine. 700 EBL with 300 cell saver returned. 3.5 L fluids. Aorta clamped below renals. Post op AKI   Assessment/Plan 1. CKD: Follows with Dr. Moshe Cipro. Likely ischemic nephropathy as initial etiology  1. Prior baseline creatinine 1.6 and had uptrended to  4.23 preoperatively w/recognition of the AAA.   2. Post op 3.75  Rising since, up to 5 Sun- 4.79 yest, 4.69 today  3. Seemingly Good UOP- not uremic- no indications for HD-I would like to have her stay at least one more day so that we can monitor kidney function and make BP med adjustments as needed- but if crt stable tomorrow I can follow her kidney function as OP  2. Still  + from admission but good UOP - no  lasix 3. AAA s/p aorto bi-iliac bypass: s/p repair 3/7 with Dr. Donzetta Matters.  4. HTN: Briefly on phenylephrine during repair. BR drifting up, amlodipine/metoprolol- will take some time for amlodipine to kick in- baseline has very high BP - BP not ideal yet- wait one more day before increasing amlodipine- using PRNs  5. ABLA - Hb down 14.4 pre op ->8.7->7. 2/p 2 units 3/9. Hb today back to 9.9 6. Hypokalemia- will replete today   Gurnoor Sloop A 03/27/2017, 10:05 AM  Subjective/Interval History:  Only 400 of UOP recorded but had more - creatinine about the same   Objective Vital signs in last 24 hours: Vitals:   03/29/17 0022 03/29/17 0347 03/29/17 0818 03/29/17 0904  BP: (!) 145/76 (!) 154/76 (!) 152/81 (!) 153/76  Pulse: 80 82 85 85  Resp: (!) 21 (!) 28 18 (!) 28  Temp: 98 F (36.7 C) 97.7 F  (36.5 C) 98.6 F (37 C)   TempSrc: Oral  Oral   SpO2: 94% 93% 92% 92%  Weight:      Height:       Weight change:   Intake/Output Summary (Last 24 hours) at 03/29/2017 0952 Last data filed at 03/28/2017 1205 Gross per 24 hour  Intake 240 ml  Output -  Net 240 ml   Physical Exam:  Blood pressure (!) 153/76, pulse 85, temperature 98.6 F (37 C), temperature source Oral, resp. rate (!) 28, height 5\' 5"  (1.651 m), weight 77.9 kg (171 lb 11.8 oz), SpO2 92 %.  Looks comfortable, NAD Daughter in with her, questions answered Lungs decr BS but clear S1S2 No S3 Abd long midline surgical scar incisions clean and dry + BS No LE edema so far Couple of blue dots R plantar, no stigmata atheremboli left  Recent Labs  Lab 03/24/17 1125 03/24/17 1237 03/25/17 0419 03/26/17 0356 03/27/17 0532 03/28/17 0239 03/29/17 0216  NA 140 139 141 143 143 139 139  K 3.9 3.9 4.1 3.9 3.8 3.4* 3.3*  CL  --  109 108 109 108 105 104  CO2  --  21* 21* 20* 22 23 24   GLUCOSE  --  145* 120* 106* 93 85 99  BUN  --  39* 44* 50* 50* 49* 53*  CREATININE  --  3.75* 4.24* 4.95* 5.02* 4.79* 4.69*  CALCIUM  --  7.7* 7.6*  8.0* 8.2* 8.3* 8.2*  PHOS  --   --   --  6.0* 6.0* 3.9 3.4    Recent Labs  Lab 03/27/17 0532 03/28/17 0239 03/29/17 0216  ALBUMIN 2.4* 2.3* 2.2*    Recent Labs  Lab 03/24/17 1237 03/25/17 0419 03/26/17 0356 03/26/17 1515 03/27/17 0532  WBC 20.6* 10.9* 14.5*  --  12.2*  HGB 10.0* 8.7* 7.0* 9.9* 9.9*  HCT 29.8* 26.4* 21.6* 30.9* 29.5*  MCV 94.3 95.3 95.6  --  90.2  PLT 170 145* 129*  --  124*    Recent Labs  Lab 03/25/17 2322 03/26/17 0259 03/26/17 0823 03/26/17 1159 03/26/17 1633  GLUCAP 101* 102* 96 91 83    Dg Chest Port 1 View Result Date: 03/24/2017 CLINICAL DATA:  AAA repair EXAM: PORTABLE CHEST 1 VIEW COMPARISON:  09/21/2011 FINDINGS: There is diffuse bilateral interstitial thickening. There is a possible trace left pleural effusion. There is no right pleural  effusion. There is no pneumothorax. There is stable cardiomegaly. There is thoracic aortic atherosclerosis. There is a right-sided Swan-Ganz catheter with the tip projecting over the right ventricular outflow tract. There is a nasogastric tube coursing below the diaphragm. The osseous structures are unremarkable. IMPRESSION: 1. Cardiomegaly with mild pulmonary vascular congestion. 2. Swan-Ganz catheter with the tip projecting over the right ventricular outflow tract. Electronically Signed   By: Kathreen Devoid   On: 03/24/2017 12:58   Medications: . sodium chloride    . magnesium sulfate 1 - 4 g bolus IVPB     . amLODipine  5 mg Oral Daily  . aspirin EC  81 mg Oral Daily  . docusate sodium  100 mg Oral Daily  . heparin  5,000 Units Subcutaneous Q8H  . levothyroxine  88 mcg Oral QAC breakfast  . metoprolol tartrate  50 mg Oral BID  . pantoprazole  40 mg Oral Daily  . rosuvastatin  10 mg Oral Daily

## 2017-03-29 NOTE — Progress Notes (Signed)
Patient up to chair x1 this AM will monitor patient. Mickal Meno, Bettina Gavia RN

## 2017-03-30 LAB — RENAL FUNCTION PANEL
ALBUMIN: 2.4 g/dL — AB (ref 3.5–5.0)
ANION GAP: 11 (ref 5–15)
BUN: 52 mg/dL — ABNORMAL HIGH (ref 6–20)
CO2: 22 mmol/L (ref 22–32)
Calcium: 8.3 mg/dL — ABNORMAL LOW (ref 8.9–10.3)
Chloride: 106 mmol/L (ref 101–111)
Creatinine, Ser: 4.56 mg/dL — ABNORMAL HIGH (ref 0.44–1.00)
GFR calc non Af Amer: 8 mL/min — ABNORMAL LOW (ref >60)
GFR, EST AFRICAN AMERICAN: 10 mL/min — AB (ref >60)
GLUCOSE: 85 mg/dL (ref 65–99)
PHOSPHORUS: 2.8 mg/dL (ref 2.5–4.6)
POTASSIUM: 4 mmol/L (ref 3.5–5.1)
Sodium: 139 mmol/L (ref 135–145)

## 2017-03-30 MED ORDER — FUROSEMIDE 40 MG PO TABS
40.0000 mg | ORAL_TABLET | Freq: Every day | ORAL | Status: DC
  Administered 2017-03-30 – 2017-03-31 (×2): 40 mg via ORAL
  Filled 2017-03-30 (×2): qty 1

## 2017-03-30 MED ORDER — AMLODIPINE BESYLATE 5 MG PO TABS
5.0000 mg | ORAL_TABLET | Freq: Once | ORAL | Status: AC
  Administered 2017-03-30: 5 mg via ORAL
  Filled 2017-03-30: qty 1

## 2017-03-30 MED ORDER — AMLODIPINE BESYLATE 10 MG PO TABS
10.0000 mg | ORAL_TABLET | Freq: Every day | ORAL | Status: DC
  Administered 2017-03-31: 10 mg via ORAL
  Filled 2017-03-30: qty 1

## 2017-03-30 NOTE — Progress Notes (Addendum)
  Progress Note    03/30/2017 7:36 AM 6 Days Post-Op  Subjective:  No new complaints this am   Vitals:   03/29/17 2349 03/30/17 0428  BP: (!) 154/82 (!) 156/79  Pulse: 71 73  Resp: (!) 23 (!) 22  Temp: 98 F (36.7 C) 98 F (36.7 C)  SpO2: 94% 90%   Physical Exam: Lungs:  Non labored Incisions:  abd incision stable Extremities:  Feet symmetrically warm Abdomen:  Soft, nontender, +BS Neurologic: A&O  CBC    Component Value Date/Time   WBC 12.2 (H) 03/27/2017 0532   RBC 3.27 (L) 03/27/2017 0532   HGB 9.9 (L) 03/27/2017 0532   HCT 29.5 (L) 03/27/2017 0532   PLT 124 (L) 03/27/2017 0532   MCV 90.2 03/27/2017 0532   MCH 30.3 03/27/2017 0532   MCHC 33.6 03/27/2017 0532   RDW 16.6 (H) 03/27/2017 0532    BMET    Component Value Date/Time   NA 139 03/30/2017 0219   K 4.0 03/30/2017 0219   CL 106 03/30/2017 0219   CO2 22 03/30/2017 0219   GLUCOSE 85 03/30/2017 0219   BUN 52 (H) 03/30/2017 0219   CREATININE 4.56 (H) 03/30/2017 0219   CALCIUM 8.3 (L) 03/30/2017 0219   GFRNONAA 8 (L) 03/30/2017 0219   GFRAA 10 (L) 03/30/2017 0219    INR    Component Value Date/Time   INR 1.16 03/25/2017 0419    No intake or output data in the 24 hours ending 03/30/17 0736   Assessment/Plan:  83 y.o. female is s/p aortobi-iliac bypass 6 Days Post-Op   No rest pain; perfusing BLE well PT/OT recommending SNF; social work consulted for discharge planning Nephrology ok for d/c D/c when SNF bed accepted   Dagoberto Ligas, PA-C [facility] [phone] 03/30/2017 7:36 AM  I have independently interviewed and examined patient and agree with PA assessment and plan above.   Pharrah Rottman C. Donzetta Matters, MD [facility] [place] Office: [phone] Pager: [phone]

## 2017-03-30 NOTE — Progress Notes (Signed)
[facility] Rounding Note Background: Pt followed by Dr. Moshe Cipro. Baseline creatinine 1.23 June 2016. 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 Creatinine started trending ^ 12/2016 2.2, then 2.9.  Renal US  - size discrepancy atrophic R and AAA. CT scan 7.3 cm AAA, extensive athero, multiple bilateral renal cysts, AAA repair (aortoiliac bypass) by Dr. Donzetta Matters 3/7. Creatinine 4.23 pre-op. Transient intra op need for phenylephrine. 700 EBL with 300 cell saver returned. 3.5 L fluids. Aorta clamped below renals. Post op AKI   Assessment/Plan 1. CKD: Follows with Dr. Moshe Cipro. Likely ischemic nephropathy as initial etiology  1. Prior baseline creatinine 1.6 and had uptrended to  4.23 preoperatively w/recognition of the AAA.   2. Post op 3.75  Rising since, up to 5 Sun- 4.79 yest, 4.69 today  2. Seemingly Good UOP- not uremic- no indications for HD-I am comfortable for pt to be discharged to SNF-  BP med adjustments made today to good home regimen - when discharged I can follow her renal function as OP 3. Still  + from admission but good UOP - add back home dose lasix 4. AAA s/p aorto bi-iliac bypass: s/p repair 3/7 with Dr. Donzetta Matters.  5. HTN: Briefly on phenylephrine during repair. BR drifting up, amlodipine/metoprolol- will take some time for amlodipine to kick in- baseline has very high BP - BP not ideal yet- will increase amlodipine to 10 daily.  Resume her home lasix 40 daily  6. ABLA - Hb down 14.4 pre op ->8.7->7. 2/p 2 units 3/9. Hb today back to 9.9- I think will rebound as OP 7. Hypokalemia- repleted yesterday - good today   Renal will sign off- I will make sure appropriate follow up with me  Marily Konczal A 03/27/2017, 10:05 AM  Subjective/Interval History:  No UOP recorded but had some - creatinine about the same, maybe even a little better   Objective Vital signs in last 24 hours: Vitals:   03/29/17 1921 03/29/17 2349 03/30/17 0428 03/30/17 0757  BP: (!) 163/78 (!)  154/82 (!) 156/79 (!) 142/99  Pulse: 71 71 73 78  Resp: 18 (!) 23 (!) 22 (!) 23  Temp: 97.6 F (36.4 C) 98 F (36.7 C) 98 F (36.7 C) 97.7 F (36.5 C)  TempSrc: Oral Oral Oral Oral  SpO2: 96% 94% 90% 97%  Weight:      Height:       Weight change:  No intake or output data in the 24 hours ending 03/30/17 1009 Physical Exam:  Blood pressure (!) 142/99, pulse 78, temperature 97.7 F (36.5 C), temperature source Oral, resp. rate (!) 23, height 5\' 5"  (1.651 m), weight 77.9 kg (171 lb 11.8 oz), SpO2 97 %.  Looks comfortable, NAD Daughter in with her, questions answered Lungs decr BS but clear S1S2 No S3 Abd long midline surgical scar incisions clean and dry + BS No LE edema so far Couple of blue dots R plantar, no stigmata atheremboli left  Recent Labs  Lab 03/24/17 1237 03/25/17 0419 03/26/17 0356 03/27/17 0532 03/28/17 0239 03/29/17 0216 03/30/17 0219  NA 139 141 143 143 139 139 139  K 3.9 4.1 3.9 3.8 3.4* 3.3* 4.0  CL 109 108 109 108 105 104 106  CO2 21* 21* 20* 22 23 24 22   GLUCOSE 145* 120* 106* 93 85 99 85  BUN 39* 44* 50* 50* 49* 53* 52*  CREATININE 3.75* 4.24* 4.95* 5.02* 4.79* 4.69* 4.56*  CALCIUM 7.7* 7.6* 8.0* 8.2* 8.3* 8.2* 8.3*  PHOS  --   --  6.0* 6.0* 3.9 3.4 2.8    Recent Labs  Lab 03/28/17 0239 03/29/17 0216 03/30/17 0219  ALBUMIN 2.3* 2.2* 2.4*    Recent Labs  Lab 03/24/17 1237 03/25/17 0419 03/26/17 0356 03/26/17 1515 03/27/17 0532  WBC 20.6* 10.9* 14.5*  --  12.2*  HGB 10.0* 8.7* 7.0* 9.9* 9.9*  HCT 29.8* 26.4* 21.6* 30.9* 29.5*  MCV 94.3 95.3 95.6  --  90.2  PLT 170 145* 129*  --  124*    Recent Labs  Lab 03/25/17 2322 03/26/17 0259 03/26/17 0823 03/26/17 1159 03/26/17 1633  GLUCAP 101* 102* 96 91 83    Dg Chest Port 1 View Result Date: 03/24/2017 CLINICAL DATA:  AAA repair EXAM: PORTABLE CHEST 1 VIEW COMPARISON:  09/21/2011 FINDINGS: There is diffuse bilateral interstitial thickening. There is a possible trace left pleural  effusion. There is no right pleural effusion. There is no pneumothorax. There is stable cardiomegaly. There is thoracic aortic atherosclerosis. There is a right-sided Swan-Ganz catheter with the tip projecting over the right ventricular outflow tract. There is a nasogastric tube coursing below the diaphragm. The osseous structures are unremarkable. IMPRESSION: 1. Cardiomegaly with mild pulmonary vascular congestion. 2. Swan-Ganz catheter with the tip projecting over the right ventricular outflow tract. Electronically Signed   By: Kathreen Devoid   On: 03/24/2017 12:58   Medications: . sodium chloride    . magnesium sulfate 1 - 4 g bolus IVPB     . amLODipine  5 mg Oral Daily  . aspirin EC  81 mg Oral Daily  . docusate sodium  100 mg Oral Daily  . heparin  5,000 Units Subcutaneous Q8H  . levothyroxine  88 mcg Oral QAC breakfast  . metoprolol tartrate  50 mg Oral BID  . pantoprazole  40 mg Oral Daily  . rosuvastatin  10 mg Oral Daily

## 2017-03-30 NOTE — NC FL2 (Signed)
[place] LEVEL OF CARE SCREENING TOOL     IDENTIFICATION  Patient Name: Kristin Gross Birthdate: 05-13-1934 Sex: female Admission Date (Current Location): 03/24/2017  [employer] and [place] Number:  Herbalist and Address:  [facility], [place] [address], [place], [place] [address]      Provider Number: [id number]  Attending Physician Name and Address:  [place], [place] Name and Phone Number:  Alaena Strader, [phone]    Current Level of Care: Hospital Recommended Level of Care: [place] Prior Approval Number:    Date Approved/Denied:   PASRR Number: [id number]  Discharge Plan: SNF    Current Diagnoses: Patient Active Problem List   Diagnosis Date Noted  . AAA (abdominal aortic aneurysm) ([place]) 03/24/2017  . Preoperative cardiovascular examination 03/14/2017  . Hypertensive chronic kidney disease 03/14/2017  . AAA (abdominal aortic aneurysm) without rupture ([place]) 03/14/2017    Orientation RESPIRATION BLADDER Height & Weight     Self, Time, Situation, Place  Normal Continent Weight: 171 lb 11.8 oz (77.9 kg) Height:  5\' 5"  (409.7 cm)  BEHAVIORAL SYMPTOMS/MOOD NEUROLOGICAL BOWEL NUTRITION STATUS      Continent Diet(regular)  AMBULATORY STATUS COMMUNICATION OF NEEDS Skin   Limited Assist Verbally Normal                       Personal Care Assistance Level of Assistance  Bathing, Feeding, Dressing Bathing Assistance: Limited assistance Feeding assistance: Independent Dressing Assistance: Limited assistance     Functional Limitations Info  Sight, Hearing, Speech Sight Info: Adequate Hearing Info: Adequate Speech Info: Adequate    SPECIAL CARE FACTORS FREQUENCY  OT (By licensed OT), PT (By licensed PT)     PT Frequency: 5x wk OT Frequency: 5x wk            Contractures Contractures Info: Not present    Additional Factors Info  Code Status, Allergies Code Status  Info: Full Code Allergies Info: Lisinopril           Current Medications (03/30/2017):  This is the current hospital active medication list Current Facility-Administered Medications  Medication Dose Route Frequency Provider Last Rate Last Dose  . 0.9 %  sodium chloride infusion  500 mL Intravenous Once PRN Dagoberto Ligas, PA-C      . acetaminophen (TYLENOL) tablet 325-650 mg  325-650 mg Oral Q4H PRN Dagoberto Ligas, PA-C   650 mg at 03/28/17 1111   Or  . acetaminophen (TYLENOL) suppository 325-650 mg  325-650 mg Rectal Q4H PRN Dagoberto Ligas, PA-C      . [START ON 03/31/2017] amLODipine (NORVASC) tablet 10 mg  10 mg Oral Daily Corliss Parish, MD      . aspirin EC tablet 81 mg  81 mg Oral Daily Laurence Slate M, PA-C   81 mg at 03/30/17 0901  . docusate sodium (COLACE) capsule 100 mg  100 mg Oral Daily Dagoberto Ligas, PA-C   100 mg at 03/30/17 0901  . furosemide (LASIX) tablet 40 mg  40 mg Oral Daily Corliss Parish, MD   40 mg at 03/30/17 1045  . guaiFENesin-dextromethorphan (ROBITUSSIN DM) 100-10 MG/5ML syrup 15 mL  15 mL Oral Q4H PRN Dagoberto Ligas, PA-C      . heparin injection 5,000 Units  5,000 Units Subcutaneous Q8H Dagoberto Ligas, PA-C   5,000 Units at 03/30/17 0736  . hydrALAZINE (APRESOLINE) injection 5 mg  5 mg Intravenous Q20 Min PRN Dagoberto Ligas, PA-C      .  labetalol (NORMODYNE,TRANDATE) injection 10 mg  10 mg Intravenous Q10 min PRN Dagoberto Ligas, PA-C   10 mg at 03/26/17 1629  . levothyroxine (SYNTHROID, LEVOTHROID) tablet 88 mcg  88 mcg Oral QAC breakfast Laurence Slate M, PA-C   88 mcg at 03/30/17 9485  . magnesium sulfate IVPB 2 g 50 mL  2 g Intravenous Daily PRN Dagoberto Ligas, PA-C      . metoprolol tartrate (LOPRESSOR) injection 2-5 mg  2-5 mg Intravenous Q2H PRN Dagoberto Ligas, PA-C      . metoprolol tartrate (LOPRESSOR) tablet 50 mg  50 mg Oral BID Laurence Slate M, PA-C   50 mg at 03/30/17 0901  . morphine 2 MG/ML injection 1-2 mg  1-2 mg  Intravenous Q1H PRN Early, Arvilla Meres, MD   1 mg at 03/26/17 1449  . ondansetron (ZOFRAN) injection 4 mg  4 mg Intravenous Q6H PRN Dagoberto Ligas, PA-C   4 mg at 03/25/17 1121  . oxyCODONE-acetaminophen (PERCOCET/ROXICET) 5-325 MG per tablet 1-2 tablet  1-2 tablet Oral Q4H PRN Dagoberto Ligas, PA-C      . pantoprazole (PROTONIX) EC tablet 40 mg  40 mg Oral Daily Dagoberto Ligas, PA-C   40 mg at 03/30/17 0901  . phenol (CHLORASEPTIC) mouth spray 1 spray  1 spray Mouth/Throat PRN Dagoberto Ligas, PA-C      . rosuvastatin (CRESTOR) tablet 10 mg  10 mg Oral Daily Laurence Slate M, PA-C   10 mg at 03/29/17 1640     Discharge Medications: Please see discharge summary for a list of discharge medications.  Relevant Imaging Results:  Relevant Lab Results:   Additional Information SS# [id number]  Wende Neighbors, LCSW

## 2017-03-30 NOTE — Clinical Social Work Note (Signed)
Clinical Social Work Assessment  Patient Details  Name: Kristin Gross MRN: [id number] Date of Birth: 06-May-1934  Date of referral:  03/30/17               Reason for consult:  Discharge Planning, Facility Placement                Permission sought to share information with:  Family Supports Permission granted to share information::  Yes, Verbal Permission Granted  Name::     Kristin Gross  Agency::  snf  Relationship::  spouse  Contact Information:  [phone]  Housing/Transportation Living arrangements for the past 2 months:  Single Family Home Source of Information:  Patient, Adult Children Patient Interpreter Needed:  None Criminal Activity/Legal Involvement Pertinent to Current Situation/Hospitalization:  No - Comment as needed Significant Relationships:  Adult Children, Spouse Lives with:  Spouse Do you feel safe going back to the place where you live?  Yes Need for family participation in patient care:  Yes (Comment)  Care giving concerns:  Patient daughter Lattie Haw at bedside. Patient stated she lives with her husband in the home and has support from other family members.   Social Worker assessment / plan:  CSW met patient and daughter at bedside. Patient stated that her husband is on the way and she will relay conversation with husband. Patient stated she has though about rehab because her husband is elderly and moves slow. Patient and daughter stated that they would prefer [facility] since husband is a [employer]. CSW to follow up with family once bed offers are available.   Employment status:  Retired Nurse, adult PT Recommendations:  [place] / Referral to community resources:  [place]  Patient/Family's Response to care:  Patient is agreeable to discharge to rehab and is appreciative of the treatment she has received from con.   Patient/Family's Understanding of and Emotional Response to Diagnosis,  Current Treatment, and Prognosis:  Family supportive of patient. Patient has good understanding of recent hospitalization.   Emotional Assessment Appearance:  Appears stated age Attitude/Demeanor/Rapport:  Engaged Affect (typically observed):  Accepting, Pleasant Orientation:  Oriented to Self, Oriented to Place, Oriented to  Time, Oriented to Situation Alcohol / Substance use:  Not Applicable Psych involvement (Current and /or in the community):  No (Comment)  Discharge Needs  Concerns to be addressed:    Readmission within the last 30 days:  No Current discharge risk:  None Barriers to Discharge:  No Barriers Identified   Wende Neighbors, LCSW 03/30/2017, 9:50 AM

## 2017-03-30 NOTE — Care Management Note (Signed)
Case Management Note Marvetta Gibbons RN, BSN Unit 4E-Case Manager [phone]  Patient Details  Name: Kristin Gross MRN: [id number] Date of Birth: [birth year]  Subjective/Objective:    Pt admitted s/p AAA repair                Action/Plan: PTA pt lived at home with spouse- order for HHPT- however per PT/OT recommendations pt would benefit from STSNF due to not having 24 hr assistance at home- her husband id medically frail and can not assist her- she is the caregiver for him. CSW has been consulted to assist in placement needs.   Expected Discharge Date:                  Expected Discharge Plan:  Skilled Nursing Facility  In-House Referral:  Clinical Social Work  Discharge planning Services  CM Consult  Post Acute Care Choice:  Home Health Choice offered to:     DME Arranged:    DME Agency:     HH Arranged:  PT HH Agency:     Status of Service:  In process, will continue to follow  If discussed at Long Length of Stay Meetings, dates discussed:    Discharge Disposition: skilled facility   Additional Comments:  Dawayne Patricia, RN 03/30/2017, 12:11 PM

## 2017-03-30 NOTE — Plan of Care (Signed)
  Progressing Education: Knowledge of General Education information will improve 03/30/2017 2348 - Progressing by Drenda Freeze, RN Health Behavior/Discharge Planning: Ability to manage health-related needs will improve 03/30/2017 2348 - Progressing by Drenda Freeze, RN Clinical Measurements: Ability to maintain clinical measurements within normal limits will improve 03/30/2017 2348 - Progressing by Drenda Freeze, RN Will remain free from infection 03/30/2017 2348 - Progressing by Drenda Freeze, RN

## 2017-03-31 LAB — RENAL FUNCTION PANEL
ANION GAP: 13 (ref 5–15)
Albumin: 2.5 g/dL — ABNORMAL LOW (ref 3.5–5.0)
BUN: 52 mg/dL — ABNORMAL HIGH (ref 6–20)
CHLORIDE: 105 mmol/L (ref 101–111)
CO2: 22 mmol/L (ref 22–32)
Calcium: 8.7 mg/dL — ABNORMAL LOW (ref 8.9–10.3)
Creatinine, Ser: 4.48 mg/dL — ABNORMAL HIGH (ref 0.44–1.00)
GFR calc Af Amer: 10 mL/min — ABNORMAL LOW (ref >60)
GFR calc non Af Amer: 8 mL/min — ABNORMAL LOW (ref >60)
GLUCOSE: 80 mg/dL (ref 65–99)
Phosphorus: 3.2 mg/dL (ref 2.5–4.6)
Potassium: 4.1 mmol/L (ref 3.5–5.1)
Sodium: 140 mmol/L (ref 135–145)

## 2017-03-31 MED ORDER — OXYCODONE HCL 5 MG PO TABS: 5.0000 mg | ORAL_TABLET | Freq: Three times a day (TID) | ORAL | 0 refills | Status: DC | PRN

## 2017-03-31 MED ORDER — AMLODIPINE BESYLATE 10 MG PO TABS: 10.0000 mg | ORAL_TABLET | Freq: Every day | ORAL | 1 refills | Status: DC

## 2017-03-31 NOTE — Progress Notes (Signed)
Occupational Therapy Treatment Patient Details Name: Kristin Gross MRN: [id number] DOB: 1934/07/29 Today's Date: 03/31/2017    History of present illness  83 year old female with history of chronic kidney disease who was found to have 6.8 cm abdominal aortic aneurysm. PMH: CKD, HTN, PONV   OT comments  Pt progresing towards OT goals this session, able to improve with ambulation to bathroom, toilet transfer, peri care, followed by sink level grooming. Pt complaining of lower back pain throughout session while in standing(burning) that goes away when she "marches". Pt continues to requires SNF level therapy as safest option as she is the caregiver for her husband and he cannot assist her at all. OT will continue to follow acutely prior to placement as indicated below.   Follow Up Recommendations  SNF;Supervision/Assistance - 24 hour    Equipment Recommendations  3 in 1 bedside commode    Recommendations for Other Services      Precautions / Restrictions Precautions Precautions: Fall Restrictions Weight Bearing Restrictions: No       Mobility Bed Mobility Overal bed mobility: Needs Assistance Bed Mobility: Supine to Sit     Supine to sit: Supervision     General bed mobility comments: use of bed rail to assist - no physical asssit from OT  Transfers Overall transfer level: Needs assistance Equipment used: Rolling walker (2 wheeled) Transfers: Sit to/from Stand Sit to Stand: Min guard         General transfer comment: vc's for hand placement, min-guard for safety    Balance Overall balance assessment: Mild deficits observed, not formally tested                                         ADL either performed or assessed with clinical judgement   ADL Overall ADL's : Needs assistance/impaired     Grooming: Min guard;Standing;Wash/dry hands;Wash/dry face;Oral care Grooming Details (indicate cue type and reason): sink level             Lower  Body Dressing: Minimal assistance;Sit to/from stand   Toilet Transfer: Minimal assistance;Ambulation;RW;Regular Toilet;Grab bars Toilet Transfer Details (indicate cue type and reason): vc for safety with RW an transfers Toileting- Clothing Manipulation and Hygiene: Minimal assistance;Sit to/from stand       Functional mobility during ADLs: Minimal assistance;Rolling walker       Vision   Vision Assessment?: No apparent visual deficits   Perception     Praxis      Cognition Arousal/Alertness: Awake/alert Behavior During Therapy: WFL for tasks assessed/performed Overall Cognitive Status: Within Functional Limits for tasks assessed                                          Exercises     Shoulder Instructions       General Comments Pt's daughter present throughout session    Pertinent Vitals/ Pain       Pain Assessment: 0-10 Pain Score: 5  Faces Pain Scale: Hurts little more Pain Location: lower back Pain Descriptors / Indicators: Grimacing;Burning Pain Intervention(s): Monitored during session;Repositioned  Home Living  Prior Functioning/Environment              Frequency  Min 2X/week        Progress Toward Goals  OT Goals(current goals can now be found in the care plan section)  Progress towards OT goals: Progressing toward goals  Acute Rehab OT Goals Patient Stated Goal: be strong enough to care for husband before home OT Goal Formulation: With patient/family Time For Goal Achievement: 04/12/17 Potential to Achieve Goals: Good  Plan Frequency remains appropriate;Discharge plan needs to be updated    Co-evaluation                 AM-PAC PT "6 Clicks" Daily Activity     Outcome Measure   Help from another person eating meals?: None Help from another person taking care of personal grooming?: A Little Help from another person toileting, which includes using  toliet, bedpan, or urinal?: A Little Help from another person bathing (including washing, rinsing, drying)?: A Little Help from another person to put on and taking off regular upper body clothing?: A Little Help from another person to put on and taking off regular lower body clothing?: A Little 6 Click Score: 19    End of Session Equipment Utilized During Treatment: Gait belt;Rolling walker  OT Visit Diagnosis: Other abnormalities of gait and mobility (R26.89);Pain Pain - part of body: (lower back/abdomen)   Activity Tolerance Patient tolerated treatment well   Patient Left in chair;with call bell/phone within reach;with family/visitor present(about to work with PT)   Nurse Communication Mobility status        Time: 7972-8206 OT Time Calculation (min): 26 min  Charges: OT General Charges $OT Visit: 1 Visit OT Treatments $Self Care/Home Management : 8-22 mins $Therapeutic Activity: 8-22 mins  Hulda Humphrey OTR/L [place] 03/31/2017, 11:10 AM

## 2017-03-31 NOTE — Progress Notes (Signed)
Clinical Social Worker met patient and family (spouse, daughter and a female family member that did not identify himself) at bedside. CSW was following up with family to see if they had made a decision on SNF. Patient has met with admission coordinator at [facility] yesterday to inquire about private beds at there facility. Patient stated that because [facility] does not have a private bed she has decided on going home with family. CSW explained to patient that another facility does have a private bed and authorization through insurance has been attained.   Patient thanked CSW for her role in her care but stated she will just discharge home. Patients daughter Lattie Haw) and spouse was at bedside. Lattie Haw stated she has taken a week off of work and will stay with patient and spouse to assist patient with her needs. Family members in the room was in agreement with patient plan. Family stated that patient will have 24/hr care at home. Patient and family are in agreement for patient to discharge home with home health to follow. CWS to make RNCM aware of family's decision. CSW has contacted patients MD and PA to make them aware that patient has decided to go home with home health.   Rhea Pink, MSW,  [place]

## 2017-03-31 NOTE — Progress Notes (Addendum)
  Progress Note    03/31/2017 7:19 AM 7 Days Post-Op  Subjective:  BM yesterday.  She is anxiously awaiting discharge to SNF today.   Vitals:   03/30/17 2357 03/31/17 0305  BP: (!) 159/78   Pulse:    Resp:    Temp: 98.7 F (37.1 C) 98.7 F (37.1 C)  SpO2:     Physical Exam: Lungs:  Non labored Incisions:  abd incision stable, unremarkable, healing well Extremities:  Palpable L AT; faintly palpable R AT Abdomen:  Soft; +BS, non tender Neurologic: A&O  CBC    Component Value Date/Time   WBC 12.2 (H) 03/27/2017 0532   RBC 3.27 (L) 03/27/2017 0532   HGB 9.9 (L) 03/27/2017 0532   HCT 29.5 (L) 03/27/2017 0532   PLT 124 (L) 03/27/2017 0532   MCV 90.2 03/27/2017 0532   MCH 30.3 03/27/2017 0532   MCHC 33.6 03/27/2017 0532   RDW 16.6 (H) 03/27/2017 0532    BMET    Component Value Date/Time   NA 140 03/31/2017 0259   K 4.1 03/31/2017 0259   CL 105 03/31/2017 0259   CO2 22 03/31/2017 0259   GLUCOSE 80 03/31/2017 0259   BUN 52 (H) 03/31/2017 0259   CREATININE 4.48 (H) 03/31/2017 0259   CALCIUM 8.7 (L) 03/31/2017 0259   GFRNONAA 8 (L) 03/31/2017 0259   GFRAA 10 (L) 03/31/2017 0259    INR    Component Value Date/Time   INR 1.16 03/25/2017 0419     Intake/Output Summary (Last 24 hours) at 03/31/2017 0719 Last data filed at 03/30/2017 2000 Gross per 24 hour  Intake 240 ml  Output 250 ml  Net -10 ml     Assessment/Plan:  83 y.o. female is s/p aortobi-iliac bypass 7 Days Post-Op   Perfusing BLE well 1 week post op Nephrology signed off and will follow as an outpatient D/c pending insurance auth and bed approval with SNF  DVT prophylaxis:  subq hep   Dagoberto Ligas, PA-C [facility] [phone] 03/31/2017 7:19 AM  I have independently interviewed and examined patient and agree with PA assessment and plan above.   Abdulahad Mederos C. Donzetta Matters, MD [facility] [place] Office: [phone] Pager: [phone]

## 2017-03-31 NOTE — Progress Notes (Signed)
Physical Therapy Treatment Patient Details Name: Kristin Gross MRN: [id number] DOB: 13-Aug-1934 Today's Date: 03/31/2017    History of Present Illness  83 year old female with history of chronic kidney disease who was found to have 6.8 cm abdominal aortic aneurysm. PMH: CKD, HTN, PONV    PT Comments    Pt performed gait and functional mobility including stair training.  She remains to present with safety concerns and required min assistance to perform stair training as she does not have rails to enter her home.  Short term SNF remains the safest option to allow patient to have 24 hour assistance and rehab to return home in a more independent state.  Plan next session for gait training and introducing strengthening exercises.    Follow Up Recommendations  Supervision/Assistance - 24 hour;SNF(Pt does not have 24 hour assistance at home and will benefit from SNF as safest option.  Husband can not help physically.  )     Equipment Recommendations  None recommended by PT    Recommendations for Other Services       Precautions / Restrictions Precautions Precautions: Fall Restrictions Weight Bearing Restrictions: No    Mobility  Bed Mobility               General bed mobility comments: Pt in recliner on arrival.    Transfers Overall transfer level: Needs assistance Equipment used: Rolling walker (2 wheeled) Transfers: Sit to/from Stand Sit to Stand: Min guard         General transfer comment: vc's for hand placement, min-guard for safety  Ambulation/Gait Ambulation/Gait assistance: Min guard;Supervision Ambulation Distance (Feet): 120 Feet Assistive device: Rolling walker (2 wheeled) Gait Pattern/deviations: Step-through pattern;Decreased stride length;Trunk flexed Gait velocity: decreased Gait velocity interpretation: Below normal speed for age/gender General Gait Details: Pt with improved respiratory function no signs of SHOB.  Pt remains to require cues for  safety with RW and forward gaze.  Close chair follow used for safety when patient fatigues.     Stairs Stairs: Yes   Stair Management: No rails;Forwards(with B HHA.  ) Number of Stairs: 4 General stair comments: Cues for sequencing and use of B HHA to maintain balance during stair negotiation.  Pt required min assistance.    Wheelchair Mobility    Modified Rankin (Stroke Patients Only)       Balance Overall balance assessment: Mild deficits observed, not formally tested                                          Cognition Arousal/Alertness: Awake/alert Behavior During Therapy: WFL for tasks assessed/performed Overall Cognitive Status: Within Functional Limits for tasks assessed                                        Exercises      General Comments        Pertinent Vitals/Pain Pain Assessment: Faces Faces Pain Scale: Hurts little more Pain Location: abdomen Pain Descriptors / Indicators: Grimacing Pain Intervention(s): Monitored during session;Repositioned    Home Living                      Prior Function            PT Goals (current goals can now be found in the care plan  section) Acute Rehab PT Goals Patient Stated Goal: be strong enough to care for husband before home Potential to Achieve Goals: Good Progress towards PT goals: Progressing toward goals    Frequency    Min 3X/week      PT Plan Current plan remains appropriate    Co-evaluation              AM-PAC PT "6 Clicks" Daily Activity  Outcome Measure  Difficulty turning over in bed (including adjusting bedclothes, sheets and blankets)?: A Little Difficulty moving from lying on back to sitting on the side of the bed? : A Little Difficulty sitting down on and standing up from a chair with arms (e.g., wheelchair, bedside commode, etc,.)?: Unable Help needed moving to and from a bed to chair (including a wheelchair)?: A Little Help needed  walking in hospital room?: A Little Help needed climbing 3-5 steps with a railing? : A Little 6 Click Score: 16    End of Session Equipment Utilized During Treatment: Gait belt Activity Tolerance: Patient tolerated treatment well Patient left: in chair;with call bell/phone within reach;with family/visitor present Nurse Communication: Mobility status PT Visit Diagnosis: Unsteadiness on feet (R26.81);Muscle weakness (generalized) (M62.81);Pain Pain - part of body: (abdomen)     Time: 2229-7989 PT Time Calculation (min) (ACUTE ONLY): 17 min  Charges:  $Gait Training: 8-22 mins                    G Codes:       Governor Rooks, PTA pager [phone]    Cristela Blue 03/31/2017, 10:37 AM

## 2017-03-31 NOTE — Care Management Note (Addendum)
Case Management Note Kristin Gibbons RN, BSN Unit 4E-Case Manager [phone]  Patient Details  Name: [name] MRN: [id number] Date of Birth: 1934/06/12  Subjective/Objective:    Pt admitted s/p AAA repair                Action/Plan: PTA pt lived at home with spouse- order for HHPT- however per PT/OT recommendations pt would benefit from STSNF due to not having 24 hr assistance at home- her husband id medically frail and can not assist her- she is the caregiver for him. CSW has been consulted to assist in placement needs.   Expected Discharge Date:    03/31/17              Expected Discharge Plan:  Skilled Nursing Facility  In-House Referral:  Clinical Social Work  Discharge planning Services  CM Consult  Post Acute Care Choice:  Home Health Choice offered to:  Patient  DME Arranged:    DME Agency:     HH Arranged:  PT/OT HH Agency:  [place]  Status of Service:  Completed, signed off  If discussed at [place] of Stay Meetings, dates discussed:  3/14  Discharge Disposition: home/home health   Additional Comments:  03/31/17- 1430- Terence Bart RN, CM- pt stable to discharge- per CSW pt has insurance auth for STSNF bed- however has told CSW that she no longer wants to go to SNF and her daughter is going to come stay with her for a week to assist her in the home. Pt would benefit from [facility] services. Spoke with pt at bedside, choice offered for [facility] services, pt agreeable and states she has used [facility] in the past- have called and spoken with vascular PA- Gwenette Greet to notify of pt's change in mind regarding d/c plan- and ask for [facility] orders for PT/OT. Pt aware that it will be up to MD on if she discharges today vs in the AM. Referral called to Butch Penny with [facility] for [facility] needs. Per pt she has all needed DME at home including RW and BSC.   Dawayne Patricia, RN 03/31/2017, 3:09 PM

## 2017-03-31 NOTE — Discharge Instructions (Signed)
 [facility] [place]  Discharge Instructions   Open Aortic Surgery  Please refer to the following instructions for your post-procedure care. Your surgeon or Physician Assistant will discuss any changes with you.  Activity  Avoid lifting more than eight pounds (a gallon of milk) until after your first post-operative visit. You are encouraged to walk as much as you can. You can slowly return to normal activities but must avoid strenuous activity and heavy lifting until your doctor tells you it's okay. Heavy lifting can hurt the incision and cause a hernia. Avoid activities such as vacuuming or swinging a golf club. It is normal to feel tired for several weeks after your surgery. Do not drive until your doctor gives the okay and you are no longer taking prescription pain medications. It is also normal to have difficulty with sleep habits, eating and bowl movements after surgery. These will go away with time.  Bathing/Showering  Shower daily after you go home. Do not soak in a bathtub, hot tub, or swim until the incision heals.  Incision Care  Shower every day. Clean your incision with mild soap and water. Pat the area dry with a clean towel. You do not need a bandage unless otherwise instructed. Do not apply any ointments or creams to your incision. You may have skin glue on your incision. Do not peel it off. It will come off on its own in about one week. If you have staples or sutures along your incision, they will be removed at your post op appointment.  If you have groin incisions, wash the groin wounds with soap and water daily and pat dry. (No tub bath-only shower)  Then put a dry gauze or washcloth in the groin to keep this area dry to help prevent wound infection.  Do this daily and as needed.  Do not use Vaseline or neosporin on your incisions.  Only use soap and water on your incisions and then protect and keep dry.  Diet  Resume your normal diet. There are no  special food restriction following this procedure. A low fat/low cholesterol diet is recommended for all patients with vascular disease. After your aortic surgery, it's normal to feel full faster than usual and to not feel as hungry as you normally would. You will probably lose weight initially following your surgery. It's best to eat small, frequent meals over the course of the day. Call the office if you find that you are unable to eat even small meals.   In order to heal from your surgery, it is CRITICAL to get adequate nutrition. Your body requires vitamins, minerals, and protein. Vegetables are the best source of vitamins and minerals. If you have pain, you may take over-the-counter pain reliever such as acetaminophen (Tylenol). If you were prescribed a stronger pain medication, please be aware these medication can cause nausea and constipation. Prevent nausea by taking the medication with a snack or meal. Avoid constipation by drinking plenty of fluids and eating foods with a high amount of fiber, such as fruits, vegetables and grains. Take 100mg of the over-the-counter stool softener Colace twice a day as needed to help with constipation. A laxative, such as Milk of Magnesia, may be recommended for you at this time. Do not take a laxative unless your surgeon or P.A. tells you it's OK.  Do not take Tylenol if you are taking stronger pain medications (such as Percocet).  Follow Up  Our office will schedule a follow up   appointment 2-3 weeks after discharge.  Please call us immediately for any of the following conditions    .     Severe or worsening pain in your legs or feet or in your abdomen back or chest. Increased pain, redness drainage (pus) from your incision site. Increased abdominal pain, bloating, nausea, vomiting, or persistent diarrhea. Fever of 101 degrees or higher. Swelling in your leg (s).  Reduce your risk of vascular disease  Stop smoking. If you would like help, call  [facility] at 1-800-QUIT-NOW (1-800-784-8669) or [place] at 336-586-4000. Manage your cholesterol Maintain a desired weight Control your diabetes Keep your blood pressure down  If you have any questions please call the office at [phone].   

## 2017-04-01 LAB — BPAM RBC
BLOOD PRODUCT EXPIRATION DATE: 201904012359
BLOOD PRODUCT EXPIRATION DATE: 201904032359
Blood Product Expiration Date: 201904012359
Blood Product Expiration Date: 201904042359
ISSUE DATE / TIME: 201903091155
ISSUE DATE / TIME: 201903090922
UNIT TYPE AND RH: 5100
UNIT TYPE AND RH: 5100
UNIT TYPE AND RH: 5100
Unit Type and Rh: 5100

## 2017-04-01 LAB — TYPE AND SCREEN
ABO/RH(D): O POS
ANTIBODY SCREEN: NEGATIVE
UNIT DIVISION: 0
Unit division: 0
Unit division: 0
Unit division: 0

## 2017-04-01 NOTE — Discharge Summary (Signed)
AAA Discharge Summary    Kristin Gross 13-Feb-1934 83 y.o. female  [id number]  Admission Date: 03/24/2017  Discharge Date: 03/31/17  Physician: Dr. Donzetta Matters  Admission Diagnosis: abdominal aortic aneurysm  Discharge Day services:   See progress note 03/31/17 Physical Exam: Vitals:   03/31/17 0751 03/31/17 1445  BP: (!) 161/74 (!) 145/70  Pulse: 63 62  Resp: (!) 21 (!) 24  Temp: 97.7 F (36.5 C) (!) 97.4 F (36.3 C)  SpO2: 94% 95%    Hospital Course:  The patient was admitted to the hospital and taken to the operating room on 03/24/2017 and underwent: Aorta by iliac bypass of AAA  The pt tolerated the procedure well and was transported to the PACU in fair condition.  She was extubated in PACU and was admitted to the intensive care unit postoperatively.  POD #1 she was transferred to a stepdown unit.  The remainder of the hospital course consisted of increasing mobilization and increasing intake of solids without difficulty. She was eventually advanced to a regular diet, had anticipated bowel movement, and was tolerating a home diet prior to discharge home.  Therapy teams were consulted and recommended short stay in a skilled nursing facility.  Preoperatively patient had diagnoses of chronic kidney disease.  Intraoperatively aorta was clamped below the renal arteries.  Nephrology was consulted postoperatively.  Management of CKD involved medical management, continued Foley for monitoring of intake and output.  She will follow-up with her nephrologist as an outpatient in the next several weeks.  At time of discharge urine output is around patient's baseline.  The last several days of inpatient stay involved case management and social work working on discharge placement.  Eventually no suitable skilled nurse facility placement was found and in discussion with patient's family patient was comfortable with discharge home as she had 24/7 support from family.  Home health PT and OT will also  be required and have been arranged prior to discharge.  Patient was prescribed several days of narcotic pain medication for continued postoperative pain control.  She will follow-up in office in about 2-3 weeks with Dr. Donzetta Matters.  Discharge instructions were reviewed with the family and patient and they voiced their understanding.  She was discharged home with home health PT/OT in stable condition.  CBC    Component Value Date/Time   WBC 12.2 (H) 03/27/2017 0532   RBC 3.27 (L) 03/27/2017 0532   HGB 9.9 (L) 03/27/2017 0532   HCT 29.5 (L) 03/27/2017 0532   PLT 124 (L) 03/27/2017 0532   MCV 90.2 03/27/2017 0532   MCH 30.3 03/27/2017 0532   MCHC 33.6 03/27/2017 0532   RDW 16.6 (H) 03/27/2017 0532    BMET    Component Value Date/Time   NA 140 03/31/2017 0259   K 4.1 03/31/2017 0259   CL 105 03/31/2017 0259   CO2 22 03/31/2017 0259   GLUCOSE 80 03/31/2017 0259   BUN 52 (H) 03/31/2017 0259   CREATININE 4.48 (H) 03/31/2017 0259   CALCIUM 8.7 (L) 03/31/2017 0259   GFRNONAA 8 (L) 03/31/2017 0259   GFRAA 10 (L) 03/31/2017 0259     Discharge Instructions    Discharge patient   Complete by:  As directed    Discharge disposition:  01-Home or Self Care   Discharge patient date:  03/31/2017      Discharge Diagnosis:  abdominal aortic aneurysm  Secondary Diagnosis: Patient Active Problem List   Diagnosis Date Noted  . AAA (abdominal aortic aneurysm) ([place]) 03/24/2017  .  Preoperative cardiovascular examination 03/14/2017  . Hypertensive chronic kidney disease 03/14/2017  . AAA (abdominal aortic aneurysm) without rupture ([place]) 03/14/2017   Past Medical History:  Diagnosis Date  . AAA (abdominal aortic aneurysm) ([place])   . Chronic kidney disease   . Hyperlipidemia   . Hypertension   . PONV (postoperative nausea and vomiting)   . Thyroid disease      Allergies as of 03/31/2017      Reactions   Lisinopril Swelling   SWELLING REACTION UNSPECIFIED       Medication List    TAKE  these medications   acetaminophen 500 MG tablet Commonly known as:  TYLENOL Take 500 mg by mouth daily as needed for moderate pain or headache.   amLODipine 10 MG tablet Commonly known as:  NORVASC Take 1 tablet (10 mg total) by mouth daily. What changed:    medication strength  how much to take   aspirin 81 MG tablet Take 81 mg by mouth daily.   furosemide 40 MG tablet Commonly known as:  LASIX Take 40 mg by mouth daily.   levothyroxine 88 MCG tablet Commonly known as:  SYNTHROID, LEVOTHROID Take 88 mcg by mouth daily before breakfast.   metoprolol tartrate 50 MG tablet Commonly known as:  LOPRESSOR Take 50 mg by mouth 2 (two) times daily.   oxyCODONE 5 MG immediate release tablet Commonly known as:  ROXICODONE Take 1 tablet (5 mg total) by mouth every 8 (eight) hours as needed for severe pain.   rosuvastatin 10 MG tablet Commonly known as:  CRESTOR Take 10 mg by mouth daily.   Vitamin D-3 1000 units Caps Take 2,000 Units by mouth daily.       Instructions:  [facility] Discharge Instructions Open Aortic Surgery  Please refer to the following instructions for your post-procedure care. Your surgeon or Physician Assistant will discuss any changes with you.  Activity  Avoid lifting more than eight pounds (a gallon of milk) until after your first post-operative visit. You are encouraged to walk as much as you can. You can slowly return to normal activities but must avoid strenuous activity and heavy lifting until your doctor tells you it's OK. Heavy lifting can hurt the incision and cause a hernia. Avoid activities such as vacuuming or swinging a golf club. It is normal to feel tired for several weeks after your surgery. Do not drive until your doctor gives the OK and you are no longer taking prescription pain medications. It is also normal to have difficulty with sleep habits, eating and bowl movements after surgery. These will go away  with time.  Bathing/Showering  You may shower after you go home. Do not soak in a bathtub, hot tub, or swim until the incision heals.  Incision Care  Shower every day. Clean your incision with mild soap and water. Pat the area dry with a clean towel. You do not need a bandage unless otherwise instructed. Do not apply any ointments or creams to your incision. You may have skin glue on your incision. Do not peel it off. It will come off on its own in about one week. If you have staples or sutures along your incision, they will be removed at your post op appointment.  If you have groin incisions, wash the groin wounds with soap and water daily and pat dry. (No tub bath-only shower)  Then put a dry gauze or washcloth in the groin to keep this area dry to  help prevent wound infection.  Do this daily and as needed.  Do not use Vaseline or neosporin on your incisions.  Only use soap and water on your incisions and then protect and keep dry.  Diet  Resume your normal diet. There are no special food restriction following this procedure. A low fat/low cholesterol diet is recommended for all patients with vascular disease. After your aortic surgery, it's normal to feel full faster than usual and to not feel as hungry as you normally would. You will probably lose weight initially following your surgery. It's best to eat small, frequent meals over the course of the day. Call the office if you find that you are unable to eat even small meals. In order to heal from your surgery, it is CRITICAL to get adequate nutrition. Your body requires vitamins, minerals, and protein. Vegetables are the best source of vitamins and minerals. causing pain, you may take over-the-counter pain reliever such as acetaminophen (Tylenol). If you were prescribed a stronger pain medication, please be aware these medication can cause nausea and constipation. Prevent nausea by taking the medication with a snack or meal. Avoid constipation by  drinking plenty of fluids and eating foods with a high amount of fiber, such as fruits, vegetables and grains. Take 100mg  of the over-the-counter stool softener Colace twice a day as needed to help with constipation. A laxative, such as Milk of Magnesia, may be recommended for you at this time. Do not take a laxative unless your surgeon or Physician Assistant. tells you it's OK. Do not take Tylenol if you are taking stronger pain medications (such as Percocet).  Follow Up  Our office will schedule a follow up appointment 2-3 weeks after discharge.  Please call us immediately for any of the following conditions    .     Severe or worsening pain in your legs or feet or in your abdomen back or chest. Increased pain, redness drainage (pus) from your incision site. Increased abdominal pain, bloating, nausea, vomiting, or persistent diarrhea. Fever of 101 degrees or higher. Swelling in your leg (s).  Reduce your risk of vascular disease  Stop smoking. If you would like help, call [facility] at 1-800-QUIT-NOW 9723183524) or [facility] at [phone]. Manage your cholesterol Maintain a desired weight Control your diabetes Keep your blood pressure down  If you have any questions please call the office at [phone].   Disposition: home with [facility] PT/OT  Patient's condition: is Good  Follow up: 1. Dr. Donzetta Matters in 2 weeks   Dagoberto Ligas, PA-C [facility] [phone] 04/01/2017  9:52 AM   - For VQI Registry use -  Post-op:  Time to Extubation: [x]  In OR, [ ]  < 12 hrs, [ ]  12-24 hrs, [ ]  >=24 hrs Vasopressors Req. Post-op: No ICU Stay: 1 day in ICU Transfusion: No  Cell savor MI: No, [ ]  Troponin only, [ ]  EKG or Clinical New Arrhythmia: No  Complications: CHF: No Resp failure: No, [ ]  Pneumonia, [ ]  Ventilator Chg in renal function: Yes, [x]  Inc. Cr > 0.5, [ ]  Temp. Dialysis, [ ]  Permanent dialysis Leg ischemia: No, no Surgery needed, [ ]  Yes, Surgery  needed, [ ]  Amputation Bowel ischemia: No, [ ]  Medical Rx, [ ]  Surgical Rx Wound complication: No, [ ]  Superficial separation/infection, [ ]  Return to OR Return to OR: No  Return to OR for bleeding: No Stroke: No, [ ]  Minor, [ ]  Major  Discharge medications: Statin use:  Yes  If No:   ASA use:  Yes  If No:   Plavix use:  No  Beta blocker use:  Yes  ACEI use:  No ARB use:  No CCB use:  Yes Coumadin use:  No

## 2017-04-04 ENCOUNTER — Telehealth: Payer: Self-pay | Admitting: Vascular Surgery

## 2017-04-04 NOTE — Telephone Encounter (Signed)
Spoke to pt and confirmed 04/22/17 appt LS

## 2017-04-04 NOTE — Telephone Encounter (Signed)
-----   Message from Mena Goes, RN sent at 04/01/2017  2:02 PM EDT ----- Regarding: 2 weeks with Dr. Donzetta Matters postop aortobifem BP   ----- Message ----- From: Iline Oven Sent: 04/01/2017   9:50 AM To: [facility] Charge Pool  Can you schedule an appt for this pt in about 2 weeks with Dr. Donzetta Matters.  PO aortobi-iliac bypass of AAA. Thanks, [facility]

## 2017-04-05 DIAGNOSIS — Z48812 Encounter for surgical aftercare following surgery on the circulatory system: Secondary | ICD-10-CM | POA: Diagnosis not present

## 2017-04-05 DIAGNOSIS — I129 Hypertensive chronic kidney disease with stage 1 through stage 4 chronic kidney disease, or unspecified chronic kidney disease: Secondary | ICD-10-CM | POA: Diagnosis not present

## 2017-04-05 DIAGNOSIS — Z7982 Long term (current) use of aspirin: Secondary | ICD-10-CM | POA: Diagnosis not present

## 2017-04-05 DIAGNOSIS — D62 Acute posthemorrhagic anemia: Secondary | ICD-10-CM | POA: Diagnosis not present

## 2017-04-05 DIAGNOSIS — Z95828 Presence of other vascular implants and grafts: Secondary | ICD-10-CM | POA: Diagnosis not present

## 2017-04-05 DIAGNOSIS — N189 Chronic kidney disease, unspecified: Secondary | ICD-10-CM | POA: Diagnosis not present

## 2017-04-05 DIAGNOSIS — N280 Ischemia and infarction of kidney: Secondary | ICD-10-CM | POA: Diagnosis not present

## 2017-04-06 DIAGNOSIS — N183 Chronic kidney disease, stage 3 (moderate): Secondary | ICD-10-CM | POA: Diagnosis not present

## 2017-04-06 DIAGNOSIS — D631 Anemia in chronic kidney disease: Secondary | ICD-10-CM | POA: Diagnosis not present

## 2017-04-19 DIAGNOSIS — I129 Hypertensive chronic kidney disease with stage 1 through stage 4 chronic kidney disease, or unspecified chronic kidney disease: Secondary | ICD-10-CM | POA: Diagnosis not present

## 2017-04-19 DIAGNOSIS — Z6829 Body mass index (BMI) 29.0-29.9, adult: Secondary | ICD-10-CM | POA: Diagnosis not present

## 2017-04-19 DIAGNOSIS — N183 Chronic kidney disease, stage 3 (moderate): Secondary | ICD-10-CM | POA: Diagnosis not present

## 2017-04-19 DIAGNOSIS — N179 Acute kidney failure, unspecified: Secondary | ICD-10-CM | POA: Diagnosis not present

## 2017-04-19 MED FILL — NYSTATIN 100,000 UNITS/ML S: 100000 | 10 days supply | Qty: 150 | Fill #0

## 2017-04-22 ENCOUNTER — Encounter: Payer: Self-pay | Admitting: Vascular Surgery

## 2017-04-22 ENCOUNTER — Ambulatory Visit (INDEPENDENT_AMBULATORY_CARE_PROVIDER_SITE_OTHER): Payer: Self-pay | Admitting: Vascular Surgery

## 2017-04-22 ENCOUNTER — Other Ambulatory Visit: Payer: Self-pay

## 2017-04-22 VITALS — BP 114/63 | HR 44 | Temp 96.7°F | Resp 18 | Ht 65.0 in | Wt 158.6 lb

## 2017-04-22 DIAGNOSIS — I714 Abdominal aortic aneurysm, without rupture, unspecified: Secondary | ICD-10-CM

## 2017-04-22 NOTE — Progress Notes (Signed)
Subjective:     Patient ID: Kristin Gross, female   DOB: 1934-09-12, 83 y.o.   MRN: [id number]  HPI 83 year old female status post aortobiiliac bypass for large aortic and iliac aneurysms.  She has chronic kidney disease and is followed by [facility].  She has done very well has no complaints today.  She has lost some weight but is getting her appetite back.  She is back to walking.  She has swelling of her left leg but otherwise no complaints.   Review of Systems Left leg swelling    Objective:   Physical Exam Awake alert and oriented Abdominal incision is clean dry and intact and her abdomen is soft Her feet are warm bilaterally she has a palpable left dorsalis pedis and a monophasic right dorsalis pedis and posterior tibial    Assessment/plan     83 year old female status post aortobiiliac bypass grafting.  She has done very well as at home has somewhat return of her appetite.  She has very strong palpable dorsalis pedis pulse on the left and some swelling in her leg.  On the right side she has very minimal monophasic signals but this is asymptomatic.  I would not pursue anything unless she became symptomatic particularly because she has chronic kidney disease and she would require angiogram probably a brachial approach given her recent aortobiiliac bypass.  We will get her to follow-up in 4-6 months with [facility].  Brandon C. Donzetta Matters, MD [facility] [place] Office: [phone] Pager: [phone]

## 2017-04-25 ENCOUNTER — Other Ambulatory Visit: Payer: Self-pay

## 2017-04-25 DIAGNOSIS — I739 Peripheral vascular disease, unspecified: Secondary | ICD-10-CM

## 2017-05-03 ENCOUNTER — Encounter (HOSPITAL_COMMUNITY): Payer: Self-pay | Admitting: Emergency Medicine

## 2017-05-03 ENCOUNTER — Emergency Department (HOSPITAL_BASED_OUTPATIENT_CLINIC_OR_DEPARTMENT_OTHER)
Admit: 2017-05-03 | Discharge: 2017-05-03 | Disposition: A | Discharge status: Home / Self Care | Payer: Medicare Other | Attending: Emergency Medicine | Admitting: Emergency Medicine

## 2017-05-03 ENCOUNTER — Telehealth: Payer: Self-pay | Admitting: *Deleted

## 2017-05-03 ENCOUNTER — Other Ambulatory Visit: Payer: Self-pay

## 2017-05-03 ENCOUNTER — Emergency Department (HOSPITAL_COMMUNITY)
Admission: EM | Admit: 2017-05-03 | Discharge: 2017-05-03 | Disposition: A | Discharge status: Home / Self Care | Payer: Medicare Other | Attending: Emergency Medicine | Admitting: Emergency Medicine

## 2017-05-03 DIAGNOSIS — F17210 Nicotine dependence, cigarettes, uncomplicated: Secondary | ICD-10-CM | POA: Insufficient documentation

## 2017-05-03 DIAGNOSIS — N289 Disorder of kidney and ureter, unspecified: Secondary | ICD-10-CM | POA: Diagnosis not present

## 2017-05-03 DIAGNOSIS — Z79899 Other long term (current) drug therapy: Secondary | ICD-10-CM | POA: Insufficient documentation

## 2017-05-03 DIAGNOSIS — I129 Hypertensive chronic kidney disease with stage 1 through stage 4 chronic kidney disease, or unspecified chronic kidney disease: Secondary | ICD-10-CM | POA: Insufficient documentation

## 2017-05-03 DIAGNOSIS — M7989 Other specified soft tissue disorders: Secondary | ICD-10-CM | POA: Diagnosis not present

## 2017-05-03 DIAGNOSIS — Z7982 Long term (current) use of aspirin: Secondary | ICD-10-CM | POA: Insufficient documentation

## 2017-05-03 DIAGNOSIS — R2242 Localized swelling, mass and lump, left lower limb: Secondary | ICD-10-CM | POA: Diagnosis present

## 2017-05-03 DIAGNOSIS — N189 Chronic kidney disease, unspecified: Secondary | ICD-10-CM | POA: Diagnosis not present

## 2017-05-03 DIAGNOSIS — L03116 Cellulitis of left lower limb: Secondary | ICD-10-CM | POA: Diagnosis not present

## 2017-05-03 DIAGNOSIS — R609 Edema, unspecified: Secondary | ICD-10-CM

## 2017-05-03 LAB — CBC WITH DIFFERENTIAL/PLATELET
BASOS ABS: 0 10*3/uL (ref 0.0–0.1)
BASOS PCT: 0 %
EOS ABS: 0.5 10*3/uL (ref 0.0–0.7)
Eosinophils Relative: 4 %
HCT: 35.6 % — ABNORMAL LOW (ref 36.0–46.0)
HEMOGLOBIN: 11.6 g/dL — AB (ref 12.0–15.0)
Lymphocytes Relative: 19 %
Lymphs Abs: 2.2 10*3/uL (ref 0.7–4.0)
MCH: 29.2 pg (ref 26.0–34.0)
MCHC: 32.6 g/dL (ref 30.0–36.0)
MCV: 89.7 fL (ref 78.0–100.0)
Monocytes Absolute: 1 10*3/uL (ref 0.1–1.0)
Monocytes Relative: 8 %
NEUTROS PCT: 69 %
Neutro Abs: 8.3 10*3/uL — ABNORMAL HIGH (ref 1.7–7.7)
Platelets: 242 10*3/uL (ref 150–400)
RBC: 3.97 MIL/uL (ref 3.87–5.11)
RDW: 14.6 % (ref 11.5–15.5)
WBC: 12 10*3/uL — AB (ref 4.0–10.5)

## 2017-05-03 LAB — BASIC METABOLIC PANEL
Anion gap: 13 (ref 5–15)
BUN: 64 mg/dL — ABNORMAL HIGH (ref 6–20)
CO2: 21 mmol/L — ABNORMAL LOW (ref 22–32)
Calcium: 9.2 mg/dL (ref 8.9–10.3)
Chloride: 102 mmol/L (ref 101–111)
Creatinine, Ser: 7.79 mg/dL — ABNORMAL HIGH (ref 0.44–1.00)
GFR calc non Af Amer: 4 mL/min — ABNORMAL LOW (ref >60)
GFR, EST AFRICAN AMERICAN: 5 mL/min — AB (ref >60)
Glucose, Bld: 114 mg/dL — ABNORMAL HIGH (ref 65–99)
POTASSIUM: 4.5 mmol/L (ref 3.5–5.1)
SODIUM: 136 mmol/L (ref 135–145)

## 2017-05-03 MED ORDER — CEPHALEXIN 250 MG PO CAPS: 250.0000 mg | ORAL_CAPSULE | Freq: Every day | ORAL | 0 refills | Status: DC

## 2017-05-03 NOTE — ED Provider Notes (Signed)
[facility] EMERGENCY DEPARTMENT Provider Note   CSN: [id number] Arrival date & time: 05/03/17  1420     History   Chief Complaint Chief Complaint  Patient presents with  . Leg Swelling    HPI Kristin Gross is a 83 y.o. female.  HPI Kristin Gross is a 83 y.o. female history abdominal aortic aneurysm repair a month and a half ago, chronic kidney disease, hypertension, thyroid disease, presents to emergency department with complaint of left leg swelling.  Patient states that her leg has been swollen since her aorto bilateral femoral artery bypass which was done on 03/24/17, however in the last 2 days he has become red and warm.  She denies any fever or chills.  She states the leg is tender however does not hurt.  She was sent here to rule out DVT.  She has no other complaints.  Past Medical History:  Diagnosis Date  . AAA (abdominal aortic aneurysm) ([place])   . Chronic kidney disease   . Hyperlipidemia   . Hypertension   . PONV (postoperative nausea and vomiting)   . Thyroid disease     Patient Active Problem List   Diagnosis Date Noted  . AAA (abdominal aortic aneurysm) ([place]) 03/24/2017  . Preoperative cardiovascular examination 03/14/2017  . Hypertensive chronic kidney disease 03/14/2017  . AAA (abdominal aortic aneurysm) without rupture ([place]) 03/14/2017    Past Surgical History:  Procedure Laterality Date  . ABDOMINAL HYSTERECTOMY    . AORTA - BILATERAL FEMORAL ARTERY BYPASS GRAFT N/A 03/24/2017   Procedure: AORTO BI ILIAC BYPASS GRAFT;  Surgeon: Waynetta Sandy, MD;  Location: [facility];  Service: Vascular;  Laterality: N/A;  . CATARACT EXTRACTION, BILATERAL    . EYE SURGERY       OB History   None      Home Medications    Prior to Admission medications   Medication Sig Start Date End Date Taking? Authorizing Provider  acetaminophen (TYLENOL) 500 MG tablet Take 500 mg by mouth daily as needed for moderate pain or headache.   Yes  [provider]  amLODipine (NORVASC) 10 MG tablet Take 1 tablet (10 mg total) by mouth daily. 04/01/17  Yes Rhyne, Hulen Shouts, PA-C  aspirin 81 MG tablet Take 81 mg by mouth daily.   Yes [provider]  Cholecalciferol (VITAMIN D-3) 1000 units CAPS Take 2,000 Units by mouth daily.    Yes [provider]  furosemide (LASIX) 40 MG tablet Take 40 mg by mouth daily.   Yes [provider]  levothyroxine (SYNTHROID, LEVOTHROID) 88 MCG tablet Take 88 mcg by mouth daily before breakfast.   Yes [provider]  metoprolol tartrate (LOPRESSOR) 50 MG tablet Take 50 mg by mouth 2 (two) times daily.   Yes [provider]  rosuvastatin (CRESTOR) 10 MG tablet Take 10 mg by mouth daily.   Yes [provider]  oxyCODONE (ROXICODONE) 5 MG immediate release tablet Take 1 tablet (5 mg total) by mouth every 8 (eight) hours as needed for severe pain. Patient not taking: Reported on 04/22/2017 03/31/17   Gabriel Earing, PA-C    Family History Family History  Problem Relation Age of Onset  . Heart disease Sister   . Heart attack Sister   . Hypertension Sister   . Cancer Mother     Social History Social History   Tobacco Use  . Smoking status: Current Every Day Smoker    Packs/day: 1.00  Years: 60.00    Pack years: 60.00    Types: Cigarettes  . Smokeless tobacco: Never Used  Substance Use Topics  . Alcohol use: No    Frequency: Never  . Drug use: No     Allergies   Lisinopril   Review of Systems Review of Systems  Constitutional: Negative for chills and fever.  Respiratory: Negative for cough, chest tightness and shortness of breath.   Cardiovascular: Positive for leg swelling. Negative for chest pain and palpitations.  Gastrointestinal: Negative for abdominal pain, diarrhea, nausea and vomiting.  Musculoskeletal: Positive for arthralgias. Negative for myalgias, neck pain and neck stiffness.  Skin: Positive for color change.  Negative for rash.  Neurological: Negative for dizziness, weakness and headaches.  All other systems reviewed and are negative.    Physical Exam Updated Vital Signs BP (!) 142/88   Pulse 66   Temp (!) 97.5 F (36.4 C) (Oral)   Resp 16   Ht 5\' 5"  (1.651 m)   Wt 70.3 kg (155 lb)   SpO2 100%   BMI 25.79 kg/m   Physical Exam  Constitutional: She appears well-developed and well-nourished. No distress.  HENT:  Head: Normocephalic.  Eyes: Conjunctivae are normal.  Neck: Neck supple.  Cardiovascular: Normal rate, regular rhythm and normal heart sounds.  Pulmonary/Chest: Effort normal and breath sounds normal. No respiratory distress. She has no wheezes. She has no rales.  Abdominal: Soft. Bowel sounds are normal. She exhibits no distension. There is no tenderness. There is no rebound.  Musculoskeletal: She exhibits no edema.  Left leg is edematous, pitting edema 3+.  It is erythematous over the shin, warm to the touch, tender.  Dorsalis pedis pulse intact and equal bilaterally.  Capillary refill less than 2 seconds distally.  Neurological: She is alert.  Skin: Skin is warm and dry.  Psychiatric: She has a normal mood and affect. Her behavior is normal.  Nursing note and vitals reviewed.    ED Treatments / Results  Labs (all labs ordered are listed, but only abnormal results are displayed) Labs Reviewed  CBC WITH DIFFERENTIAL/PLATELET - Abnormal; Notable for the following components:      Result Value   WBC 12.0 (*)    Hemoglobin 11.6 (*)    HCT 35.6 (*)    Neutro Abs 8.3 (*)    All other components within normal limits  BASIC METABOLIC PANEL - Abnormal; Notable for the following components:   CO2 21 (*)    Glucose, Bld 114 (*)    BUN 64 (*)    Creatinine, Ser 7.79 (*)    GFR calc non Af Amer 4 (*)    GFR calc Af Amer 5 (*)    All other components within normal limits    EKG None  Radiology No results found.  Procedures Procedures (including critical care  time)  Medications Ordered in ED Medications - No data to display   Initial Impression / Assessment and Plan / ED Course  I have reviewed the triage vital signs and the nursing notes.  Pertinent labs & imaging results that were available during my care of the patient were reviewed by me and considered in my medical decision making (see chart for details).     Patient in emergency department with left leg swelling.  Venous duplex performed and is negative for DVT.  Her leg is erythematous, warm to the touch, tender.  Question cellulitis versus edema from her kidney disease.  She is afebrile, nontoxic-appearing, white blood cell  count slightly elevated here.  It is also noted that her creatinine went up from 4-1/2 at baseline to now 7.7.  Patient does not want to be in the hospital.  Discussed case with Dr. Jimmy Footman with nephrology, who agreed the patient does not need to be in the hospital as long as she is not septic and does not have any electrolyte abnormalities.  Patient will call nephrology tomorrow for close outpatient follow-up regarding her renal insufficiency and acute on chronic renal failure.  I will start her on Keflex for her skin infection.  She will follow-up closely for recheck.  Return precautions discussed.  Again we offered patient admission, but she declined.  Vitals:   05/03/17 1504 05/03/17 1850 05/03/17 2100 05/03/17 2130  BP: (!) 135/56 137/74 (!) 114/93 (!) 142/88  Pulse: 61 62 71 66  Resp: 16 16 16    Temp: 98.1 F (36.7 C) (!) 97.5 F (36.4 C)    TempSrc: Oral Oral    SpO2: 100% 100% 100% 100%  Weight:      Height:         Final Clinical Impressions(s) / ED Diagnoses   Final diagnoses:  Cellulitis of left lower extremity  Left leg swelling  Renal insufficiency    ED Discharge Orders        Ordered    cephALEXin (KEFLEX) 250 MG capsule  Daily     05/03/17 2246       Jeannett Senior, PA-C 05/03/17 2247    Pattricia Boss, MD 05/05/17  [phone]

## 2017-05-03 NOTE — Telephone Encounter (Signed)
Call from patient's daughter, Patient has had swelling and now redness to LLE ankle area x 3 days. Soreness, but no acute pain. After discussing this with Zigmund Daniel instructed patient to go to [facility] ER for evaluation and treatment.

## 2017-05-03 NOTE — ED Triage Notes (Signed)
Pt states she had AAA surgery in March. Pt states her left leg has been swelling over the past few weeks. Then, over the past few days, her left lower leg started turning red and is painful. Pt concerned she has DVT.

## 2017-05-03 NOTE — Discharge Instructions (Addendum)
Keep your leg elevated while at home.  Take Keflex as prescribed, one dose a day, for possible infection.  Please call and follow-up with nephrology as soon as possible regarding your worsening kidney function. Return if worsening symptoms.

## 2017-05-03 NOTE — ED Notes (Signed)
ED Provider at bedside. 

## 2017-05-03 NOTE — ED Provider Notes (Signed)
Patient placed in Quick Look pathway, seen and evaluated   Chief Complaint: LLE swelling, redness, pain x2 days  HPI:   Pt is an 83y/o female with a PMHx of AAA s/p aorto-bilateral femoral artery bypass, CKD, HTN, and HLD, presenting today with c/o LLE swelling which has gradually worsened in the last 2 days, with associated redness, warmth, and pain with palpation.  She states that after her aorto-femoral bypass surgery on 03/24/17 she had some LLE swelling which was reported to her to be "normal" after her surgery. Her vascular surgeons were not alarmed by it, and recently saw her and "cleared her" per pt report. However over the last 2 days her leg started swelling more, and she developed warmth, redness, and pain. States it initially felt like a cramp in her leg. She denies having any insect bites or wounds to the leg. Denies recent travel or prolonged immobilization, no other surgeries in the last 4wks, not on estrogen therapies, and has no personal hx of DVT/PE however her son and daughter both had DVTs. She denies any fevers, CP, SOB, or other symptoms.   ROS: +LLE swelling, redness, warmth, and pain.  No fevers, CP, SOB.   Physical Exam:  BP (!) 135/56 (BP Location: Right Arm)   Pulse 61   Temp 98.1 F (36.7 C) (Oral)   Resp 16   Ht 5\' 5"  (1.651 m)   Wt 70.3 kg (155 lb)   SpO2 100%   BMI 25.79 kg/m    Gen: No distress  Neuro: Awake and Alert  Skin: Warm    Focused Exam: L lower leg with 2-3+ pitting edema up to the knee, with erythema over the medial aspect of the lower calf which is mildly warm to touch, no wounds or abrasions noted, no red streaking, mild TTP to the mid-calf region, no focal tenderness over the bones or joints, no crepitus or deformity. Distal pulses 2+ bilaterally, no paleness or coolness to the L leg or foot. Strength and sensation grossly intact, soft compartments.    Initiation of care has begun. The patient has been counseled on the process, plan, and necessity  for staying for the completion/evaluation, and the remainder of the medical screening examination     [address], [place], [place] 05/03/17 [place]    Carmin Muskrat, MD 05/05/17 1500

## 2017-05-03 NOTE — Progress Notes (Signed)
LLE venous duplex prelim: negative for DVT. Natlie Asfour Eunice, RDMS, RVT  

## 2017-05-05 DIAGNOSIS — N179 Acute kidney failure, unspecified: Secondary | ICD-10-CM | POA: Diagnosis not present

## 2017-05-05 DIAGNOSIS — N183 Chronic kidney disease, stage 3 (moderate): Secondary | ICD-10-CM | POA: Diagnosis not present

## 2017-05-05 DIAGNOSIS — Z9889 Other specified postprocedural states: Secondary | ICD-10-CM | POA: Diagnosis not present

## 2017-05-05 DIAGNOSIS — I129 Hypertensive chronic kidney disease with stage 1 through stage 4 chronic kidney disease, or unspecified chronic kidney disease: Secondary | ICD-10-CM | POA: Diagnosis not present

## 2017-05-13 DIAGNOSIS — N183 Chronic kidney disease, stage 3 (moderate): Secondary | ICD-10-CM | POA: Diagnosis not present

## 2017-05-20 DIAGNOSIS — I120 Hypertensive chronic kidney disease with stage 5 chronic kidney disease or end stage renal disease: Secondary | ICD-10-CM | POA: Diagnosis not present

## 2017-05-20 DIAGNOSIS — N185 Chronic kidney disease, stage 5: Secondary | ICD-10-CM | POA: Diagnosis not present

## 2017-05-20 DIAGNOSIS — N179 Acute kidney failure, unspecified: Secondary | ICD-10-CM | POA: Diagnosis not present

## 2017-05-20 DIAGNOSIS — D631 Anemia in chronic kidney disease: Secondary | ICD-10-CM | POA: Diagnosis not present

## 2017-05-23 ENCOUNTER — Other Ambulatory Visit (HOSPITAL_COMMUNITY): Payer: Self-pay | Admitting: Internal Medicine

## 2017-05-23 DIAGNOSIS — I6522 Occlusion and stenosis of left carotid artery: Secondary | ICD-10-CM

## 2017-05-24 ENCOUNTER — Ambulatory Visit (INDEPENDENT_AMBULATORY_CARE_PROVIDER_SITE_OTHER): Payer: Self-pay | Admitting: Vascular Surgery

## 2017-05-24 ENCOUNTER — Other Ambulatory Visit: Payer: Self-pay | Admitting: Vascular Surgery

## 2017-05-24 ENCOUNTER — Encounter: Payer: Self-pay | Admitting: *Deleted

## 2017-05-24 ENCOUNTER — Ambulatory Visit (HOSPITAL_COMMUNITY)
Admission: RE | Admit: 2017-05-24 | Discharge: 2017-05-24 | Disposition: A | Discharge status: Home / Self Care | Payer: Medicare Other | Source: Ambulatory Visit | Attending: Vascular Surgery | Admitting: Vascular Surgery

## 2017-05-24 ENCOUNTER — Other Ambulatory Visit: Payer: Self-pay

## 2017-05-24 ENCOUNTER — Other Ambulatory Visit: Payer: Self-pay | Admitting: *Deleted

## 2017-05-24 ENCOUNTER — Encounter: Payer: Self-pay | Admitting: Vascular Surgery

## 2017-05-24 ENCOUNTER — Encounter (HOSPITAL_COMMUNITY): Payer: Self-pay | Admitting: *Deleted

## 2017-05-24 ENCOUNTER — Ambulatory Visit (INDEPENDENT_AMBULATORY_CARE_PROVIDER_SITE_OTHER)
Admission: RE | Admit: 2017-05-24 | Discharge: 2017-05-24 | Disposition: A | Discharge status: Home / Self Care | Payer: Medicare Other | Source: Ambulatory Visit | Attending: Vascular Surgery | Admitting: Vascular Surgery

## 2017-05-24 VITALS — BP 158/74 | HR 67 | Temp 97.5°F | Resp 16 | Ht 65.0 in | Wt 159.0 lb

## 2017-05-24 DIAGNOSIS — F17200 Nicotine dependence, unspecified, uncomplicated: Secondary | ICD-10-CM | POA: Diagnosis not present

## 2017-05-24 DIAGNOSIS — N186 End stage renal disease: Secondary | ICD-10-CM | POA: Diagnosis present

## 2017-05-24 DIAGNOSIS — I120 Hypertensive chronic kidney disease with stage 5 chronic kidney disease or end stage renal disease: Secondary | ICD-10-CM

## 2017-05-24 DIAGNOSIS — Z01818 Encounter for other preprocedural examination: Secondary | ICD-10-CM | POA: Diagnosis not present

## 2017-05-24 DIAGNOSIS — Z992 Dependence on renal dialysis: Secondary | ICD-10-CM

## 2017-05-24 NOTE — Progress Notes (Signed)
   Patient name: Kristin Gross MRN: [id number] DOB: Jul 11, 1934 Sex: female  REASON FOR VISIT: Discuss access for hemodialysis  HPI: Kristin Gross is a 83 y.o. female today for discussion of access for hemodialysis.  Recent elective aneurysm repair for large infrarenal abdominal aortic aneurysm on 03/24/2017 with Dr. Donzetta Matters.  He had severe renal insufficiency prior to surgery.  She actually did quite good following the surgery but has had progressive renal insufficiency and now was not in need for dialysis.  She is requested to have a urgent replacement.  She is seen today for discussion and has surgery planned for tomorrow.  She is here with her husband and daughter.  He does not have a pacemaker and is never been on dialysis before  Current Outpatient Medications  Medication Sig Dispense Refill  . acetaminophen (TYLENOL) 500 MG tablet Take 500 mg by mouth daily as needed for moderate pain or headache.    Marland Kitchen amLODipine (NORVASC) 10 MG tablet Take 1 tablet (10 mg total) by mouth daily. 30 tablet 1  . amLODipine (NORVASC) 5 MG tablet   6  . aspirin 81 MG tablet Take 81 mg by mouth daily.    . cephALEXin (KEFLEX) 250 MG capsule Take 1 capsule (250 mg total) by mouth daily. 10 capsule 0  . Cholecalciferol (VITAMIN D-3) 1000 units CAPS Take 2,000 Units by mouth daily.     . clotrimazole (MYCELEX) 10 MG troche   0  . furosemide (LASIX) 80 MG tablet   1  . levothyroxine (SYNTHROID, LEVOTHROID) 88 MCG tablet Take 88 mcg by mouth daily before breakfast.    . metoprolol tartrate (LOPRESSOR) 50 MG tablet Take 50 mg by mouth 2 (two) times daily.    Marland Kitchen oxyCODONE (ROXICODONE) 5 MG immediate release tablet Take 1 tablet (5 mg total) by mouth every 8 (eight) hours as needed for severe pain. 20 tablet 0  . rosuvastatin (CRESTOR) 10 MG tablet Take 10 mg by mouth daily.    . furosemide (LASIX) 40 MG tablet Take 40 mg by mouth daily.     No current facility-administered medications  for this visit.      PHYSICAL EXAM: Vitals:   05/24/17 0859  BP: (!) 158/74  Pulse: 67  Resp: 16  Temp: (!) 97.5 F (36.4 C)  TempSrc: Oral  SpO2: 99%  Weight: 159 lb (72.1 kg)  Height: 5\' 5"  (1.651 m)    GENERAL: The patient is a well-nourished female, in no acute distress. The vital signs are documented above. 2-3+ radial pulses bilaterally.  Small surface veins bilaterally  MEDICAL ISSUES: Uterine arterial venous upper extremity studies.  This showed small to moderate cephalic vein and basilic veins bilaterally.  I have recommended tunneled hemodialysis catheter and left arm access.  Discussed both AV fistula and AV graft placement.  We will proceed with this as an outpatient tomorrow   Rosetta Posner, MD [facility] Tel [phone] Pager [phone]

## 2017-05-24 NOTE — Progress Notes (Signed)
Pt SDW-pre-op call completed by pt daughter Lattie Haw, ([place]). Daughter denies any acute cardiopulmonary issues. Daughter stated that pt under the care of Dr. Bettina Gavia, Cardiology. Daughter denies that pt had a cardiac cath. Daughter made aware to have pt stop taking vitamins, fish oil, herbal medications. Do not take any NSAIDs ie: Ibuprofen, Advil, Naproxen (Aleve), Motrin, BC and Goody Powder. Daughter verbalized understanding of all pre-op instructions.

## 2017-05-25 ENCOUNTER — Ambulatory Visit (HOSPITAL_COMMUNITY)
Admission: RE | Admit: 2017-05-25 | Discharge: 2017-05-25 | Disposition: A | Discharge status: Home / Self Care | Payer: Medicare Other | Source: Ambulatory Visit | Attending: Vascular Surgery | Admitting: Vascular Surgery

## 2017-05-25 ENCOUNTER — Ambulatory Visit (HOSPITAL_COMMUNITY): Payer: Medicare Other | Admitting: Anesthesiology

## 2017-05-25 ENCOUNTER — Ambulatory Visit (HOSPITAL_COMMUNITY): Payer: Medicare Other

## 2017-05-25 ENCOUNTER — Telehealth: Payer: Self-pay | Admitting: Vascular Surgery

## 2017-05-25 ENCOUNTER — Encounter (HOSPITAL_COMMUNITY): Payer: Self-pay | Admitting: *Deleted

## 2017-05-25 ENCOUNTER — Other Ambulatory Visit: Payer: Self-pay

## 2017-05-25 ENCOUNTER — Encounter (HOSPITAL_COMMUNITY)
Admission: RE | Disposition: A | Discharge status: Home / Self Care | Payer: Self-pay | Source: Ambulatory Visit | Attending: Vascular Surgery

## 2017-05-25 DIAGNOSIS — N186 End stage renal disease: Secondary | ICD-10-CM | POA: Diagnosis not present

## 2017-05-25 DIAGNOSIS — I739 Peripheral vascular disease, unspecified: Secondary | ICD-10-CM | POA: Insufficient documentation

## 2017-05-25 DIAGNOSIS — Z79890 Hormone replacement therapy (postmenopausal): Secondary | ICD-10-CM | POA: Diagnosis not present

## 2017-05-25 DIAGNOSIS — J811 Chronic pulmonary edema: Secondary | ICD-10-CM | POA: Diagnosis not present

## 2017-05-25 DIAGNOSIS — Z79899 Other long term (current) drug therapy: Secondary | ICD-10-CM | POA: Diagnosis not present

## 2017-05-25 DIAGNOSIS — I714 Abdominal aortic aneurysm, without rupture: Secondary | ICD-10-CM | POA: Diagnosis not present

## 2017-05-25 DIAGNOSIS — Z79891 Long term (current) use of opiate analgesic: Secondary | ICD-10-CM | POA: Insufficient documentation

## 2017-05-25 DIAGNOSIS — I120 Hypertensive chronic kidney disease with stage 5 chronic kidney disease or end stage renal disease: Secondary | ICD-10-CM | POA: Insufficient documentation

## 2017-05-25 DIAGNOSIS — Z992 Dependence on renal dialysis: Secondary | ICD-10-CM

## 2017-05-25 DIAGNOSIS — Z7982 Long term (current) use of aspirin: Secondary | ICD-10-CM | POA: Diagnosis not present

## 2017-05-25 DIAGNOSIS — N185 Chronic kidney disease, stage 5: Secondary | ICD-10-CM | POA: Diagnosis not present

## 2017-05-25 DIAGNOSIS — F17200 Nicotine dependence, unspecified, uncomplicated: Secondary | ICD-10-CM | POA: Diagnosis not present

## 2017-05-25 HISTORY — PX: INSERTION OF DIALYSIS CATHETER: SHX1324

## 2017-05-25 HISTORY — PX: AV FISTULA PLACEMENT: SHX1204

## 2017-05-25 LAB — POCT I-STAT 4, (NA,K, GLUC, HGB,HCT)
Glucose, Bld: 93 mg/dL (ref 65–99)
HCT: 32 % — ABNORMAL LOW (ref 36.0–46.0)
HEMOGLOBIN: 10.9 g/dL — AB (ref 12.0–15.0)
Potassium: 4 mmol/L (ref 3.5–5.1)
Sodium: 138 mmol/L (ref 135–145)

## 2017-05-25 SURGERY — INSERTION OF DIALYSIS CATHETER
Anesthesia: Monitor Anesthesia Care | Site: Chest | Laterality: Right

## 2017-05-25 MED ORDER — CHLORHEXIDINE GLUCONATE 4 % EX LIQD: 60.0000 mL | Freq: Once | CUTANEOUS | Status: DC

## 2017-05-25 MED ORDER — PHENYLEPHRINE HCL 10 MG/ML IJ SOLN
INTRAVENOUS | Status: DC | PRN
  Administered 2017-05-25: 100 ug/min via INTRAVENOUS

## 2017-05-25 MED ORDER — SODIUM CHLORIDE 0.9 % IV SOLN
INTRAVENOUS | Status: DC
  Administered 2017-05-25 (×2): via INTRAVENOUS

## 2017-05-25 MED ORDER — FENTANYL CITRATE (PF) 250 MCG/5ML IJ SOLN
INTRAMUSCULAR | Status: AC
  Filled 2017-05-25: qty 5

## 2017-05-25 MED ORDER — HEPARIN SODIUM (PORCINE) 1000 UNIT/ML IJ SOLN
INTRAMUSCULAR | Status: AC
  Filled 2017-05-25: qty 1

## 2017-05-25 MED ORDER — CEFAZOLIN SODIUM-DEXTROSE 2-4 GM/100ML-% IV SOLN
INTRAVENOUS | Status: AC
  Filled 2017-05-25: qty 100

## 2017-05-25 MED ORDER — FENTANYL CITRATE (PF) 100 MCG/2ML IJ SOLN
INTRAMUSCULAR | Status: DC | PRN
  Administered 2017-05-25 (×2): 25 ug via INTRAVENOUS
  Administered 2017-05-25 (×2): 50 ug via INTRAVENOUS

## 2017-05-25 MED ORDER — FENTANYL CITRATE (PF) 100 MCG/2ML IJ SOLN: 25.0000 ug | INTRAMUSCULAR | Status: DC | PRN

## 2017-05-25 MED ORDER — 0.9 % SODIUM CHLORIDE (POUR BTL) OPTIME
TOPICAL | Status: DC | PRN
  Administered 2017-05-25: 1000 mL

## 2017-05-25 MED ORDER — PROMETHAZINE HCL 25 MG/ML IJ SOLN: 6.2500 mg | INTRAMUSCULAR | Status: DC | PRN

## 2017-05-25 MED ORDER — HEPARIN SODIUM (PORCINE) 1000 UNIT/ML IJ SOLN
INTRAMUSCULAR | Status: DC | PRN
  Administered 2017-05-25: 1000 [IU]

## 2017-05-25 MED ORDER — OXYCODONE-ACETAMINOPHEN 5-325 MG PO TABS: 1.0000 | ORAL_TABLET | Freq: Four times a day (QID) | ORAL | 0 refills | Status: DC | PRN

## 2017-05-25 MED ORDER — LIDOCAINE HCL (PF) 1 % IJ SOLN
INTRAMUSCULAR | Status: AC
  Filled 2017-05-25: qty 30

## 2017-05-25 MED ORDER — SODIUM CHLORIDE 0.9 % IV SOLN
INTRAVENOUS | Status: AC
  Filled 2017-05-25: qty 1.2

## 2017-05-25 MED ORDER — SODIUM CHLORIDE 0.9 % IV SOLN
INTRAVENOUS | Status: DC | PRN
  Administered 2017-05-25: 11:00:00

## 2017-05-25 MED ORDER — CEFAZOLIN SODIUM-DEXTROSE 2-4 GM/100ML-% IV SOLN
2.0000 g | INTRAVENOUS | Status: AC
  Administered 2017-05-25: 2 g via INTRAVENOUS

## 2017-05-25 MED ORDER — LIDOCAINE HCL 1 % IJ SOLN
INTRAMUSCULAR | Status: DC | PRN
  Administered 2017-05-25: 30 mL via RESPIRATORY_TRACT

## 2017-05-25 MED ORDER — PHENYLEPHRINE 40 MCG/ML (10ML) SYRINGE FOR IV PUSH (FOR BLOOD PRESSURE SUPPORT)
PREFILLED_SYRINGE | INTRAVENOUS | Status: DC | PRN
  Administered 2017-05-25 (×4): 80 ug via INTRAVENOUS

## 2017-05-25 MED ORDER — LIDOCAINE HCL (CARDIAC) PF 100 MG/5ML IV SOSY
PREFILLED_SYRINGE | INTRAVENOUS | Status: DC | PRN
  Administered 2017-05-25 (×2): 50 mg via INTRAVENOUS

## 2017-05-25 MED ORDER — EPHEDRINE SULFATE 50 MG/ML IJ SOLN
INTRAMUSCULAR | Status: DC | PRN
  Administered 2017-05-25 (×5): 10 mg via INTRAVENOUS

## 2017-05-25 MED ORDER — PROPOFOL 500 MG/50ML IV EMUL
INTRAVENOUS | Status: DC | PRN
  Administered 2017-05-25: 25 ug/kg/min via INTRAVENOUS

## 2017-05-25 MED ORDER — PROPOFOL 10 MG/ML IV BOLUS
INTRAVENOUS | Status: AC
  Filled 2017-05-25: qty 20

## 2017-05-25 SURGICAL SUPPLY — 48 items
ARMBAND PINK RESTRICT EXTREMIT (MISCELLANEOUS) ×6 IMPLANT
BAG DECANTER FOR FLEXI CONT (MISCELLANEOUS) ×3 IMPLANT
BIOPATCH RED 1 DISK 7.0 (GAUZE/BANDAGES/DRESSINGS) ×3 IMPLANT
CANISTER SUCT 3000ML PPV (MISCELLANEOUS) ×3 IMPLANT
CANNULA VESSEL 3MM 2 BLNT TIP (CANNULA) IMPLANT
CATH PALINDROME RT-P 15FX19CM (CATHETERS) ×3 IMPLANT
CLIP LIGATING EXTRA MED SLVR (CLIP) IMPLANT
CLIP LIGATING EXTRA SM BLUE (MISCELLANEOUS) IMPLANT
CLIP TI WIDE RED SMALL 6 (CLIP) ×3 IMPLANT
CLIP VESOCCLUDE MED 6/CT (CLIP) ×3 IMPLANT
COVER DOME SNAP 22 D (MISCELLANEOUS) ×3 IMPLANT
COVER PROBE W GEL 5X96 (DRAPES) ×6 IMPLANT
COVER SURGICAL LIGHT HANDLE (MISCELLANEOUS) ×3 IMPLANT
DECANTER SPIKE VIAL GLASS SM (MISCELLANEOUS) ×3 IMPLANT
DERMABOND ADVANCED (GAUZE/BANDAGES/DRESSINGS) ×2
DERMABOND ADVANCED .7 DNX12 (GAUZE/BANDAGES/DRESSINGS) ×4 IMPLANT
DRAPE C-ARM 42X72 X-RAY (DRAPES) IMPLANT
DRAPE CHEST BREAST 15X10 FENES (DRAPES) ×3 IMPLANT
ELECT REM PT RETURN 9FT ADLT (ELECTROSURGICAL) ×3
ELECTRODE REM PT RTRN 9FT ADLT (ELECTROSURGICAL) ×2 IMPLANT
GLOVE SS BIOGEL STRL SZ 7.5 (GLOVE) ×2 IMPLANT
GLOVE SUPERSENSE BIOGEL SZ 7.5 (GLOVE) ×1
GOWN STRL REUS W/ TWL LRG LVL3 (GOWN DISPOSABLE) ×6 IMPLANT
GOWN STRL REUS W/TWL LRG LVL3 (GOWN DISPOSABLE) ×3
KIT BASIN OR (CUSTOM PROCEDURE TRAY) ×3 IMPLANT
KIT TURNOVER KIT B (KITS) ×3 IMPLANT
NEEDLE 18GX1X1/2 (RX/OR ONLY) (NEEDLE) ×3 IMPLANT
NEEDLE 22X1 1/2 (OR ONLY) (NEEDLE) IMPLANT
NEEDLE HYPO 25GX1X1/2 BEV (NEEDLE) ×3 IMPLANT
NS IRRIG 1000ML POUR BTL (IV SOLUTION) ×3 IMPLANT
PACK CV ACCESS (CUSTOM PROCEDURE TRAY) ×3 IMPLANT
PACK SURGICAL SETUP 50X90 (CUSTOM PROCEDURE TRAY) ×3 IMPLANT
PAD ARMBOARD 7.5X6 YLW CONV (MISCELLANEOUS) ×6 IMPLANT
SOAP 2 % CHG 4 OZ (WOUND CARE) ×3 IMPLANT
SUT ETHILON 3 0 PS 1 (SUTURE) ×3 IMPLANT
SUT MNCRL AB 4-0 PS2 18 (SUTURE) ×6 IMPLANT
SUT PROLENE 6 0 CC (SUTURE) ×9 IMPLANT
SUT VIC AB 3-0 SH 27 (SUTURE) ×1
SUT VIC AB 3-0 SH 27X BRD (SUTURE) ×2 IMPLANT
SUT VICRYL 4-0 PS2 18IN ABS (SUTURE) IMPLANT
SYR 10ML LL (SYRINGE) ×3 IMPLANT
SYR 20CC LL (SYRINGE) ×3 IMPLANT
SYR 5ML LL (SYRINGE) ×6 IMPLANT
SYR CONTROL 10ML LL (SYRINGE) ×3 IMPLANT
TOWEL GREEN STERILE (TOWEL DISPOSABLE) ×6 IMPLANT
TOWEL GREEN STERILE FF (TOWEL DISPOSABLE) ×3 IMPLANT
UNDERPAD 30X30 (UNDERPADS AND DIAPERS) ×3 IMPLANT
WATER STERILE IRR 1000ML POUR (IV SOLUTION) ×3 IMPLANT

## 2017-05-25 NOTE — Transfer of Care (Signed)
Immediate Anesthesia Transfer of Care Note  Patient: Kristin Gross  Procedure(s) Performed: INSERTION OF TUNNELED  DIALYSIS CATHETER RIGHT INTERNAL JUGULAR (Right Chest) ARTERIOVENOUS (AV) FISTULA CREATION LEFT ARM (Left Arm Upper)  Patient Location: PACU  Anesthesia Type:MAC  Level of Consciousness: awake and alert   Airway & Oxygen Therapy: Patient Spontanous Breathing and Patient connected to face mask oxygen  Post-op Assessment: Report given to RN and Post -op Vital signs reviewed and stable  Post vital signs: Reviewed and stable  Last Vitals:  Vitals Value Taken Time  BP    Temp    Pulse 69 05/25/2017  1:03 PM  Resp 17 05/25/2017  1:03 PM  SpO2 88 % 05/25/2017  1:03 PM  Vitals shown include unvalidated device data.  Last Pain:  Vitals:   05/25/17 0927  TempSrc:   PainSc: 0-No pain      Patients Stated Pain Goal: 2 (17/49/44 9675)  Complications: No apparent anesthesia complications

## 2017-05-25 NOTE — Anesthesia Procedure Notes (Signed)
Procedure Name: MAC Date/Time: 05/25/2017 11:37 AM Performed by: Lieutenant Diego, CRNA Pre-anesthesia Checklist: Patient identified, Emergency Drugs available, Suction available, Patient being monitored and Timeout performed Patient Re-evaluated:Patient Re-evaluated prior to induction Oxygen Delivery Method: Simple face mask Preoxygenation: Pre-oxygenation with 100% oxygen Induction Type: IV induction

## 2017-05-25 NOTE — Anesthesia Postprocedure Evaluation (Signed)
Anesthesia Post Note  Patient: Kristin Gross  Procedure(s) Performed: INSERTION OF TUNNELED  DIALYSIS CATHETER RIGHT INTERNAL JUGULAR (Right Chest) ARTERIOVENOUS (AV) FISTULA CREATION LEFT ARM (Left Arm Upper)     Patient location during evaluation: PACU Anesthesia Type: MAC Level of consciousness: awake and alert Pain management: pain level controlled Vital Signs Assessment: post-procedure vital signs reviewed and stable Respiratory status: spontaneous breathing, nonlabored ventilation, respiratory function stable and patient connected to nasal cannula oxygen Cardiovascular status: stable and blood pressure returned to baseline Postop Assessment: no apparent nausea or vomiting Anesthetic complications: no    Last Vitals:  Vitals:   05/25/17 1330 05/25/17 1345  BP: 106/77 103/76  Pulse: 68 64  Resp: 20 17  Temp:    SpO2: (!) 89% 91%    Last Pain:  Vitals:   05/25/17 1330  TempSrc:   PainSc: 0-No pain                 Sahar Ryback S

## 2017-05-25 NOTE — Telephone Encounter (Signed)
sch appt 07/08/17 2pm Dialysis Duplex + 3pm  f/u called pt confirmed

## 2017-05-25 NOTE — Discharge Instructions (Signed)
° °  [facility] [place] ° °Discharge Instructions ° °AV Fistula or Graft Surgery for Dialysis Access ° °Please refer to the following instructions for your post-procedure care. Your surgeon or physician assistant will discuss any changes with you. ° °Activity ° °You may drive the day following your surgery, if you are comfortable and no longer taking prescription pain medication. Resume full activity as the soreness in your incision resolves. ° °Bathing/Showering ° °You may shower after you go home. Keep your incision dry for 48 hours. Do not soak in a bathtub, hot tub, or swim until the incision heals completely. You may not shower if you have a hemodialysis catheter. ° °Incision Care ° °Clean your incision with mild soap and water after 48 hours. Pat the area dry with a clean towel. You do not need a bandage unless otherwise instructed. Do not apply any ointments or creams to your incision. You may have skin glue on your incision. Do not peel it off. It will come off on its own in about one week. Your arm may swell a bit after surgery. To reduce swelling use pillows to elevate your arm so it is above your heart. Your doctor will tell you if you need to lightly wrap your arm with an ACE bandage. ° °Diet ° °Resume your normal diet. There are not special food restrictions following this procedure. In order to heal from your surgery, it is CRITICAL to get adequate nutrition. Your body requires vitamins, minerals, and protein. Vegetables are the best source of vitamins and minerals. Vegetables also provide the perfect balance of protein. Processed food has little nutritional value, so try to avoid this. ° °Medications ° °Resume taking all of your medications. If your incision is causing pain, you may take over-the counter pain relievers such as acetaminophen (Tylenol). If you were prescribed a stronger pain medication, please be aware these medications can cause nausea and constipation. Prevent  nausea by taking the medication with a snack or meal. Avoid constipation by drinking plenty of fluids and eating foods with high amount of fiber, such as fruits, vegetables, and grains. Do not take Tylenol if you are taking prescription pain medications. ° ° ° ° °Follow up °Your surgeon may want to see you in the office following your access surgery. If so, this will be arranged at the time of your surgery. ° °Please call us immediately for any of the following conditions: ° °Increased pain, redness, drainage (pus) from your incision site °Fever of 101 degrees or higher °Severe or worsening pain at your incision site °Hand pain or numbness. ° °Reduce your risk of vascular disease: ° °Stop smoking. If you would like help, call [facility] at 1-800-QUIT-NOW (1-800-784-8669) or [facility] at [phone] ° °Manage your cholesterol °Maintain a desired weight °Control your diabetes °Keep your blood pressure down ° °Dialysis ° °It will take several weeks to several months for your new dialysis access to be ready for use. Your surgeon will determine when it is OK to use it. Your nephrologist will continue to direct your dialysis. You can continue to use your Permcath until your new access is ready for use. ° °If you have any questions, please call the office at [phone]. ° °

## 2017-05-25 NOTE — H&P (Signed)
   History and Physical Update  The patient was interviewed and re-examined.  The patient's previous History and Physical has been reviewed and is unchanged from office visit yesterday.  Plan will be for tunneled dialysis catheter and fistula versus graft.  We discussed the risk and benefits and she agrees to proceed.  Leidy Massar C. Donzetta Matters, MD [facility] [place] Office: [phone] Pager: [phone]   05/25/2017, 10:42 AM

## 2017-05-25 NOTE — Telephone Encounter (Signed)
-----   Message from Mena Goes, RN sent at 05/25/2017  1:20 PM EDT ----- Regarding: 5-6 weeks with duplex   ----- Message ----- From: Ulyses Amor, PA-C Sent: 05/25/2017  12:43 PM To: Vvs Charge Pool  F/U with Dr. Donzetta Matters or [facility] in 5-6 weeks s/p fistula creation needs duplex

## 2017-05-25 NOTE — Anesthesia Preprocedure Evaluation (Signed)
Anesthesia Evaluation  Patient identified by MRN, date of birth, ID band Patient awake    Reviewed: Allergy & Precautions, NPO status , Patient's Chart, lab work & pertinent test results  Airway Mallampati: II  TM Distance: >3 FB Neck ROM: Full    Dental no notable dental hx.    Pulmonary Current Smoker,    Pulmonary exam normal breath sounds clear to auscultation       Cardiovascular hypertension, Pt. on medications + Peripheral Vascular Disease  Normal cardiovascular exam Rhythm:Regular Rate:Normal     Neuro/Psych negative neurological ROS  negative psych ROS   GI/Hepatic negative GI ROS, Neg liver ROS,   Endo/Other  negative endocrine ROS  Renal/GU ESRFRenal disease  negative genitourinary   Musculoskeletal negative musculoskeletal ROS (+)   Abdominal   Peds negative pediatric ROS (+)  Hematology negative hematology ROS (+)   Anesthesia Other Findings   Reproductive/Obstetrics negative OB ROS                             Anesthesia Physical Anesthesia Plan  ASA: III  Anesthesia Plan: MAC   Post-op Pain Management:    Induction: Intravenous  PONV Risk Score and Plan: 1 and Ondansetron  Airway Management Planned: Simple Face Mask  Additional Equipment:   Intra-op Plan:   Post-operative Plan:   Informed Consent: I have reviewed the patients History and Physical, chart, labs and discussed the procedure including the risks, benefits and alternatives for the proposed anesthesia with the patient or authorized representative who has indicated his/her understanding and acceptance.   Dental advisory given  Plan Discussed with: CRNA and Surgeon  Anesthesia Plan Comments:         Anesthesia Quick Evaluation

## 2017-05-25 NOTE — Op Note (Signed)
Patient name: Kristin Gross MRN: [id number] DOB: 1934/03/21 Sex: female  05/25/2017 Pre-operative Diagnosis: End-stage renal disease Post-operative diagnosis:  Same Surgeon:  Erlene Quan C. Donzetta Matters, MD Assistant: Laurence Slate, PA Procedure Performed: 1.  Right IJ tunneled dialysis catheter with ultrasound guidance 2.  Left brachial basilic AV fistula creation  Indications: 83 year old female well-known to me after repair of a juxtarenal abdominal aortic aneurysm.  At that time she had CKD 4 and is now progressed to end-stage renal disease.  She is now indicated for tunnel catheter as well as for stage fistula creation.  Findings: The IJ was patent was easily cannulated with ultrasound guidance and a 19 cm tunneled catheter was placed.  The ultrasound of the left upper extremity demonstrated marginal cephalic and basilic vein for fistula creation.  On open expiration the cephalic vein was very small at the antecubitum with basilic vein was sizable.  I completion there was a thrill proximally in the fistula.  She did not appear to have significant flow to the hand but postoperatively was asymptomatic despite this.   Procedure:  The patient was identified in the holding area and taken to timeout was called antibiotics were administered.  the operating room where she was placed supine on the operating table MAC anesthesia was induced and she was sterilely prepped and draped in the neck and chest in the usual fashion.  We began with ultrasound-guided evaluation of the right IJ which was noted to be patent.  1% lidocaine with epinephrine was then injected over the neck and chest area.  The IJ was cannulated with an 18-gauge needle directly with ultrasound evaluation.  A wire was passed into the IVC under fluoroscopic guidance.  A counterincision was made and a 19 cm catheter was tunneled and trimmed to size.  The wire tract was serially dilated and introducer sheath was placed under fluoroscopic guidance and  the 19 cm catheter was then tunneled terminating in the SVC.  It was then assembled flushed with heparinized saline lock with 1.5 cc of concentrated heparin.  It was affixed to the skin with 3-0 nylon suture and the neck incision was closed with 4-0 Monocryl.  Dermabond was placed at both levels and a dry dressing was placed.  Patient was then re-sterilely prepped and draped in her left upper extremity and timeout again called.  We began with ultrasound evaluation of the upper extremity veins which demonstrated sizable vein both the cephalic and basilic and so transverse incision was made at the level of the antecubitum.  Cephalic vein on open expiration appeared to be small and was very thin.  Basilic vein was quite a bit more robust.  With this we marked this for orientation clamped and divided it from the cephalic vein itself.  We then divided the deep fascia to expose her brachial artery and placed a vessel loop around this.  The vein was then spatulated and dilated to 4 mm and flushed with heparinized saline.  The artery was then clamped distally and proximally and opened longitudinally and flushed with heparinized saline both directions.  The vein was then sewn end-to-side with 6-0 Prolene suture.  On releasing the clamps we did have significant bleeding and upon re-clamping noted a tear in the vein.  We then took down our anastomosis re-advised our spatulation of the vein and had a trace it up and divided branch to get extra length.  We flushed again with heparinized saline.  We then sewed our vein again and the  side with 6-0 Prolene.  Prior to completing this anastomosis we allowed flushing in all directions and we then had a thrill going through the vein and there is a palpable radial pulse at the wrist.  With this we obtained hemostasis and the wound closed in 2 layers with Vicryl Monocryl.  Dermabond was placed to the level of the skin.  Upon evaluation postoperatively the patient had pulsatility in  the runoff fistula there is no palpable pulse left in the wrist and minimal signal.  Patient's hand is sensorimotor intact.  Patient and family have been notified that should she have sensory or motor deficits in her hand she will likely need this fistula  ligated and I am unsure that I would proceed with a graft at that time.  She did tolerate this procedure well and all counts were correct at completion.  EBL 50 cc.  Kristin Gross C. Donzetta Matters, MD [facility] [place] Office: [phone] Pager: [phone]

## 2017-05-26 ENCOUNTER — Other Ambulatory Visit: Payer: Self-pay

## 2017-05-26 ENCOUNTER — Encounter (HOSPITAL_COMMUNITY): Payer: Self-pay | Admitting: Vascular Surgery

## 2017-05-26 DIAGNOSIS — Z992 Dependence on renal dialysis: Principal | ICD-10-CM

## 2017-05-26 DIAGNOSIS — Z48812 Encounter for surgical aftercare following surgery on the circulatory system: Secondary | ICD-10-CM

## 2017-05-26 DIAGNOSIS — N186 End stage renal disease: Secondary | ICD-10-CM

## 2017-05-27 DIAGNOSIS — N2581 Secondary hyperparathyroidism of renal origin: Secondary | ICD-10-CM | POA: Diagnosis not present

## 2017-05-27 DIAGNOSIS — Z111 Encounter for screening for respiratory tuberculosis: Secondary | ICD-10-CM | POA: Diagnosis not present

## 2017-05-27 DIAGNOSIS — D689 Coagulation defect, unspecified: Secondary | ICD-10-CM | POA: Diagnosis not present

## 2017-05-27 DIAGNOSIS — N186 End stage renal disease: Secondary | ICD-10-CM | POA: Diagnosis not present

## 2017-05-27 DIAGNOSIS — D631 Anemia in chronic kidney disease: Secondary | ICD-10-CM | POA: Diagnosis not present

## 2017-05-27 DIAGNOSIS — T8249XA Other complication of vascular dialysis catheter, initial encounter: Secondary | ICD-10-CM | POA: Diagnosis not present

## 2017-05-30 DIAGNOSIS — Z111 Encounter for screening for respiratory tuberculosis: Secondary | ICD-10-CM | POA: Diagnosis not present

## 2017-05-30 DIAGNOSIS — D631 Anemia in chronic kidney disease: Secondary | ICD-10-CM | POA: Diagnosis not present

## 2017-05-30 DIAGNOSIS — N186 End stage renal disease: Secondary | ICD-10-CM | POA: Diagnosis not present

## 2017-05-30 DIAGNOSIS — T8249XA Other complication of vascular dialysis catheter, initial encounter: Secondary | ICD-10-CM | POA: Diagnosis not present

## 2017-05-30 DIAGNOSIS — D689 Coagulation defect, unspecified: Secondary | ICD-10-CM | POA: Diagnosis not present

## 2017-05-30 DIAGNOSIS — N2581 Secondary hyperparathyroidism of renal origin: Secondary | ICD-10-CM | POA: Diagnosis not present

## 2017-06-01 DIAGNOSIS — D631 Anemia in chronic kidney disease: Secondary | ICD-10-CM | POA: Diagnosis not present

## 2017-06-01 DIAGNOSIS — N186 End stage renal disease: Secondary | ICD-10-CM | POA: Diagnosis not present

## 2017-06-01 DIAGNOSIS — Z111 Encounter for screening for respiratory tuberculosis: Secondary | ICD-10-CM | POA: Diagnosis not present

## 2017-06-01 DIAGNOSIS — T8249XA Other complication of vascular dialysis catheter, initial encounter: Secondary | ICD-10-CM | POA: Diagnosis not present

## 2017-06-01 DIAGNOSIS — D689 Coagulation defect, unspecified: Secondary | ICD-10-CM | POA: Diagnosis not present

## 2017-06-01 DIAGNOSIS — N2581 Secondary hyperparathyroidism of renal origin: Secondary | ICD-10-CM | POA: Diagnosis not present

## 2017-06-03 DIAGNOSIS — D631 Anemia in chronic kidney disease: Secondary | ICD-10-CM | POA: Diagnosis not present

## 2017-06-03 DIAGNOSIS — N186 End stage renal disease: Secondary | ICD-10-CM | POA: Diagnosis not present

## 2017-06-03 DIAGNOSIS — N2581 Secondary hyperparathyroidism of renal origin: Secondary | ICD-10-CM | POA: Diagnosis not present

## 2017-06-03 DIAGNOSIS — T8249XA Other complication of vascular dialysis catheter, initial encounter: Secondary | ICD-10-CM | POA: Diagnosis not present

## 2017-06-03 DIAGNOSIS — Z111 Encounter for screening for respiratory tuberculosis: Secondary | ICD-10-CM | POA: Diagnosis not present

## 2017-06-03 DIAGNOSIS — D689 Coagulation defect, unspecified: Secondary | ICD-10-CM | POA: Diagnosis not present

## 2017-06-06 DIAGNOSIS — D631 Anemia in chronic kidney disease: Secondary | ICD-10-CM | POA: Diagnosis not present

## 2017-06-06 DIAGNOSIS — N186 End stage renal disease: Secondary | ICD-10-CM | POA: Diagnosis not present

## 2017-06-06 DIAGNOSIS — N2581 Secondary hyperparathyroidism of renal origin: Secondary | ICD-10-CM | POA: Diagnosis not present

## 2017-06-06 DIAGNOSIS — T8249XA Other complication of vascular dialysis catheter, initial encounter: Secondary | ICD-10-CM | POA: Diagnosis not present

## 2017-06-06 DIAGNOSIS — Z111 Encounter for screening for respiratory tuberculosis: Secondary | ICD-10-CM | POA: Diagnosis not present

## 2017-06-06 DIAGNOSIS — D689 Coagulation defect, unspecified: Secondary | ICD-10-CM | POA: Diagnosis not present

## 2017-06-08 DIAGNOSIS — N186 End stage renal disease: Secondary | ICD-10-CM | POA: Diagnosis not present

## 2017-06-08 DIAGNOSIS — Z111 Encounter for screening for respiratory tuberculosis: Secondary | ICD-10-CM | POA: Diagnosis not present

## 2017-06-08 DIAGNOSIS — D689 Coagulation defect, unspecified: Secondary | ICD-10-CM | POA: Diagnosis not present

## 2017-06-08 DIAGNOSIS — D631 Anemia in chronic kidney disease: Secondary | ICD-10-CM | POA: Diagnosis not present

## 2017-06-08 DIAGNOSIS — T8249XA Other complication of vascular dialysis catheter, initial encounter: Secondary | ICD-10-CM | POA: Diagnosis not present

## 2017-06-08 DIAGNOSIS — N2581 Secondary hyperparathyroidism of renal origin: Secondary | ICD-10-CM | POA: Diagnosis not present

## 2017-06-10 DIAGNOSIS — N186 End stage renal disease: Secondary | ICD-10-CM | POA: Diagnosis not present

## 2017-06-10 DIAGNOSIS — Z111 Encounter for screening for respiratory tuberculosis: Secondary | ICD-10-CM | POA: Diagnosis not present

## 2017-06-10 DIAGNOSIS — T8249XA Other complication of vascular dialysis catheter, initial encounter: Secondary | ICD-10-CM | POA: Diagnosis not present

## 2017-06-10 DIAGNOSIS — D631 Anemia in chronic kidney disease: Secondary | ICD-10-CM | POA: Diagnosis not present

## 2017-06-10 DIAGNOSIS — D689 Coagulation defect, unspecified: Secondary | ICD-10-CM | POA: Diagnosis not present

## 2017-06-10 DIAGNOSIS — N2581 Secondary hyperparathyroidism of renal origin: Secondary | ICD-10-CM | POA: Diagnosis not present

## 2017-06-13 DIAGNOSIS — D631 Anemia in chronic kidney disease: Secondary | ICD-10-CM | POA: Diagnosis not present

## 2017-06-13 DIAGNOSIS — Z111 Encounter for screening for respiratory tuberculosis: Secondary | ICD-10-CM | POA: Diagnosis not present

## 2017-06-13 DIAGNOSIS — N186 End stage renal disease: Secondary | ICD-10-CM | POA: Diagnosis not present

## 2017-06-13 DIAGNOSIS — T8249XA Other complication of vascular dialysis catheter, initial encounter: Secondary | ICD-10-CM | POA: Diagnosis not present

## 2017-06-13 DIAGNOSIS — N2581 Secondary hyperparathyroidism of renal origin: Secondary | ICD-10-CM | POA: Diagnosis not present

## 2017-06-13 DIAGNOSIS — D689 Coagulation defect, unspecified: Secondary | ICD-10-CM | POA: Diagnosis not present

## 2017-06-15 DIAGNOSIS — D631 Anemia in chronic kidney disease: Secondary | ICD-10-CM | POA: Diagnosis not present

## 2017-06-15 DIAGNOSIS — D689 Coagulation defect, unspecified: Secondary | ICD-10-CM | POA: Diagnosis not present

## 2017-06-15 DIAGNOSIS — N2581 Secondary hyperparathyroidism of renal origin: Secondary | ICD-10-CM | POA: Diagnosis not present

## 2017-06-15 DIAGNOSIS — N186 End stage renal disease: Secondary | ICD-10-CM | POA: Diagnosis not present

## 2017-06-15 DIAGNOSIS — T8249XA Other complication of vascular dialysis catheter, initial encounter: Secondary | ICD-10-CM | POA: Diagnosis not present

## 2017-06-15 DIAGNOSIS — Z111 Encounter for screening for respiratory tuberculosis: Secondary | ICD-10-CM | POA: Diagnosis not present

## 2017-06-16 DIAGNOSIS — Z992 Dependence on renal dialysis: Secondary | ICD-10-CM | POA: Diagnosis not present

## 2017-06-16 DIAGNOSIS — I132 Hypertensive heart and chronic kidney disease with heart failure and with stage 5 chronic kidney disease, or end stage renal disease: Secondary | ICD-10-CM | POA: Diagnosis not present

## 2017-06-16 DIAGNOSIS — Z0000 Encounter for general adult medical examination without abnormal findings: Secondary | ICD-10-CM | POA: Diagnosis not present

## 2017-06-16 DIAGNOSIS — N186 End stage renal disease: Secondary | ICD-10-CM | POA: Diagnosis not present

## 2017-06-17 DIAGNOSIS — T8249XA Other complication of vascular dialysis catheter, initial encounter: Secondary | ICD-10-CM | POA: Diagnosis not present

## 2017-06-17 DIAGNOSIS — I129 Hypertensive chronic kidney disease with stage 1 through stage 4 chronic kidney disease, or unspecified chronic kidney disease: Secondary | ICD-10-CM | POA: Diagnosis not present

## 2017-06-17 DIAGNOSIS — Z111 Encounter for screening for respiratory tuberculosis: Secondary | ICD-10-CM | POA: Diagnosis not present

## 2017-06-17 DIAGNOSIS — N2581 Secondary hyperparathyroidism of renal origin: Secondary | ICD-10-CM | POA: Diagnosis not present

## 2017-06-17 DIAGNOSIS — D631 Anemia in chronic kidney disease: Secondary | ICD-10-CM | POA: Diagnosis not present

## 2017-06-17 DIAGNOSIS — D689 Coagulation defect, unspecified: Secondary | ICD-10-CM | POA: Diagnosis not present

## 2017-06-17 DIAGNOSIS — N186 End stage renal disease: Secondary | ICD-10-CM | POA: Diagnosis not present

## 2017-06-17 DIAGNOSIS — Z992 Dependence on renal dialysis: Secondary | ICD-10-CM | POA: Diagnosis not present

## 2017-06-20 DIAGNOSIS — T8249XA Other complication of vascular dialysis catheter, initial encounter: Secondary | ICD-10-CM | POA: Diagnosis not present

## 2017-06-20 DIAGNOSIS — N186 End stage renal disease: Secondary | ICD-10-CM | POA: Diagnosis not present

## 2017-06-20 DIAGNOSIS — D689 Coagulation defect, unspecified: Secondary | ICD-10-CM | POA: Diagnosis not present

## 2017-06-20 DIAGNOSIS — D509 Iron deficiency anemia, unspecified: Secondary | ICD-10-CM | POA: Diagnosis not present

## 2017-06-22 DIAGNOSIS — T8249XA Other complication of vascular dialysis catheter, initial encounter: Secondary | ICD-10-CM | POA: Diagnosis not present

## 2017-06-22 DIAGNOSIS — D509 Iron deficiency anemia, unspecified: Secondary | ICD-10-CM | POA: Diagnosis not present

## 2017-06-22 DIAGNOSIS — D689 Coagulation defect, unspecified: Secondary | ICD-10-CM | POA: Diagnosis not present

## 2017-06-22 DIAGNOSIS — N186 End stage renal disease: Secondary | ICD-10-CM | POA: Diagnosis not present

## 2017-06-24 DIAGNOSIS — D509 Iron deficiency anemia, unspecified: Secondary | ICD-10-CM | POA: Diagnosis not present

## 2017-06-24 DIAGNOSIS — D689 Coagulation defect, unspecified: Secondary | ICD-10-CM | POA: Diagnosis not present

## 2017-06-24 DIAGNOSIS — N186 End stage renal disease: Secondary | ICD-10-CM | POA: Diagnosis not present

## 2017-06-24 DIAGNOSIS — T8249XA Other complication of vascular dialysis catheter, initial encounter: Secondary | ICD-10-CM | POA: Diagnosis not present

## 2017-06-27 DIAGNOSIS — N186 End stage renal disease: Secondary | ICD-10-CM | POA: Diagnosis not present

## 2017-06-27 DIAGNOSIS — T8249XA Other complication of vascular dialysis catheter, initial encounter: Secondary | ICD-10-CM | POA: Diagnosis not present

## 2017-06-27 DIAGNOSIS — D509 Iron deficiency anemia, unspecified: Secondary | ICD-10-CM | POA: Diagnosis not present

## 2017-06-27 DIAGNOSIS — D689 Coagulation defect, unspecified: Secondary | ICD-10-CM | POA: Diagnosis not present

## 2017-06-29 DIAGNOSIS — D509 Iron deficiency anemia, unspecified: Secondary | ICD-10-CM | POA: Diagnosis not present

## 2017-06-29 DIAGNOSIS — T8249XA Other complication of vascular dialysis catheter, initial encounter: Secondary | ICD-10-CM | POA: Diagnosis not present

## 2017-06-29 DIAGNOSIS — N186 End stage renal disease: Secondary | ICD-10-CM | POA: Diagnosis not present

## 2017-06-29 DIAGNOSIS — D689 Coagulation defect, unspecified: Secondary | ICD-10-CM | POA: Diagnosis not present

## 2017-07-01 DIAGNOSIS — D631 Anemia in chronic kidney disease: Secondary | ICD-10-CM | POA: Diagnosis not present

## 2017-07-01 DIAGNOSIS — D509 Iron deficiency anemia, unspecified: Secondary | ICD-10-CM | POA: Diagnosis not present

## 2017-07-01 DIAGNOSIS — Z23 Encounter for immunization: Secondary | ICD-10-CM | POA: Diagnosis not present

## 2017-07-01 DIAGNOSIS — T8249XA Other complication of vascular dialysis catheter, initial encounter: Secondary | ICD-10-CM | POA: Diagnosis not present

## 2017-07-01 DIAGNOSIS — N2581 Secondary hyperparathyroidism of renal origin: Secondary | ICD-10-CM | POA: Diagnosis not present

## 2017-07-01 DIAGNOSIS — N186 End stage renal disease: Secondary | ICD-10-CM | POA: Diagnosis not present

## 2017-07-01 DIAGNOSIS — D689 Coagulation defect, unspecified: Secondary | ICD-10-CM | POA: Diagnosis not present

## 2017-07-04 DIAGNOSIS — N186 End stage renal disease: Secondary | ICD-10-CM | POA: Diagnosis not present

## 2017-07-06 DIAGNOSIS — N186 End stage renal disease: Secondary | ICD-10-CM | POA: Diagnosis not present

## 2017-07-08 ENCOUNTER — Encounter: Payer: Self-pay | Admitting: *Deleted

## 2017-07-08 ENCOUNTER — Ambulatory Visit: Payer: Self-pay | Admitting: Vascular Surgery

## 2017-07-08 ENCOUNTER — Encounter: Payer: Self-pay | Admitting: Vascular Surgery

## 2017-07-08 ENCOUNTER — Other Ambulatory Visit: Payer: Self-pay | Admitting: *Deleted

## 2017-07-08 ENCOUNTER — Ambulatory Visit (HOSPITAL_COMMUNITY)
Admission: RE | Admit: 2017-07-08 | Discharge: 2017-07-08 | Disposition: A | Discharge status: Home / Self Care | Payer: Medicare Other | Source: Ambulatory Visit | Attending: Family | Admitting: Family

## 2017-07-08 VITALS — BP 168/81 | HR 74 | Resp 16 | Ht 65.0 in | Wt 148.0 lb

## 2017-07-08 DIAGNOSIS — N186 End stage renal disease: Secondary | ICD-10-CM | POA: Diagnosis not present

## 2017-07-08 DIAGNOSIS — Z992 Dependence on renal dialysis: Secondary | ICD-10-CM

## 2017-07-08 DIAGNOSIS — Z48812 Encounter for surgical aftercare following surgery on the circulatory system: Secondary | ICD-10-CM | POA: Diagnosis not present

## 2017-07-08 NOTE — Progress Notes (Signed)
Patient ID: Kristin Gross, female   DOB: January 28, 1934, 83 y.o.   MRN: [id number]  Reason for Consult: AAA and Acute Renal Failure   Referred by [name], MD  Subjective:     HPI:  Kristin Gross is a 83 y.o. female with history of open aneurysm repair from which she did very well.  She has recently had a left arm basilic vein transposition and tunneled dialysis catheter.  She is now on dialysis Monday Wednesdays and Fridays via the catheter.  She presents today for evaluation of her left upper extremity for second stage basilic vein transposition fistula.  She has not had any steal symptoms.  Incisions of all healed well.  Past Medical History:  Diagnosis Date  . AAA (abdominal aortic aneurysm) ([place])   . Chronic kidney disease   . Hyperlipidemia   . Hypertension   . PONV (postoperative nausea and vomiting)   . Thyroid disease    Family History  Problem Relation Age of Onset  . Heart disease Sister   . Heart attack Sister   . Hypertension Sister   . Cancer Mother    Past Surgical History:  Procedure Laterality Date  . ABDOMINAL HYSTERECTOMY    . AORTA - BILATERAL FEMORAL ARTERY BYPASS GRAFT N/A 03/24/2017   Procedure: AORTO BI ILIAC BYPASS GRAFT;  Surgeon: Waynetta Sandy, MD;  Location: [place];  Service: Vascular;  Laterality: N/A;  . AV FISTULA PLACEMENT Left 05/25/2017   Procedure: ARTERIOVENOUS (AV) FISTULA CREATION LEFT ARM;  Surgeon: Waynetta Sandy, MD;  Location: [facility];  Service: Vascular;  Laterality: Left;  . CATARACT EXTRACTION, BILATERAL    . EYE SURGERY    . INSERTION OF DIALYSIS CATHETER Right 05/25/2017   Procedure: INSERTION OF TUNNELED  DIALYSIS CATHETER RIGHT INTERNAL JUGULAR;  Surgeon: Waynetta Sandy, MD;  Location: [facility];  Service: Vascular;  Laterality: Right;    Short Social History:  Social History   Tobacco Use  . Smoking status: Current Some Day Smoker    Packs/day: 1.00    Years: 60.00    Pack years: 60.00   Types: Cigarettes  . Smokeless tobacco: Never Used  Substance Use Topics  . Alcohol use: No    Frequency: Never    Allergies  Allergen Reactions  . Lisinopril Swelling    SWELLING REACTION UNSPECIFIED     Current Outpatient Medications  Medication Sig Dispense Refill  . acetaminophen (TYLENOL) 500 MG tablet Take 500 mg by mouth daily as needed for moderate pain or headache.    Marland Kitchen aspirin 81 MG tablet Take 81 mg by mouth daily.    Marland Kitchen levothyroxine (SYNTHROID, LEVOTHROID) 100 MCG tablet Take 100 mcg by mouth daily.  3  . metoprolol tartrate (LOPRESSOR) 50 MG tablet Take 50 mg by mouth 2 (two) times daily.    . multivitamin (RENA-VIT) TABS tablet Take 1 tablet by mouth daily.  10  . rosuvastatin (CRESTOR) 10 MG tablet Take 10 mg by mouth 3 (three) times a week.      No current facility-administered medications for this visit.     Review of Systems  Constitutional:  Constitutional negative. HENT: HENT negative.  Eyes: Eyes negative.  Cardiovascular: Cardiovascular negative.  GI: Gastrointestinal negative.  Musculoskeletal: Musculoskeletal negative.  Hematologic: Hematologic/lymphatic negative.  Psychiatric: Psychiatric negative.        Objective:  Objective   Vitals:   07/08/17 1431  BP: (!) 168/81  Pulse: 74  Resp: 16  SpO2: 98%  Weight: 148 lb (67.1 kg)  Height: 5\' 5"  (1.651 m)   Body mass index is 24.63 kg/m.  Physical Exam Awake alert and oriented Nonlabored respirations Abdominal incision is well-healed Left arm was strong thrill in the upper extremity There is a palpable radial pulse in the left wrist Left hand sensorimotor intact   Data: I have independently interpreted her dialysis access duplex which demonstrates flow at 1723 mL/min and a diameter up to 0.69 cm in the fistula.     Assessment/Plan:     83 year old female status post first stage basilic vein transposition fistula.  We will plan for second stage in the near future on a nondialysis  day.  I discussed the risk and benefits of proceeding she demonstrates good understanding we will get her scheduled very soon.     Waynetta Sandy MD [facility]

## 2017-07-11 DIAGNOSIS — N186 End stage renal disease: Secondary | ICD-10-CM | POA: Diagnosis not present

## 2017-07-13 DIAGNOSIS — N186 End stage renal disease: Secondary | ICD-10-CM | POA: Diagnosis not present

## 2017-07-15 DIAGNOSIS — N186 End stage renal disease: Secondary | ICD-10-CM | POA: Diagnosis not present

## 2017-07-17 DIAGNOSIS — I129 Hypertensive chronic kidney disease with stage 1 through stage 4 chronic kidney disease, or unspecified chronic kidney disease: Secondary | ICD-10-CM | POA: Diagnosis not present

## 2017-07-17 DIAGNOSIS — Z992 Dependence on renal dialysis: Secondary | ICD-10-CM | POA: Diagnosis not present

## 2017-07-17 DIAGNOSIS — N186 End stage renal disease: Secondary | ICD-10-CM | POA: Diagnosis not present

## 2017-07-18 DIAGNOSIS — D689 Coagulation defect, unspecified: Secondary | ICD-10-CM | POA: Diagnosis not present

## 2017-07-18 DIAGNOSIS — T8249XA Other complication of vascular dialysis catheter, initial encounter: Secondary | ICD-10-CM | POA: Diagnosis not present

## 2017-07-18 DIAGNOSIS — D509 Iron deficiency anemia, unspecified: Secondary | ICD-10-CM | POA: Diagnosis not present

## 2017-07-18 DIAGNOSIS — N186 End stage renal disease: Secondary | ICD-10-CM | POA: Diagnosis not present

## 2017-07-20 DIAGNOSIS — T8249XA Other complication of vascular dialysis catheter, initial encounter: Secondary | ICD-10-CM | POA: Diagnosis not present

## 2017-07-20 DIAGNOSIS — D689 Coagulation defect, unspecified: Secondary | ICD-10-CM | POA: Diagnosis not present

## 2017-07-20 DIAGNOSIS — D509 Iron deficiency anemia, unspecified: Secondary | ICD-10-CM | POA: Diagnosis not present

## 2017-07-20 DIAGNOSIS — N186 End stage renal disease: Secondary | ICD-10-CM | POA: Diagnosis not present

## 2017-07-22 DIAGNOSIS — D509 Iron deficiency anemia, unspecified: Secondary | ICD-10-CM | POA: Diagnosis not present

## 2017-07-22 DIAGNOSIS — T8249XA Other complication of vascular dialysis catheter, initial encounter: Secondary | ICD-10-CM | POA: Diagnosis not present

## 2017-07-22 DIAGNOSIS — D689 Coagulation defect, unspecified: Secondary | ICD-10-CM | POA: Diagnosis not present

## 2017-07-22 DIAGNOSIS — N186 End stage renal disease: Secondary | ICD-10-CM | POA: Diagnosis not present

## 2017-07-25 DIAGNOSIS — D689 Coagulation defect, unspecified: Secondary | ICD-10-CM | POA: Diagnosis not present

## 2017-07-25 DIAGNOSIS — T8249XA Other complication of vascular dialysis catheter, initial encounter: Secondary | ICD-10-CM | POA: Diagnosis not present

## 2017-07-25 DIAGNOSIS — N186 End stage renal disease: Secondary | ICD-10-CM | POA: Diagnosis not present

## 2017-07-25 DIAGNOSIS — D509 Iron deficiency anemia, unspecified: Secondary | ICD-10-CM | POA: Diagnosis not present

## 2017-07-27 DIAGNOSIS — N186 End stage renal disease: Secondary | ICD-10-CM | POA: Diagnosis not present

## 2017-07-27 DIAGNOSIS — D509 Iron deficiency anemia, unspecified: Secondary | ICD-10-CM | POA: Diagnosis not present

## 2017-07-27 DIAGNOSIS — T8249XA Other complication of vascular dialysis catheter, initial encounter: Secondary | ICD-10-CM | POA: Diagnosis not present

## 2017-07-27 DIAGNOSIS — D689 Coagulation defect, unspecified: Secondary | ICD-10-CM | POA: Diagnosis not present

## 2017-07-29 DIAGNOSIS — T8249XA Other complication of vascular dialysis catheter, initial encounter: Secondary | ICD-10-CM | POA: Diagnosis not present

## 2017-07-29 DIAGNOSIS — D509 Iron deficiency anemia, unspecified: Secondary | ICD-10-CM | POA: Diagnosis not present

## 2017-07-29 DIAGNOSIS — D689 Coagulation defect, unspecified: Secondary | ICD-10-CM | POA: Diagnosis not present

## 2017-07-29 DIAGNOSIS — N186 End stage renal disease: Secondary | ICD-10-CM | POA: Diagnosis not present

## 2017-08-01 ENCOUNTER — Encounter (HOSPITAL_COMMUNITY): Payer: Self-pay | Admitting: *Deleted

## 2017-08-01 ENCOUNTER — Other Ambulatory Visit: Payer: Self-pay

## 2017-08-01 DIAGNOSIS — D509 Iron deficiency anemia, unspecified: Secondary | ICD-10-CM | POA: Diagnosis not present

## 2017-08-01 DIAGNOSIS — D689 Coagulation defect, unspecified: Secondary | ICD-10-CM | POA: Diagnosis not present

## 2017-08-01 DIAGNOSIS — N186 End stage renal disease: Secondary | ICD-10-CM | POA: Diagnosis not present

## 2017-08-01 DIAGNOSIS — T8249XA Other complication of vascular dialysis catheter, initial encounter: Secondary | ICD-10-CM | POA: Diagnosis not present

## 2017-08-01 NOTE — Progress Notes (Signed)
Pt SDW-pre-op call completed by pt daughter Lattie Haw, ([place]). Daughter denies any acute cardiopulmonary issues. Daughter denies that pt is under the care of a cardiologist. Daughter denies that pt had a cardiac cath. Daughter made aware to have pt stop taking vitamins, fish oil, herbal medications. Do not take any NSAIDs ie: Ibuprofen, Advil, Naproxen (Aleve), Motrin, BC and Goody Powder. Daughter verbalized understanding of all pre-op instructions.

## 2017-08-02 ENCOUNTER — Ambulatory Visit (HOSPITAL_COMMUNITY): Payer: Medicare Other | Admitting: Certified Registered Nurse Anesthetist

## 2017-08-02 ENCOUNTER — Encounter (HOSPITAL_COMMUNITY)
Admission: RE | Disposition: A | Discharge status: Home / Self Care | Payer: Self-pay | Source: Ambulatory Visit | Attending: Vascular Surgery

## 2017-08-02 ENCOUNTER — Encounter (HOSPITAL_COMMUNITY): Payer: Self-pay

## 2017-08-02 ENCOUNTER — Ambulatory Visit (HOSPITAL_COMMUNITY)
Admission: RE | Admit: 2017-08-02 | Discharge: 2017-08-02 | Disposition: A | Discharge status: Home / Self Care | Payer: Medicare Other | Source: Ambulatory Visit | Attending: Vascular Surgery | Admitting: Vascular Surgery

## 2017-08-02 DIAGNOSIS — Z9842 Cataract extraction status, left eye: Secondary | ICD-10-CM | POA: Diagnosis not present

## 2017-08-02 DIAGNOSIS — N186 End stage renal disease: Secondary | ICD-10-CM | POA: Diagnosis not present

## 2017-08-02 DIAGNOSIS — Z8249 Family history of ischemic heart disease and other diseases of the circulatory system: Secondary | ICD-10-CM | POA: Diagnosis not present

## 2017-08-02 DIAGNOSIS — Z888 Allergy status to other drugs, medicaments and biological substances status: Secondary | ICD-10-CM | POA: Insufficient documentation

## 2017-08-02 DIAGNOSIS — Z79899 Other long term (current) drug therapy: Secondary | ICD-10-CM | POA: Diagnosis not present

## 2017-08-02 DIAGNOSIS — Z992 Dependence on renal dialysis: Secondary | ICD-10-CM | POA: Insufficient documentation

## 2017-08-02 DIAGNOSIS — F17210 Nicotine dependence, cigarettes, uncomplicated: Secondary | ICD-10-CM | POA: Diagnosis not present

## 2017-08-02 DIAGNOSIS — I120 Hypertensive chronic kidney disease with stage 5 chronic kidney disease or end stage renal disease: Secondary | ICD-10-CM | POA: Insufficient documentation

## 2017-08-02 DIAGNOSIS — Z9841 Cataract extraction status, right eye: Secondary | ICD-10-CM | POA: Insufficient documentation

## 2017-08-02 DIAGNOSIS — Z951 Presence of aortocoronary bypass graft: Secondary | ICD-10-CM | POA: Diagnosis not present

## 2017-08-02 DIAGNOSIS — Z9889 Other specified postprocedural states: Secondary | ICD-10-CM | POA: Diagnosis not present

## 2017-08-02 DIAGNOSIS — Z90710 Acquired absence of both cervix and uterus: Secondary | ICD-10-CM | POA: Insufficient documentation

## 2017-08-02 DIAGNOSIS — E039 Hypothyroidism, unspecified: Secondary | ICD-10-CM | POA: Insufficient documentation

## 2017-08-02 DIAGNOSIS — Z7982 Long term (current) use of aspirin: Secondary | ICD-10-CM | POA: Diagnosis not present

## 2017-08-02 DIAGNOSIS — I714 Abdominal aortic aneurysm, without rupture: Secondary | ICD-10-CM | POA: Insufficient documentation

## 2017-08-02 DIAGNOSIS — E785 Hyperlipidemia, unspecified: Secondary | ICD-10-CM | POA: Diagnosis not present

## 2017-08-02 DIAGNOSIS — Z79890 Hormone replacement therapy (postmenopausal): Secondary | ICD-10-CM | POA: Insufficient documentation

## 2017-08-02 DIAGNOSIS — I129 Hypertensive chronic kidney disease with stage 1 through stage 4 chronic kidney disease, or unspecified chronic kidney disease: Secondary | ICD-10-CM

## 2017-08-02 HISTORY — PX: BASCILIC VEIN TRANSPOSITION: SHX5742

## 2017-08-02 LAB — POCT I-STAT 4, (NA,K, GLUC, HGB,HCT)
GLUCOSE: 92 mg/dL (ref 70–99)
HEMATOCRIT: 38 % (ref 36.0–46.0)
HEMOGLOBIN: 12.9 g/dL (ref 12.0–15.0)
Potassium: 3.4 mmol/L — ABNORMAL LOW (ref 3.5–5.1)
Sodium: 136 mmol/L (ref 135–145)

## 2017-08-02 SURGERY — TRANSPOSITION, VEIN, BASILIC
Anesthesia: Monitor Anesthesia Care | Site: Arm Upper | Laterality: Left

## 2017-08-02 MED ORDER — PROPOFOL 500 MG/50ML IV EMUL
INTRAVENOUS | Status: DC | PRN
  Administered 2017-08-02: 75 ug/kg/min via INTRAVENOUS

## 2017-08-02 MED ORDER — LIDOCAINE-EPINEPHRINE (PF) 1 %-1:200000 IJ SOLN
INTRAMUSCULAR | Status: AC
  Filled 2017-08-02: qty 30

## 2017-08-02 MED ORDER — PHENYLEPHRINE HCL 10 MG/ML IJ SOLN
INTRAMUSCULAR | Status: DC | PRN
  Administered 2017-08-02 (×6): 80 ug via INTRAVENOUS

## 2017-08-02 MED ORDER — 0.9 % SODIUM CHLORIDE (POUR BTL) OPTIME
TOPICAL | Status: DC | PRN
  Administered 2017-08-02: 1000 mL

## 2017-08-02 MED ORDER — OXYCODONE-ACETAMINOPHEN 5-325 MG PO TABS: 1.0000 | ORAL_TABLET | Freq: Four times a day (QID) | ORAL | 0 refills | Status: DC | PRN

## 2017-08-02 MED ORDER — CEFAZOLIN SODIUM-DEXTROSE 2-4 GM/100ML-% IV SOLN
2.0000 g | INTRAVENOUS | Status: AC
  Administered 2017-08-02: 2 g via INTRAVENOUS

## 2017-08-02 MED ORDER — SODIUM CHLORIDE 0.9 % IV SOLN
INTRAVENOUS | Status: DC | PRN
  Administered 2017-08-02: 40 ug/min via INTRAVENOUS

## 2017-08-02 MED ORDER — SODIUM CHLORIDE 0.9 % IV SOLN
INTRAVENOUS | Status: AC
  Filled 2017-08-02: qty 1.2

## 2017-08-02 MED ORDER — OXYCODONE HCL 5 MG/5ML PO SOLN: 5.0000 mg | Freq: Once | ORAL | Status: DC | PRN

## 2017-08-02 MED ORDER — FENTANYL CITRATE (PF) 250 MCG/5ML IJ SOLN
INTRAMUSCULAR | Status: AC
  Filled 2017-08-02: qty 5

## 2017-08-02 MED ORDER — CEFAZOLIN SODIUM-DEXTROSE 2-4 GM/100ML-% IV SOLN
INTRAVENOUS | Status: AC
  Filled 2017-08-02: qty 100

## 2017-08-02 MED ORDER — OXYCODONE HCL 5 MG PO TABS: 5.0000 mg | ORAL_TABLET | Freq: Once | ORAL | Status: DC | PRN

## 2017-08-02 MED ORDER — ONDANSETRON HCL 4 MG/2ML IJ SOLN: 4.0000 mg | Freq: Once | INTRAMUSCULAR | Status: DC | PRN

## 2017-08-02 MED ORDER — FENTANYL CITRATE (PF) 100 MCG/2ML IJ SOLN: 25.0000 ug | INTRAMUSCULAR | Status: DC | PRN

## 2017-08-02 MED ORDER — PROPOFOL 10 MG/ML IV BOLUS
INTRAVENOUS | Status: DC | PRN
  Administered 2017-08-02: 20 mg via INTRAVENOUS

## 2017-08-02 MED ORDER — LIDOCAINE-EPINEPHRINE (PF) 1 %-1:200000 IJ SOLN
INTRAMUSCULAR | Status: DC | PRN
  Administered 2017-08-02: 35 mL

## 2017-08-02 MED ORDER — ONDANSETRON HCL 4 MG/2ML IJ SOLN
4.0000 mg | Freq: Once | INTRAMUSCULAR | Status: DC | PRN
Start: 2017-08-02 — End: 2017-08-02

## 2017-08-02 MED ORDER — SODIUM CHLORIDE 0.9 % IV SOLN
INTRAVENOUS | Status: DC
  Administered 2017-08-02: 07:00:00 via INTRAVENOUS

## 2017-08-02 MED ORDER — SODIUM CHLORIDE 0.9 % IV SOLN
INTRAVENOUS | Status: DC | PRN
  Administered 2017-08-02: 500 mL

## 2017-08-02 SURGICAL SUPPLY — 32 items
ARMBAND PINK RESTRICT EXTREMIT (MISCELLANEOUS) ×2 IMPLANT
CANISTER SUCT 3000ML PPV (MISCELLANEOUS) ×2 IMPLANT
CLIP VESOCCLUDE MED 24/CT (CLIP) IMPLANT
CLIP VESOCCLUDE MED 6/CT (CLIP) IMPLANT
CLIP VESOCCLUDE SM WIDE 24/CT (CLIP) IMPLANT
CLIP VESOCCLUDE SM WIDE 6/CT (CLIP) IMPLANT
COVER PROBE W GEL 5X96 (DRAPES) ×2 IMPLANT
DERMABOND ADVANCED (GAUZE/BANDAGES/DRESSINGS) ×1
DERMABOND ADVANCED .7 DNX12 (GAUZE/BANDAGES/DRESSINGS) ×1 IMPLANT
ELECT REM PT RETURN 9FT ADLT (ELECTROSURGICAL) ×2
ELECTRODE REM PT RTRN 9FT ADLT (ELECTROSURGICAL) ×1 IMPLANT
GLOVE BIO SURGEON STRL SZ7.5 (GLOVE) ×2 IMPLANT
GLOVE BIOGEL PI IND STRL 6.5 (GLOVE) ×1 IMPLANT
GLOVE BIOGEL PI INDICATOR 6.5 (GLOVE) ×1
GOWN STRL REUS W/ TWL LRG LVL3 (GOWN DISPOSABLE) ×2 IMPLANT
GOWN STRL REUS W/ TWL XL LVL3 (GOWN DISPOSABLE) ×1 IMPLANT
GOWN STRL REUS W/TWL LRG LVL3 (GOWN DISPOSABLE) ×2
GOWN STRL REUS W/TWL XL LVL3 (GOWN DISPOSABLE) ×1
KIT BASIN OR (CUSTOM PROCEDURE TRAY) ×2 IMPLANT
KIT TURNOVER KIT B (KITS) ×2 IMPLANT
NS IRRIG 1000ML POUR BTL (IV SOLUTION) ×2 IMPLANT
PACK CV ACCESS (CUSTOM PROCEDURE TRAY) ×2 IMPLANT
PAD ARMBOARD 7.5X6 YLW CONV (MISCELLANEOUS) ×4 IMPLANT
SUT MNCRL AB 4-0 PS2 18 (SUTURE) ×2 IMPLANT
SUT PROLENE 6 0 BV (SUTURE) ×4 IMPLANT
SUT SILK 2 0 SH (SUTURE) IMPLANT
SUT VIC AB 3-0 SH 27 (SUTURE) ×2
SUT VIC AB 3-0 SH 27X BRD (SUTURE) ×2 IMPLANT
TOWEL GREEN STERILE (TOWEL DISPOSABLE) ×2 IMPLANT
TUBING SUCTION BULK 100 FT (MISCELLANEOUS) ×2 IMPLANT
UNDERPAD 30X30 (UNDERPADS AND DIAPERS) ×2 IMPLANT
WATER STERILE IRR 1000ML POUR (IV SOLUTION) ×2 IMPLANT

## 2017-08-02 NOTE — Transfer of Care (Signed)
Immediate Anesthesia Transfer of Care Note  Patient: Kristin Gross  Procedure(s) Performed: BASILIC VEIN TRANSPOSITION SECOND STAGE (Left Arm Upper)  Patient Location: PACU  Anesthesia Type:MAC  Level of Consciousness: awake, alert  and oriented  Airway & Oxygen Therapy: Patient Spontanous Breathing  Post-op Assessment: Report given to RN, Post -op Vital signs reviewed and stable and Patient moving all extremities  Post vital signs: Reviewed and stable  Last Vitals:  Vitals Value Taken Time  BP 105/75 08/02/2017 10:22 AM  Temp    Pulse 71 08/02/2017 10:25 AM  Resp 23 08/02/2017 10:25 AM  SpO2 92 % 08/02/2017 10:25 AM  Vitals shown include unvalidated device data.  Last Pain:  Vitals:   08/02/17 0719  TempSrc:   PainSc: 0-No pain      Patients Stated Pain Goal: 2 (47/82/95 6213)  Complications: No apparent anesthesia complications

## 2017-08-02 NOTE — H&P (Signed)
   History and Physical Update  The patient was interviewed and re-examined.  The patient's previous History and Physical has been reviewed and is unchanged from recent office visit. Plan for left 2nd stage bvt.   Yael Coppess C. Donzetta Matters, MD [facility] [place] Office: [phone] Pager: [phone]   08/02/2017, 7:35 AM

## 2017-08-02 NOTE — Anesthesia Postprocedure Evaluation (Signed)
Anesthesia Post Note  Patient: Kristin Gross  Procedure(s) Performed: BASILIC VEIN TRANSPOSITION SECOND STAGE (Left Arm Upper)     Patient location during evaluation: PACU Anesthesia Type: MAC Level of consciousness: awake and alert Pain management: pain level controlled Vital Signs Assessment: post-procedure vital signs reviewed and stable Respiratory status: spontaneous breathing, nonlabored ventilation, respiratory function stable and patient connected to nasal cannula oxygen Cardiovascular status: stable and blood pressure returned to baseline Postop Assessment: no apparent nausea or vomiting Anesthetic complications: no    Last Vitals:  Vitals:   08/02/17 1036 08/02/17 1051  BP: 120/78 112/78  Pulse: 70 65  Resp: 15 16  Temp:  (!) 36.3 C  SpO2: 93% 93%    Last Pain:  Vitals:   08/02/17 1022  TempSrc:   PainSc: 0-No pain                 Makya Phillis COKER

## 2017-08-02 NOTE — Progress Notes (Signed)
Notified Dr. Linna Caprice regarding pt's elevated BP. Pt stated she took her metoprolol this morning. Dr. Linna Caprice will be rounding on pt soon.   Jacqlyn Larsen, RN

## 2017-08-02 NOTE — Op Note (Signed)
    Patient name: ANANIAH MACIOLEK MRN: [id number] DOB: 13-Feb-1934 Sex: female  08/02/2017 Pre-operative Diagnosis: End-stage renal disease Post-operative diagnosis:  Same Surgeon:  Erlene Quan C. Donzetta Matters, MD Assistant: Leontine Locket, PA  Procedure Performed: left second stage basilic vein transposition AV fistula  Indications: 83 year old female has previously undergone open aortic aneurysm repair.  That time she has chronic kidney disease and is now advanced end-stage renal.  She is on dialysis via tunneled catheter and has a first stage basilic vein fistula.  She is now indicated for elevation with second stage transposition.  Findings: Fistula was adequate size approximately 6 mm in diameter.  There is very strong inflow.  At completion was a palpable thrill in the runoff vein no superficial palpable radial pulse in the wrist.   Procedure:  The patient was identified in the holding area and taken to the operating room where she was placed supine the operative table and MAC anesthesia was induced.  In the usual fashion she was given antibiotics and a timeout was called.  We began with a longitudinal incision overlying the palpable thrill just above the antecubitum she was sterilely prepped and draped in the left upper extremity.  We dissected down onto the vein protected the nerve.  We then dissected the vein up so far as we could through a tunnel dividing branches between clips and ties.  We then made a second incision in the axilla dissected down onto the vein there again protecting the nerve.  Again branches were divided between clips and ties until the entire vein was free.  We marked it for orientation throughout its course clamped approximately and divided.  It was then tunneled superficially maintaining orientation.  It was flushed there is no leaking seem to flush easily.  It was then clamped and the 2 ends were spatulated.  There is an end-to-end with 6-0 Prolene sutures x2.  Prior to completing the  anastomosis we allowed flushing maneuvers and flushed with heparinized saline.  Upon completion there is a strong thrill in the vein some of soft tissue was freed up with electrocautery.  There was a palpable radial pulse in the wrist.  Satisfied we irrigated the wounds closed in layers with Vicryl suture.  Dermal was placed to level the skin.  She was allowed away from anesthesia having tolerated procedure without any comp occasion.  Next  EBL 50 cc.   Jeremaih Klima C. Donzetta Matters, MD [facility] [place] Office: [phone] Pager: [phone]

## 2017-08-02 NOTE — Discharge Instructions (Signed)
[facility]  Discharge Instructions  AV Fistula or Graft Surgery for Dialysis Access  Please refer to the following instructions for your post-procedure care. Your surgeon or physician assistant will discuss any changes with you.  Activity  You may drive the day following your surgery, if you are comfortable and no longer taking prescription pain medication. Resume full activity as the soreness in your incision resolves.  Bathing/Showering  You may shower after you go home. Keep your incision dry for 48 hours. Do not soak in a bathtub, hot tub, or swim until the incision heals completely. You may not shower if you have a hemodialysis catheter.  Incision Care  Clean your incision with mild soap and water after 48 hours. Pat the area dry with a clean towel. You do not need a bandage unless otherwise instructed. Do not apply any ointments or creams to your incision. You may have skin glue on your incision. Do not peel it off. It will come off on its own in about one week. Your arm may swell a bit after surgery. To reduce swelling use pillows to elevate your arm so it is above your heart. Your doctor will tell you if you need to lightly wrap your arm with an ACE bandage.  Diet  Resume your normal diet. There are not special food restrictions following this procedure. In order to heal from your surgery, it is CRITICAL to get adequate nutrition. Your body requires vitamins, minerals, and protein. Vegetables are the best source of vitamins and minerals. Vegetables also provide the perfect balance of protein. Processed food has little nutritional value, so try to avoid this.  Medications  Resume taking all of your medications. If your incision is causing pain, you may take over-the counter pain relievers such as acetaminophen (Tylenol). If you were prescribed a stronger pain medication, please be aware these medications can cause nausea and constipation. Prevent  nausea by taking the medication with a snack or meal. Avoid constipation by drinking plenty of fluids and eating foods with high amount of fiber, such as fruits, vegetables, and grains.  Do not take Tylenol if you are taking prescription pain medications.  Follow up Your surgeon may want to see you in the office following your access surgery. If so, this will be arranged at the time of your surgery.  Please call us immediately for any of the following conditions:  Increased pain, redness, drainage (pus) from your incision site Fever of 101 degrees or higher Severe or worsening pain at your incision site Hand pain or numbness.  Reduce your risk of vascular disease:  Stop smoking. If you would like help, call [facility] at 1-800-QUIT-NOW 216 707 1044) or [place] at [place] your cholesterol Maintain a desired weight Control your diabetes Keep your blood pressure down  Dialysis  It will take several weeks to several months for your new dialysis access to be ready for use. Your surgeon will determine when it is okay to use it. Your nephrologist will continue to direct your dialysis. You can continue to use your Permcath until your new access is ready for use.   08/02/2017 Kristin Gross [id number] 28-Sep-1934  Surgeon(s): Waynetta Sandy, MD  Procedure(s): BASILIC VEIN TRANSPOSITION SECOND STAGE  x Do not stick fistula for 4 weeks    If you have any questions, please call the office at [phone].   Post Anesthesia Home Care Instructions  Activity: Get plenty of rest for the remainder of  the day. A responsible individual must stay with you for 24 hours following the procedure.  For the next 24 hours, DO NOT: -Drive a car -Paediatric nurse -Drink alcoholic beverages -Take any medication unless instructed by your physician -Make any legal decisions or sign important papers.  Meals: Start with liquid foods such as gelatin or soup. Progress  to regular foods as tolerated. Avoid greasy, spicy, heavy foods. If nausea and/or vomiting occur, drink only clear liquids until the nausea and/or vomiting subsides. Call your physician if vomiting continues.  Special Instructions/Symptoms: Your throat may feel dry or sore from the anesthesia or the breathing tube placed in your throat during surgery. If this causes discomfort, gargle with warm salt water. The discomfort should disappear within 24 hours.  If you had a scopolamine patch placed behind your ear for the management of post- operative nausea and/or vomiting:  1. The medication in the patch is effective for 72 hours, after which it should be removed.  Wrap patch in a tissue and discard in the trash. Wash hands thoroughly with soap and water. 2. You may remove the patch earlier than 72 hours if you experience unpleasant side effects which may include dry mouth, dizziness or visual disturbances. 3. Avoid touching the patch. Wash your hands with soap and water after contact with the patch.

## 2017-08-02 NOTE — Anesthesia Preprocedure Evaluation (Addendum)
Anesthesia Evaluation  Patient identified by MRN, date of birth, ID band Patient awake    Reviewed: Allergy & Precautions, NPO status , Patient's Chart, lab work & pertinent test results  Airway Mallampati: II  TM Distance: >3 FB Neck ROM: Full    Dental  (+) Edentulous Upper, Partial Lower   Pulmonary Current Smoker,    breath sounds clear to auscultation       Cardiovascular hypertension,  Rhythm:Regular Rate:Normal     Neuro/Psych    GI/Hepatic   Endo/Other    Renal/GU      Musculoskeletal   Abdominal   Peds  Hematology   Anesthesia Other Findings   Reproductive/Obstetrics                             Anesthesia Physical Anesthesia Plan  ASA: III  Anesthesia Plan: MAC   Post-op Pain Management:    Induction: Intravenous  PONV Risk Score and Plan: Ondansetron and Dexamethasone  Airway Management Planned: Natural Airway and Simple Face Mask  Additional Equipment:   Intra-op Plan:   Post-operative Plan:   Informed Consent: I have reviewed the patients History and Physical, chart, labs and discussed the procedure including the risks, benefits and alternatives for the proposed anesthesia with the patient or authorized representative who has indicated his/her understanding and acceptance.   Dental advisory given  Plan Discussed with: CRNA and Anesthesiologist  Anesthesia Plan Comments:         Anesthesia Quick Evaluation

## 2017-08-03 ENCOUNTER — Telehealth: Payer: Self-pay | Admitting: Vascular Surgery

## 2017-08-03 ENCOUNTER — Encounter (HOSPITAL_COMMUNITY): Payer: Self-pay | Admitting: Vascular Surgery

## 2017-08-03 DIAGNOSIS — N186 End stage renal disease: Secondary | ICD-10-CM | POA: Diagnosis not present

## 2017-08-03 DIAGNOSIS — T8249XA Other complication of vascular dialysis catheter, initial encounter: Secondary | ICD-10-CM | POA: Diagnosis not present

## 2017-08-03 DIAGNOSIS — D509 Iron deficiency anemia, unspecified: Secondary | ICD-10-CM | POA: Diagnosis not present

## 2017-08-03 DIAGNOSIS — D689 Coagulation defect, unspecified: Secondary | ICD-10-CM | POA: Diagnosis not present

## 2017-08-03 NOTE — Telephone Encounter (Signed)
sch appt

## 2017-08-05 DIAGNOSIS — N186 End stage renal disease: Secondary | ICD-10-CM | POA: Diagnosis not present

## 2017-08-05 DIAGNOSIS — D509 Iron deficiency anemia, unspecified: Secondary | ICD-10-CM | POA: Diagnosis not present

## 2017-08-05 DIAGNOSIS — T8249XA Other complication of vascular dialysis catheter, initial encounter: Secondary | ICD-10-CM | POA: Diagnosis not present

## 2017-08-05 DIAGNOSIS — D689 Coagulation defect, unspecified: Secondary | ICD-10-CM | POA: Diagnosis not present

## 2017-08-08 DIAGNOSIS — N186 End stage renal disease: Secondary | ICD-10-CM | POA: Diagnosis not present

## 2017-08-08 DIAGNOSIS — T8249XA Other complication of vascular dialysis catheter, initial encounter: Secondary | ICD-10-CM | POA: Diagnosis not present

## 2017-08-08 DIAGNOSIS — D689 Coagulation defect, unspecified: Secondary | ICD-10-CM | POA: Diagnosis not present

## 2017-08-08 DIAGNOSIS — D509 Iron deficiency anemia, unspecified: Secondary | ICD-10-CM | POA: Diagnosis not present

## 2017-08-10 DIAGNOSIS — T8249XA Other complication of vascular dialysis catheter, initial encounter: Secondary | ICD-10-CM | POA: Diagnosis not present

## 2017-08-10 DIAGNOSIS — D689 Coagulation defect, unspecified: Secondary | ICD-10-CM | POA: Diagnosis not present

## 2017-08-10 DIAGNOSIS — N186 End stage renal disease: Secondary | ICD-10-CM | POA: Diagnosis not present

## 2017-08-10 DIAGNOSIS — D509 Iron deficiency anemia, unspecified: Secondary | ICD-10-CM | POA: Diagnosis not present

## 2017-08-12 DIAGNOSIS — N186 End stage renal disease: Secondary | ICD-10-CM | POA: Diagnosis not present

## 2017-08-15 DIAGNOSIS — N186 End stage renal disease: Secondary | ICD-10-CM | POA: Diagnosis not present

## 2017-08-16 DIAGNOSIS — E039 Hypothyroidism, unspecified: Secondary | ICD-10-CM | POA: Diagnosis not present

## 2017-08-17 DIAGNOSIS — Z992 Dependence on renal dialysis: Secondary | ICD-10-CM | POA: Diagnosis not present

## 2017-08-17 DIAGNOSIS — N186 End stage renal disease: Secondary | ICD-10-CM | POA: Diagnosis not present

## 2017-08-17 DIAGNOSIS — I129 Hypertensive chronic kidney disease with stage 1 through stage 4 chronic kidney disease, or unspecified chronic kidney disease: Secondary | ICD-10-CM | POA: Diagnosis not present

## 2017-08-19 DIAGNOSIS — N186 End stage renal disease: Secondary | ICD-10-CM | POA: Diagnosis not present

## 2017-08-19 DIAGNOSIS — D689 Coagulation defect, unspecified: Secondary | ICD-10-CM | POA: Diagnosis not present

## 2017-08-19 DIAGNOSIS — D509 Iron deficiency anemia, unspecified: Secondary | ICD-10-CM | POA: Diagnosis not present

## 2017-08-19 DIAGNOSIS — T8249XA Other complication of vascular dialysis catheter, initial encounter: Secondary | ICD-10-CM | POA: Diagnosis not present

## 2017-08-22 DIAGNOSIS — N186 End stage renal disease: Secondary | ICD-10-CM | POA: Diagnosis not present

## 2017-08-22 DIAGNOSIS — D509 Iron deficiency anemia, unspecified: Secondary | ICD-10-CM | POA: Diagnosis not present

## 2017-08-22 DIAGNOSIS — D689 Coagulation defect, unspecified: Secondary | ICD-10-CM | POA: Diagnosis not present

## 2017-08-22 DIAGNOSIS — T8249XA Other complication of vascular dialysis catheter, initial encounter: Secondary | ICD-10-CM | POA: Diagnosis not present

## 2017-08-24 DIAGNOSIS — D509 Iron deficiency anemia, unspecified: Secondary | ICD-10-CM | POA: Diagnosis not present

## 2017-08-24 DIAGNOSIS — T8249XA Other complication of vascular dialysis catheter, initial encounter: Secondary | ICD-10-CM | POA: Diagnosis not present

## 2017-08-24 DIAGNOSIS — N186 End stage renal disease: Secondary | ICD-10-CM | POA: Diagnosis not present

## 2017-08-24 DIAGNOSIS — D689 Coagulation defect, unspecified: Secondary | ICD-10-CM | POA: Diagnosis not present

## 2017-08-26 ENCOUNTER — Ambulatory Visit (INDEPENDENT_AMBULATORY_CARE_PROVIDER_SITE_OTHER): Payer: Self-pay | Admitting: Physician Assistant

## 2017-08-26 ENCOUNTER — Encounter: Payer: Self-pay | Admitting: Physician Assistant

## 2017-08-26 ENCOUNTER — Other Ambulatory Visit: Payer: Self-pay

## 2017-08-26 DIAGNOSIS — Z992 Dependence on renal dialysis: Secondary | ICD-10-CM

## 2017-08-26 DIAGNOSIS — D509 Iron deficiency anemia, unspecified: Secondary | ICD-10-CM | POA: Diagnosis not present

## 2017-08-26 DIAGNOSIS — D689 Coagulation defect, unspecified: Secondary | ICD-10-CM | POA: Diagnosis not present

## 2017-08-26 DIAGNOSIS — N186 End stage renal disease: Secondary | ICD-10-CM | POA: Insufficient documentation

## 2017-08-26 DIAGNOSIS — T8249XA Other complication of vascular dialysis catheter, initial encounter: Secondary | ICD-10-CM | POA: Diagnosis not present

## 2017-08-26 NOTE — Progress Notes (Signed)
    Postoperative Access Visit   History of Present Illness   Kristin Gross is a 83 y.o. year old female who presents for postoperative follow-up for: left second stage basilic vein transposition by Dr. Donzetta Matters (Date: 08/02/17).  The patient's wounds are healed.  The patient denies steal symptoms.  She says her arm is still tender to touch especially around the incision sites.  The patient is able to complete their activities of daily living.  She is dialyzing on a MWF via R IJ TDC under the care of Dr. Moshe Cipro.   Physical Examination   Vitals:   08/26/17 1312 08/26/17 1317  BP: (!) 154/75 (!) 146/66  Pulse: 69 69  Resp: 14   Temp: 98.6 F (37 C)   TempSrc: Oral   SpO2: 97%   Weight: 143 lb (64.9 kg)   Height: 5\' 5"  (1.651 m)    Body mass index is 23.8 kg/m.  left arm Incisions are healed, hand grip is 5/5, sensation in digits is intact, palpable thrill, bruit can be auscultated; palpable L radial pulse; resolving ecchymosis left upper arm     Medical Decision Making   Kristin Gross is a 83 y.o. year old female who presents s/p left second stage basilic vein transposition   The patient's access will be ready for use 09/12/17.  Patient requested that we delay start date due to persistent pain around incisions in arm.  She is aware of risk of infection and central vein stenosis with TDC in place  The patient's tunneled dialysis catheter can be removed when Nephrology is comfortable with the performance of her AV fistula  She will follow up in 10/2017 for scheduled ABIs   Dagoberto Ligas PA-C [facility] [place]: [phone]

## 2017-08-26 NOTE — Progress Notes (Signed)
Vitals:   08/26/17 1312 08/26/17 1317  BP: (!) 154/75 (!) 146/66  Pulse: 69 69  Resp: 14   Temp: 98.6 F (37 C)   TempSrc: Oral   SpO2: 97%   Weight: 143 lb (64.9 kg)   Height: 5\' 5"  (1.651 m)

## 2017-08-29 DIAGNOSIS — T8249XA Other complication of vascular dialysis catheter, initial encounter: Secondary | ICD-10-CM | POA: Diagnosis not present

## 2017-08-29 DIAGNOSIS — D509 Iron deficiency anemia, unspecified: Secondary | ICD-10-CM | POA: Diagnosis not present

## 2017-08-29 DIAGNOSIS — N186 End stage renal disease: Secondary | ICD-10-CM | POA: Diagnosis not present

## 2017-08-29 DIAGNOSIS — D689 Coagulation defect, unspecified: Secondary | ICD-10-CM | POA: Diagnosis not present

## 2017-08-31 DIAGNOSIS — D509 Iron deficiency anemia, unspecified: Secondary | ICD-10-CM | POA: Diagnosis not present

## 2017-08-31 DIAGNOSIS — D689 Coagulation defect, unspecified: Secondary | ICD-10-CM | POA: Diagnosis not present

## 2017-08-31 DIAGNOSIS — N186 End stage renal disease: Secondary | ICD-10-CM | POA: Diagnosis not present

## 2017-08-31 DIAGNOSIS — T8249XA Other complication of vascular dialysis catheter, initial encounter: Secondary | ICD-10-CM | POA: Diagnosis not present

## 2017-09-02 DIAGNOSIS — N186 End stage renal disease: Secondary | ICD-10-CM | POA: Diagnosis not present

## 2017-09-02 DIAGNOSIS — D509 Iron deficiency anemia, unspecified: Secondary | ICD-10-CM | POA: Diagnosis not present

## 2017-09-02 DIAGNOSIS — T8249XA Other complication of vascular dialysis catheter, initial encounter: Secondary | ICD-10-CM | POA: Diagnosis not present

## 2017-09-02 DIAGNOSIS — D689 Coagulation defect, unspecified: Secondary | ICD-10-CM | POA: Diagnosis not present

## 2017-09-05 DIAGNOSIS — D509 Iron deficiency anemia, unspecified: Secondary | ICD-10-CM | POA: Diagnosis not present

## 2017-09-05 DIAGNOSIS — T8249XA Other complication of vascular dialysis catheter, initial encounter: Secondary | ICD-10-CM | POA: Diagnosis not present

## 2017-09-05 DIAGNOSIS — D689 Coagulation defect, unspecified: Secondary | ICD-10-CM | POA: Diagnosis not present

## 2017-09-05 DIAGNOSIS — N186 End stage renal disease: Secondary | ICD-10-CM | POA: Diagnosis not present

## 2017-09-07 DIAGNOSIS — N186 End stage renal disease: Secondary | ICD-10-CM | POA: Diagnosis not present

## 2017-09-09 DIAGNOSIS — N186 End stage renal disease: Secondary | ICD-10-CM | POA: Diagnosis not present

## 2017-09-12 DIAGNOSIS — N186 End stage renal disease: Secondary | ICD-10-CM | POA: Diagnosis not present

## 2017-09-14 DIAGNOSIS — N186 End stage renal disease: Secondary | ICD-10-CM | POA: Diagnosis not present

## 2017-09-16 DIAGNOSIS — N186 End stage renal disease: Secondary | ICD-10-CM | POA: Diagnosis not present

## 2017-09-17 DIAGNOSIS — I129 Hypertensive chronic kidney disease with stage 1 through stage 4 chronic kidney disease, or unspecified chronic kidney disease: Secondary | ICD-10-CM | POA: Diagnosis not present

## 2017-09-17 DIAGNOSIS — N186 End stage renal disease: Secondary | ICD-10-CM | POA: Diagnosis not present

## 2017-09-17 DIAGNOSIS — Z992 Dependence on renal dialysis: Secondary | ICD-10-CM | POA: Diagnosis not present

## 2017-09-19 DIAGNOSIS — N186 End stage renal disease: Secondary | ICD-10-CM | POA: Diagnosis not present

## 2017-09-19 DIAGNOSIS — T8249XA Other complication of vascular dialysis catheter, initial encounter: Secondary | ICD-10-CM | POA: Diagnosis not present

## 2017-09-19 DIAGNOSIS — D689 Coagulation defect, unspecified: Secondary | ICD-10-CM | POA: Diagnosis not present

## 2017-09-19 DIAGNOSIS — N2581 Secondary hyperparathyroidism of renal origin: Secondary | ICD-10-CM | POA: Diagnosis not present

## 2017-09-21 DIAGNOSIS — T8249XA Other complication of vascular dialysis catheter, initial encounter: Secondary | ICD-10-CM | POA: Diagnosis not present

## 2017-09-21 DIAGNOSIS — D689 Coagulation defect, unspecified: Secondary | ICD-10-CM | POA: Diagnosis not present

## 2017-09-21 DIAGNOSIS — N186 End stage renal disease: Secondary | ICD-10-CM | POA: Diagnosis not present

## 2017-09-21 DIAGNOSIS — N2581 Secondary hyperparathyroidism of renal origin: Secondary | ICD-10-CM | POA: Diagnosis not present

## 2017-09-22 DIAGNOSIS — N186 End stage renal disease: Secondary | ICD-10-CM | POA: Diagnosis not present

## 2017-09-22 DIAGNOSIS — I132 Hypertensive heart and chronic kidney disease with heart failure and with stage 5 chronic kidney disease, or end stage renal disease: Secondary | ICD-10-CM | POA: Diagnosis not present

## 2017-09-22 DIAGNOSIS — E039 Hypothyroidism, unspecified: Secondary | ICD-10-CM | POA: Diagnosis not present

## 2017-09-22 DIAGNOSIS — Z992 Dependence on renal dialysis: Secondary | ICD-10-CM | POA: Diagnosis not present

## 2017-09-23 DIAGNOSIS — D689 Coagulation defect, unspecified: Secondary | ICD-10-CM | POA: Diagnosis not present

## 2017-09-23 DIAGNOSIS — N186 End stage renal disease: Secondary | ICD-10-CM | POA: Diagnosis not present

## 2017-09-23 DIAGNOSIS — T8249XA Other complication of vascular dialysis catheter, initial encounter: Secondary | ICD-10-CM | POA: Diagnosis not present

## 2017-09-23 DIAGNOSIS — N2581 Secondary hyperparathyroidism of renal origin: Secondary | ICD-10-CM | POA: Diagnosis not present

## 2017-09-26 DIAGNOSIS — N2581 Secondary hyperparathyroidism of renal origin: Secondary | ICD-10-CM | POA: Diagnosis not present

## 2017-09-26 DIAGNOSIS — D689 Coagulation defect, unspecified: Secondary | ICD-10-CM | POA: Diagnosis not present

## 2017-09-26 DIAGNOSIS — T8249XA Other complication of vascular dialysis catheter, initial encounter: Secondary | ICD-10-CM | POA: Diagnosis not present

## 2017-09-26 DIAGNOSIS — N186 End stage renal disease: Secondary | ICD-10-CM | POA: Diagnosis not present

## 2017-09-28 DIAGNOSIS — T8249XA Other complication of vascular dialysis catheter, initial encounter: Secondary | ICD-10-CM | POA: Diagnosis not present

## 2017-09-28 DIAGNOSIS — N2581 Secondary hyperparathyroidism of renal origin: Secondary | ICD-10-CM | POA: Diagnosis not present

## 2017-09-28 DIAGNOSIS — N186 End stage renal disease: Secondary | ICD-10-CM | POA: Diagnosis not present

## 2017-09-28 DIAGNOSIS — D689 Coagulation defect, unspecified: Secondary | ICD-10-CM | POA: Diagnosis not present

## 2017-09-30 DIAGNOSIS — N2581 Secondary hyperparathyroidism of renal origin: Secondary | ICD-10-CM | POA: Diagnosis not present

## 2017-09-30 DIAGNOSIS — D689 Coagulation defect, unspecified: Secondary | ICD-10-CM | POA: Diagnosis not present

## 2017-09-30 DIAGNOSIS — T8249XA Other complication of vascular dialysis catheter, initial encounter: Secondary | ICD-10-CM | POA: Diagnosis not present

## 2017-09-30 DIAGNOSIS — N186 End stage renal disease: Secondary | ICD-10-CM | POA: Diagnosis not present

## 2017-10-03 DIAGNOSIS — N2581 Secondary hyperparathyroidism of renal origin: Secondary | ICD-10-CM | POA: Diagnosis not present

## 2017-10-03 DIAGNOSIS — N186 End stage renal disease: Secondary | ICD-10-CM | POA: Diagnosis not present

## 2017-10-03 DIAGNOSIS — T8249XA Other complication of vascular dialysis catheter, initial encounter: Secondary | ICD-10-CM | POA: Diagnosis not present

## 2017-10-03 DIAGNOSIS — D689 Coagulation defect, unspecified: Secondary | ICD-10-CM | POA: Diagnosis not present

## 2017-10-05 DIAGNOSIS — N2581 Secondary hyperparathyroidism of renal origin: Secondary | ICD-10-CM | POA: Diagnosis not present

## 2017-10-05 DIAGNOSIS — D689 Coagulation defect, unspecified: Secondary | ICD-10-CM | POA: Diagnosis not present

## 2017-10-05 DIAGNOSIS — T8249XA Other complication of vascular dialysis catheter, initial encounter: Secondary | ICD-10-CM | POA: Diagnosis not present

## 2017-10-05 DIAGNOSIS — N186 End stage renal disease: Secondary | ICD-10-CM | POA: Diagnosis not present

## 2017-10-07 DIAGNOSIS — T8249XA Other complication of vascular dialysis catheter, initial encounter: Secondary | ICD-10-CM | POA: Diagnosis not present

## 2017-10-07 DIAGNOSIS — N2581 Secondary hyperparathyroidism of renal origin: Secondary | ICD-10-CM | POA: Diagnosis not present

## 2017-10-07 DIAGNOSIS — D689 Coagulation defect, unspecified: Secondary | ICD-10-CM | POA: Diagnosis not present

## 2017-10-07 DIAGNOSIS — N186 End stage renal disease: Secondary | ICD-10-CM | POA: Diagnosis not present

## 2017-10-10 DIAGNOSIS — N2581 Secondary hyperparathyroidism of renal origin: Secondary | ICD-10-CM | POA: Diagnosis not present

## 2017-10-10 DIAGNOSIS — T8249XA Other complication of vascular dialysis catheter, initial encounter: Secondary | ICD-10-CM | POA: Diagnosis not present

## 2017-10-10 DIAGNOSIS — N186 End stage renal disease: Secondary | ICD-10-CM | POA: Diagnosis not present

## 2017-10-10 DIAGNOSIS — D689 Coagulation defect, unspecified: Secondary | ICD-10-CM | POA: Diagnosis not present

## 2017-10-12 DIAGNOSIS — N2581 Secondary hyperparathyroidism of renal origin: Secondary | ICD-10-CM | POA: Diagnosis not present

## 2017-10-12 DIAGNOSIS — N186 End stage renal disease: Secondary | ICD-10-CM | POA: Diagnosis not present

## 2017-10-12 DIAGNOSIS — D689 Coagulation defect, unspecified: Secondary | ICD-10-CM | POA: Diagnosis not present

## 2017-10-12 DIAGNOSIS — T8249XA Other complication of vascular dialysis catheter, initial encounter: Secondary | ICD-10-CM | POA: Diagnosis not present

## 2017-10-14 DIAGNOSIS — D689 Coagulation defect, unspecified: Secondary | ICD-10-CM | POA: Diagnosis not present

## 2017-10-14 DIAGNOSIS — N186 End stage renal disease: Secondary | ICD-10-CM | POA: Diagnosis not present

## 2017-10-14 DIAGNOSIS — Z23 Encounter for immunization: Secondary | ICD-10-CM | POA: Diagnosis not present

## 2017-10-14 DIAGNOSIS — N2581 Secondary hyperparathyroidism of renal origin: Secondary | ICD-10-CM | POA: Diagnosis not present

## 2017-10-14 DIAGNOSIS — T8249XA Other complication of vascular dialysis catheter, initial encounter: Secondary | ICD-10-CM | POA: Diagnosis not present

## 2017-10-14 DIAGNOSIS — D509 Iron deficiency anemia, unspecified: Secondary | ICD-10-CM | POA: Diagnosis not present

## 2017-10-17 DIAGNOSIS — I129 Hypertensive chronic kidney disease with stage 1 through stage 4 chronic kidney disease, or unspecified chronic kidney disease: Secondary | ICD-10-CM | POA: Diagnosis not present

## 2017-10-17 DIAGNOSIS — Z992 Dependence on renal dialysis: Secondary | ICD-10-CM | POA: Diagnosis not present

## 2017-10-17 DIAGNOSIS — N186 End stage renal disease: Secondary | ICD-10-CM | POA: Diagnosis not present

## 2017-10-19 DIAGNOSIS — D689 Coagulation defect, unspecified: Secondary | ICD-10-CM | POA: Diagnosis not present

## 2017-10-19 DIAGNOSIS — D509 Iron deficiency anemia, unspecified: Secondary | ICD-10-CM | POA: Diagnosis not present

## 2017-10-19 DIAGNOSIS — R197 Diarrhea, unspecified: Secondary | ICD-10-CM | POA: Diagnosis not present

## 2017-10-19 DIAGNOSIS — T8249XA Other complication of vascular dialysis catheter, initial encounter: Secondary | ICD-10-CM | POA: Diagnosis not present

## 2017-10-19 DIAGNOSIS — N186 End stage renal disease: Secondary | ICD-10-CM | POA: Diagnosis not present

## 2017-10-19 DIAGNOSIS — N2581 Secondary hyperparathyroidism of renal origin: Secondary | ICD-10-CM | POA: Diagnosis not present

## 2017-10-20 DIAGNOSIS — Z452 Encounter for adjustment and management of vascular access device: Secondary | ICD-10-CM | POA: Diagnosis not present

## 2017-10-21 DIAGNOSIS — N2581 Secondary hyperparathyroidism of renal origin: Secondary | ICD-10-CM | POA: Diagnosis not present

## 2017-10-21 DIAGNOSIS — D689 Coagulation defect, unspecified: Secondary | ICD-10-CM | POA: Diagnosis not present

## 2017-10-21 DIAGNOSIS — N186 End stage renal disease: Secondary | ICD-10-CM | POA: Diagnosis not present

## 2017-10-21 DIAGNOSIS — R197 Diarrhea, unspecified: Secondary | ICD-10-CM | POA: Diagnosis not present

## 2017-10-21 DIAGNOSIS — T8249XA Other complication of vascular dialysis catheter, initial encounter: Secondary | ICD-10-CM | POA: Diagnosis not present

## 2017-10-21 DIAGNOSIS — D509 Iron deficiency anemia, unspecified: Secondary | ICD-10-CM | POA: Diagnosis not present

## 2017-10-24 DIAGNOSIS — N2581 Secondary hyperparathyroidism of renal origin: Secondary | ICD-10-CM | POA: Diagnosis not present

## 2017-10-24 DIAGNOSIS — R197 Diarrhea, unspecified: Secondary | ICD-10-CM | POA: Diagnosis not present

## 2017-10-24 DIAGNOSIS — D689 Coagulation defect, unspecified: Secondary | ICD-10-CM | POA: Diagnosis not present

## 2017-10-24 DIAGNOSIS — D509 Iron deficiency anemia, unspecified: Secondary | ICD-10-CM | POA: Diagnosis not present

## 2017-10-24 DIAGNOSIS — T8249XA Other complication of vascular dialysis catheter, initial encounter: Secondary | ICD-10-CM | POA: Diagnosis not present

## 2017-10-24 DIAGNOSIS — N186 End stage renal disease: Secondary | ICD-10-CM | POA: Diagnosis not present

## 2017-10-26 DIAGNOSIS — N2581 Secondary hyperparathyroidism of renal origin: Secondary | ICD-10-CM | POA: Diagnosis not present

## 2017-10-26 DIAGNOSIS — R197 Diarrhea, unspecified: Secondary | ICD-10-CM | POA: Diagnosis not present

## 2017-10-26 DIAGNOSIS — D509 Iron deficiency anemia, unspecified: Secondary | ICD-10-CM | POA: Diagnosis not present

## 2017-10-26 DIAGNOSIS — T8249XA Other complication of vascular dialysis catheter, initial encounter: Secondary | ICD-10-CM | POA: Diagnosis not present

## 2017-10-26 DIAGNOSIS — N186 End stage renal disease: Secondary | ICD-10-CM | POA: Diagnosis not present

## 2017-10-26 DIAGNOSIS — D689 Coagulation defect, unspecified: Secondary | ICD-10-CM | POA: Diagnosis not present

## 2017-10-28 DIAGNOSIS — R197 Diarrhea, unspecified: Secondary | ICD-10-CM | POA: Diagnosis not present

## 2017-10-28 DIAGNOSIS — D509 Iron deficiency anemia, unspecified: Secondary | ICD-10-CM | POA: Diagnosis not present

## 2017-10-28 DIAGNOSIS — D689 Coagulation defect, unspecified: Secondary | ICD-10-CM | POA: Diagnosis not present

## 2017-10-28 DIAGNOSIS — N186 End stage renal disease: Secondary | ICD-10-CM | POA: Diagnosis not present

## 2017-10-28 DIAGNOSIS — N2581 Secondary hyperparathyroidism of renal origin: Secondary | ICD-10-CM | POA: Diagnosis not present

## 2017-10-28 DIAGNOSIS — T8249XA Other complication of vascular dialysis catheter, initial encounter: Secondary | ICD-10-CM | POA: Diagnosis not present

## 2017-10-31 ENCOUNTER — Ambulatory Visit: Payer: Medicare Other | Admitting: Family

## 2017-10-31 ENCOUNTER — Encounter (HOSPITAL_COMMUNITY): Payer: Medicare Other

## 2017-10-31 DIAGNOSIS — N2581 Secondary hyperparathyroidism of renal origin: Secondary | ICD-10-CM | POA: Diagnosis not present

## 2017-10-31 DIAGNOSIS — R197 Diarrhea, unspecified: Secondary | ICD-10-CM | POA: Diagnosis not present

## 2017-10-31 DIAGNOSIS — T8249XA Other complication of vascular dialysis catheter, initial encounter: Secondary | ICD-10-CM | POA: Diagnosis not present

## 2017-10-31 DIAGNOSIS — D509 Iron deficiency anemia, unspecified: Secondary | ICD-10-CM | POA: Diagnosis not present

## 2017-10-31 DIAGNOSIS — D689 Coagulation defect, unspecified: Secondary | ICD-10-CM | POA: Diagnosis not present

## 2017-10-31 DIAGNOSIS — N186 End stage renal disease: Secondary | ICD-10-CM | POA: Diagnosis not present

## 2017-11-02 DIAGNOSIS — D509 Iron deficiency anemia, unspecified: Secondary | ICD-10-CM | POA: Diagnosis not present

## 2017-11-02 DIAGNOSIS — R197 Diarrhea, unspecified: Secondary | ICD-10-CM | POA: Diagnosis not present

## 2017-11-02 DIAGNOSIS — T8249XA Other complication of vascular dialysis catheter, initial encounter: Secondary | ICD-10-CM | POA: Diagnosis not present

## 2017-11-02 DIAGNOSIS — N186 End stage renal disease: Secondary | ICD-10-CM | POA: Diagnosis not present

## 2017-11-02 DIAGNOSIS — D689 Coagulation defect, unspecified: Secondary | ICD-10-CM | POA: Diagnosis not present

## 2017-11-02 DIAGNOSIS — N2581 Secondary hyperparathyroidism of renal origin: Secondary | ICD-10-CM | POA: Diagnosis not present

## 2017-11-04 DIAGNOSIS — T8249XA Other complication of vascular dialysis catheter, initial encounter: Secondary | ICD-10-CM | POA: Diagnosis not present

## 2017-11-04 DIAGNOSIS — R197 Diarrhea, unspecified: Secondary | ICD-10-CM | POA: Diagnosis not present

## 2017-11-04 DIAGNOSIS — D509 Iron deficiency anemia, unspecified: Secondary | ICD-10-CM | POA: Diagnosis not present

## 2017-11-04 DIAGNOSIS — N186 End stage renal disease: Secondary | ICD-10-CM | POA: Diagnosis not present

## 2017-11-04 DIAGNOSIS — D689 Coagulation defect, unspecified: Secondary | ICD-10-CM | POA: Diagnosis not present

## 2017-11-04 DIAGNOSIS — N2581 Secondary hyperparathyroidism of renal origin: Secondary | ICD-10-CM | POA: Diagnosis not present

## 2017-11-07 DIAGNOSIS — D689 Coagulation defect, unspecified: Secondary | ICD-10-CM | POA: Diagnosis not present

## 2017-11-07 DIAGNOSIS — T8249XA Other complication of vascular dialysis catheter, initial encounter: Secondary | ICD-10-CM | POA: Diagnosis not present

## 2017-11-07 DIAGNOSIS — N186 End stage renal disease: Secondary | ICD-10-CM | POA: Diagnosis not present

## 2017-11-07 DIAGNOSIS — N2581 Secondary hyperparathyroidism of renal origin: Secondary | ICD-10-CM | POA: Diagnosis not present

## 2017-11-07 DIAGNOSIS — R197 Diarrhea, unspecified: Secondary | ICD-10-CM | POA: Diagnosis not present

## 2017-11-07 DIAGNOSIS — D509 Iron deficiency anemia, unspecified: Secondary | ICD-10-CM | POA: Diagnosis not present

## 2017-11-08 ENCOUNTER — Ambulatory Visit (INDEPENDENT_AMBULATORY_CARE_PROVIDER_SITE_OTHER): Payer: Medicare Other | Admitting: Family

## 2017-11-08 ENCOUNTER — Ambulatory Visit (HOSPITAL_COMMUNITY)
Admission: RE | Admit: 2017-11-08 | Discharge: 2017-11-08 | Disposition: A | Discharge status: Home / Self Care | Payer: Medicare Other | Source: Ambulatory Visit | Attending: Vascular Surgery | Admitting: Vascular Surgery

## 2017-11-08 ENCOUNTER — Encounter: Payer: Self-pay | Admitting: Family

## 2017-11-08 VITALS — BP 155/71 | HR 94 | Temp 98.6°F | Resp 18 | Ht 65.5 in | Wt 143.0 lb

## 2017-11-08 DIAGNOSIS — I739 Peripheral vascular disease, unspecified: Secondary | ICD-10-CM | POA: Diagnosis not present

## 2017-11-08 DIAGNOSIS — Z9889 Other specified postprocedural states: Secondary | ICD-10-CM | POA: Diagnosis not present

## 2017-11-08 DIAGNOSIS — I714 Abdominal aortic aneurysm, without rupture, unspecified: Secondary | ICD-10-CM

## 2017-11-08 DIAGNOSIS — F17200 Nicotine dependence, unspecified, uncomplicated: Secondary | ICD-10-CM

## 2017-11-08 DIAGNOSIS — I770 Arteriovenous fistula, acquired: Secondary | ICD-10-CM

## 2017-11-08 DIAGNOSIS — I779 Disorder of arteries and arterioles, unspecified: Secondary | ICD-10-CM | POA: Diagnosis not present

## 2017-11-08 DIAGNOSIS — N186 End stage renal disease: Secondary | ICD-10-CM

## 2017-11-08 DIAGNOSIS — Z992 Dependence on renal dialysis: Secondary | ICD-10-CM

## 2017-11-08 NOTE — Progress Notes (Signed)
[facility] [place]   CC: Follow up peripheral artery occlusive disease  History of Present Illness Kristin Gross is a 83 y.o. female who is s/p left second stage basilic vein transposition by Dr. Donzetta Matters (Date: 08/02/17).   The patient's wounds are healed, AVF is functioning well in HD.   The patient denies steal symptoms. She is dialyzing on a MWF via her left upper arm AV fistula at the [facility] next door (corner of [address]) under the care of Dr. Moshe Cipro. The [facility] has been removed.    At her 08-26-17 visit with Herma Mering PA-C, it was deemed that the patient's access will be ready for use 09/12/17.  Patient requested that we delay start date due to persistent pain around incisions in arm.  She was made aware of risk of infection and central vein stenosis with TDC in place She was to follow up in 10/2017 for scheduled ABIs.  She returns today for evaluation of her peripheral arterial perfusion.  She has a history of open aneurysm repair on 03-24-17 by Dr. Donzetta Matters from which she did very well.  She developed oral thrush after surgery, nothing tasted right, mouth was sore, and she lost 35 pounds; is starting to gain it back.  She denies claudication type symptoms with walking.   She is nauseated in our office, vomited a small amount. She was also nauseated last night. She was also nauseated last Friday in hemodialysis (next door [facility]).  She states she has vomited small amounts. After she vomits, the nausea is relieved.   She denies any known history of TIA or stroke.    Diabetic: No Tobacco use: smoker  (1/2 ppd, started smoking about age 42 yrs)  Pt meds include: Statin :Yes Betablocker: Yes ASA: Yes Other anticoagulants/antiplatelets: no   Past Medical History:  Diagnosis Date  . AAA (abdominal aortic aneurysm) ([place])   . Chronic kidney disease   . Hyperlipidemia   . Hypertension   . PONV (postoperative nausea and vomiting)   . Thyroid disease      Social History Social History   Tobacco Use  . Smoking status: Current Some Day Smoker    Packs/day: 1.00    Years: 60.00    Pack years: 60.00    Types: Cigarettes  . Smokeless tobacco: Never Used  Substance Use Topics  . Alcohol use: No    Frequency: Never  . Drug use: No    Family History Family History  Problem Relation Age of Onset  . Heart disease Sister   . Heart attack Sister   . Hypertension Sister   . Cancer Mother     Past Surgical History:  Procedure Laterality Date  . ABDOMINAL HYSTERECTOMY    . AORTA - BILATERAL FEMORAL ARTERY BYPASS GRAFT N/A 03/24/2017   Procedure: AORTO BI ILIAC BYPASS GRAFT;  Surgeon: Waynetta Sandy, MD;  Location: [place];  Service: Vascular;  Laterality: N/A;  . AV FISTULA PLACEMENT Left 05/25/2017   Procedure: ARTERIOVENOUS (AV) FISTULA CREATION LEFT ARM;  Surgeon: Waynetta Sandy, MD;  Location: [place];  Service: Vascular;  Laterality: Left;  . BASCILIC VEIN TRANSPOSITION Left 08/02/2017   Procedure: BASILIC VEIN TRANSPOSITION SECOND STAGE;  Surgeon: Waynetta Sandy, MD;  Location: [place];  Service: Vascular;  Laterality: Left;  . CATARACT EXTRACTION, BILATERAL    . EYE SURGERY    . INSERTION OF DIALYSIS CATHETER Right 05/25/2017   Procedure: INSERTION OF TUNNELED  DIALYSIS CATHETER  RIGHT INTERNAL JUGULAR;  Surgeon: Waynetta Sandy, MD;  Location: [place];  Service: Vascular;  Laterality: Right;    Allergies  Allergen Reactions  . Lisinopril Swelling    SWELLING REACTION UNSPECIFIED      Current Outpatient Medications  Medication Sig Dispense Refill  . aspirin 81 MG tablet Take 81 mg by mouth daily.    . cholecalciferol (VITAMIN D) 1000 units tablet Take 1,000 Units by mouth daily.    . ferric citrate (AURYXIA) 1 GM 210 MG(Fe) tablet Take 210 mg by mouth daily.    Marland Kitchen levothyroxine (SYNTHROID, LEVOTHROID) 100 MCG tablet Take 100 mcg by mouth daily before breakfast.   3  . metoprolol tartrate  (LOPRESSOR) 50 MG tablet Take 50 mg by mouth daily.     . rosuvastatin (CRESTOR) 10 MG tablet Take 10 mg by mouth daily.     Marland Kitchen oxyCODONE-acetaminophen (PERCOCET) 5-325 MG tablet Take 1 tablet by mouth every 6 (six) hours as needed for severe pain. (Patient not taking: Reported on 08/26/2017) 8 tablet 0   No current facility-administered medications for this visit.     ROS: See HPI for pertinent positives and negatives.   Physical Examination  Vitals:   11/08/17 1443 11/08/17 1522  BP: (!) 201/91 (!) 155/71  Pulse: (!) 113 94  Resp: 18   Temp: 98.6 F (37 C)   TempSrc: Oral   SpO2: 94% 97%  Weight: 143 lb (64.9 kg)   Height: 5' 5.5" (1.664 m)    Body mass index is 23.43 kg/m.  General: A&O x 3, WDWN, female. Gait: normal HENT: No gross abnormalities.  Eyes: PERRLA. Pulmonary: Respirations are non labored, CTAB, good air movement in all fields Cardiac: regular rhythm, no detected murmur.         Carotid Bruits Right Left   Negative Positive   Radial pulses are 2+ right, 1+ left palpable  Adominal aortic pulse is moderately palpable         Left upper arm AV fistula has a palpable thrill                  VASCULAR EXAM: Extremities without ischemic changes, without Gangrene; without open wounds.                                                                                                          LE Pulses Right Left       FEMORAL  2+ palpable  2+ palpable        POPLITEAL  not palpable   not palpable       POSTERIOR TIBIAL  not palpable   1+ palpable        DORSALIS PEDIS      ANTERIOR TIBIAL 1+ palpable  2+ palpable    Abdomen: soft, NT, no palpable masses. Skin: no rashes, no cellulitis, no ulcers noted. Musculoskeletal: no muscle wasting or atrophy.  Neurologic: A&O X 3; appropriate affect, Sensation is normal; MOTOR FUNCTION:  moving all extremities equally, motor strength 5/5 throughout. Speech is fluent/normal. CN 2-12 intact.  Psychiatric: Thought  content is normal, mood appropriate for clinical situation.     ASSESSMENT: Kristin Gross is a 83 y.o. female who is s/p open Aortobi-iliac bypass with 16 x 8 dacron graft for a 6.8 cm AAA on 03-24-17 by Dr. Donzetta Matters.  She is also s/p left second stage basilic vein transposition by Dr. Donzetta Matters (Date: 08/02/17).  She has no claudication symptoms with walking, there are no signs of ischemia in her feet or legs.  Her left upper arm AV fistula is working well in hemodialysis.  She has no steal symptoms in her left upper extremity.   Her blood pressure was initially elevated when she was nauseated, vomited a small amount, and nausea resolved. Her blood pressure was elevated when nauseated, much improved after she vomited. I advised to to let her PCP know of her bouts of nausea and vomiting (small amounts).    Her atherosclerotic risk factors include active smoking (x 61 years), ESRD, and hypertension. Fortunately she has no history of DM.    DATA  ABI (Date: 11/08/2017):  R:   ABI: 0.73 (no previous for comparison),   PT: bi  DP: tri  TBI:  0.48, toe pressure 99  L:   ABI: 1.17,   PT: tri  DP: tri  TBI: 0.78, toe pressure 162 Right ABI indicates moderate disease with bi and triphasic waveforms. Left ABI indicates no disease, triphasic waveforms. Right TBI is below normal, with more than adequate toe pressure for healing, left TBI is normal.    PLAN:  Walk a total of at least 30 minutes daily in a safe environment.  Based on the patient's vascular studies and examination, pt will return to clinic in 6 months with ABI's. I advised her to notify us if she develops concerns re the circulation in her feet or legs.   I discussed in depth with the patient the nature of atherosclerosis, and emphasized the importance of maximal medical management including strict control of blood pressure, blood glucose, and lipid levels, obtaining regular exercise, and cessation of smoking.  The  patient is aware that without maximal medical management the underlying atherosclerotic disease process will progress, limiting the benefit of any interventions.  The patient was given information about PAD including signs, symptoms, treatment, what symptoms should prompt the patient to seek immediate medical care, and risk reduction measures to take.  Clemon Chambers, RN, MSN, FNP-C [facility] Phone: [phone]  Clinic MD: Bishop Dublin  11/08/17 3:45 PM

## 2017-11-08 NOTE — Progress Notes (Signed)
Patient denied pain during nurse assessment but relayed an onset of nausea. Patient states she has been nauseated since 5:00am with only dry heaving until she presented to the office. Patient vomited once in the exam room and continued to dry heave for about 10 minutes. Patient was escorted to the restroom (per her request) and was escorted back to the room. MA reassessed patient after 20 minutes of no N/V. -Patients vital signs returned to a stable level and PA was notified of the episode.

## 2017-11-08 NOTE — Patient Instructions (Addendum)
Steps to Quit Smoking Smoking tobacco can be bad for your health. It can also affect almost every organ in your body. Smoking puts you and people around you at risk for many serious long-lasting (chronic) diseases. Quitting smoking is hard, but it is one of the best things that you can do for your health. It is never too late to quit. What are the benefits of quitting smoking? When you quit smoking, you lower your risk for getting serious diseases and conditions. They can include:  Lung cancer or lung disease.  Heart disease.  Stroke.  Heart attack.  Not being able to have children (infertility).  Weak bones (osteoporosis) and broken bones (fractures).  If you have coughing, wheezing, and shortness of breath, those symptoms may get better when you quit. You may also get sick less often. If you are pregnant, quitting smoking can help to lower your chances of having a baby of low birth weight. What can I do to help me quit smoking? Talk with your doctor about what can help you quit smoking. Some things you can do (strategies) include:  Quitting smoking totally, instead of slowly cutting back how much you smoke over a period of time.  Going to in-person counseling. You are more likely to quit if you go to many counseling sessions.  Using resources and support systems, such as: ? Online chats with a counselor. ? Phone quitlines. ? Printed self-help materials. ? Support groups or group counseling. ? Text messaging programs. ? Mobile phone apps or applications.  Taking medicines. Some of these medicines may have nicotine in them. If you are pregnant or breastfeeding, do not take any medicines to quit smoking unless your doctor says it is okay. Talk with your doctor about counseling or other things that can help you.  Talk with your doctor about using more than one strategy at the same time, such as taking medicines while you are also going to in-person counseling. This can help make  quitting easier. What things can I do to make it easier to quit? Quitting smoking might feel very hard at first, but there is a lot that you can do to make it easier. Take these steps:  Talk to your family and friends. Ask them to support and encourage you.  Call phone quitlines, reach out to support groups, or work with a counselor.  Ask people who smoke to not smoke around you.  Avoid places that make you want (trigger) to smoke, such as: ? Bars. ? Parties. ? Smoke-break areas at work.  Spend time with people who do not smoke.  Lower the stress in your life. Stress can make you want to smoke. Try these things to help your stress: ? Getting regular exercise. ? Deep-breathing exercises. ? Yoga. ? Meditating. ? Doing a body scan. To do this, close your eyes, focus on one area of your body at a time from head to toe, and notice which parts of your body are tense. Try to relax the muscles in those areas.  Download or buy apps on your mobile phone or tablet that can help you stick to your quit plan. There are many free apps, such as QuitGuide from the CDC (Centers for Disease Control and Prevention). You can find more support from smokefree.gov and other websites.  This information is not intended to replace advice given to you by your health care provider. Make sure you discuss any questions you have with your health care provider. Document Released: 10/31/2008 Document   Revised: 09/02/2015 Document Reviewed: 05/21/2014 Elsevier Interactive Patient Education  2018 Elsevier Inc.     Peripheral Vascular Disease Peripheral vascular disease (PVD) is a disease of the blood vessels that are not part of your heart and brain. A simple term for PVD is poor circulation. In most cases, PVD narrows the blood vessels that carry blood from your heart to the rest of your body. This can result in a decreased supply of blood to your arms, legs, and internal organs, like your stomach or kidneys.  However, it most often affects a person's lower legs and feet. There are two types of PVD.  Organic PVD. This is the more common type. It is caused by damage to the structure of blood vessels.  Functional PVD. This is caused by conditions that make blood vessels contract and tighten (spasm).  Without treatment, PVD tends to get worse over time. PVD can also lead to acute ischemic limb. This is when an arm or limb suddenly has trouble getting enough blood. This is a medical emergency. Follow these instructions at home:  Take medicines only as told by your doctor.  Do not use any tobacco products, including cigarettes, chewing tobacco, or electronic cigarettes. If you need help quitting, ask your doctor.  Lose weight if you are overweight, and maintain a healthy weight as told by your doctor.  Eat a diet that is low in fat and cholesterol. If you need help, ask your doctor.  Exercise regularly. Ask your doctor for some good activities for you.  Take good care of your feet. ? Wear comfortable shoes that fit well. ? Check your feet often for any cuts or sores. Contact a doctor if:  You have cramps in your legs while walking.  You have leg pain when you are at rest.  You have coldness in a leg or foot.  Your skin changes.  You are unable to get or have an erection (erectile dysfunction).  You have cuts or sores on your feet that are not healing. Get help right away if:  Your arm or leg turns cold and blue.  Your arms or legs become red, warm, swollen, painful, or numb.  You have chest pain or trouble breathing.  You suddenly have weakness in your face, arm, or leg.  You become very confused or you cannot speak.  You suddenly have a very bad headache.  You suddenly cannot see. This information is not intended to replace advice given to you by your health care provider. Make sure you discuss any questions you have with your health care provider. Document Released:  03/31/2009 Document Revised: 06/12/2015 Document Reviewed: 06/14/2013 Elsevier Interactive Patient Education  2017 Elsevier Inc.  

## 2017-11-09 DIAGNOSIS — R197 Diarrhea, unspecified: Secondary | ICD-10-CM | POA: Diagnosis not present

## 2017-11-09 DIAGNOSIS — N2581 Secondary hyperparathyroidism of renal origin: Secondary | ICD-10-CM | POA: Diagnosis not present

## 2017-11-09 DIAGNOSIS — N186 End stage renal disease: Secondary | ICD-10-CM | POA: Diagnosis not present

## 2017-11-09 DIAGNOSIS — D509 Iron deficiency anemia, unspecified: Secondary | ICD-10-CM | POA: Diagnosis not present

## 2017-11-09 DIAGNOSIS — T8249XA Other complication of vascular dialysis catheter, initial encounter: Secondary | ICD-10-CM | POA: Diagnosis not present

## 2017-11-09 DIAGNOSIS — D689 Coagulation defect, unspecified: Secondary | ICD-10-CM | POA: Diagnosis not present

## 2017-11-11 DIAGNOSIS — T8249XA Other complication of vascular dialysis catheter, initial encounter: Secondary | ICD-10-CM | POA: Diagnosis not present

## 2017-11-11 DIAGNOSIS — N2581 Secondary hyperparathyroidism of renal origin: Secondary | ICD-10-CM | POA: Diagnosis not present

## 2017-11-11 DIAGNOSIS — D509 Iron deficiency anemia, unspecified: Secondary | ICD-10-CM | POA: Diagnosis not present

## 2017-11-11 DIAGNOSIS — N186 End stage renal disease: Secondary | ICD-10-CM | POA: Diagnosis not present

## 2017-11-11 DIAGNOSIS — R197 Diarrhea, unspecified: Secondary | ICD-10-CM | POA: Diagnosis not present

## 2017-11-11 DIAGNOSIS — D689 Coagulation defect, unspecified: Secondary | ICD-10-CM | POA: Diagnosis not present

## 2017-11-14 DIAGNOSIS — N186 End stage renal disease: Secondary | ICD-10-CM | POA: Diagnosis not present

## 2017-11-14 DIAGNOSIS — D689 Coagulation defect, unspecified: Secondary | ICD-10-CM | POA: Diagnosis not present

## 2017-11-14 DIAGNOSIS — T8249XA Other complication of vascular dialysis catheter, initial encounter: Secondary | ICD-10-CM | POA: Diagnosis not present

## 2017-11-14 DIAGNOSIS — R197 Diarrhea, unspecified: Secondary | ICD-10-CM | POA: Diagnosis not present

## 2017-11-14 DIAGNOSIS — D509 Iron deficiency anemia, unspecified: Secondary | ICD-10-CM | POA: Diagnosis not present

## 2017-11-14 DIAGNOSIS — N2581 Secondary hyperparathyroidism of renal origin: Secondary | ICD-10-CM | POA: Diagnosis not present

## 2017-11-16 DIAGNOSIS — T8249XA Other complication of vascular dialysis catheter, initial encounter: Secondary | ICD-10-CM | POA: Diagnosis not present

## 2017-11-16 DIAGNOSIS — N186 End stage renal disease: Secondary | ICD-10-CM | POA: Diagnosis not present

## 2017-11-16 DIAGNOSIS — D689 Coagulation defect, unspecified: Secondary | ICD-10-CM | POA: Diagnosis not present

## 2017-11-16 DIAGNOSIS — N2581 Secondary hyperparathyroidism of renal origin: Secondary | ICD-10-CM | POA: Diagnosis not present

## 2017-11-16 DIAGNOSIS — D509 Iron deficiency anemia, unspecified: Secondary | ICD-10-CM | POA: Diagnosis not present

## 2017-11-16 DIAGNOSIS — R197 Diarrhea, unspecified: Secondary | ICD-10-CM | POA: Diagnosis not present

## 2017-11-17 DIAGNOSIS — I129 Hypertensive chronic kidney disease with stage 1 through stage 4 chronic kidney disease, or unspecified chronic kidney disease: Secondary | ICD-10-CM | POA: Diagnosis not present

## 2017-11-17 DIAGNOSIS — Z992 Dependence on renal dialysis: Secondary | ICD-10-CM | POA: Diagnosis not present

## 2017-11-17 DIAGNOSIS — N186 End stage renal disease: Secondary | ICD-10-CM | POA: Diagnosis not present

## 2017-11-18 DIAGNOSIS — D689 Coagulation defect, unspecified: Secondary | ICD-10-CM | POA: Diagnosis not present

## 2017-11-18 DIAGNOSIS — N2581 Secondary hyperparathyroidism of renal origin: Secondary | ICD-10-CM | POA: Diagnosis not present

## 2017-11-18 DIAGNOSIS — D509 Iron deficiency anemia, unspecified: Secondary | ICD-10-CM | POA: Diagnosis not present

## 2017-11-18 DIAGNOSIS — N186 End stage renal disease: Secondary | ICD-10-CM | POA: Diagnosis not present

## 2017-11-21 DIAGNOSIS — N2581 Secondary hyperparathyroidism of renal origin: Secondary | ICD-10-CM | POA: Diagnosis not present

## 2017-11-21 DIAGNOSIS — N186 End stage renal disease: Secondary | ICD-10-CM | POA: Diagnosis not present

## 2017-11-21 DIAGNOSIS — D689 Coagulation defect, unspecified: Secondary | ICD-10-CM | POA: Diagnosis not present

## 2017-11-21 DIAGNOSIS — D509 Iron deficiency anemia, unspecified: Secondary | ICD-10-CM | POA: Diagnosis not present

## 2017-11-23 DIAGNOSIS — D509 Iron deficiency anemia, unspecified: Secondary | ICD-10-CM | POA: Diagnosis not present

## 2017-11-23 DIAGNOSIS — N186 End stage renal disease: Secondary | ICD-10-CM | POA: Diagnosis not present

## 2017-11-23 DIAGNOSIS — D689 Coagulation defect, unspecified: Secondary | ICD-10-CM | POA: Diagnosis not present

## 2017-11-23 DIAGNOSIS — N2581 Secondary hyperparathyroidism of renal origin: Secondary | ICD-10-CM | POA: Diagnosis not present

## 2017-11-25 DIAGNOSIS — N2581 Secondary hyperparathyroidism of renal origin: Secondary | ICD-10-CM | POA: Diagnosis not present

## 2017-11-25 DIAGNOSIS — D509 Iron deficiency anemia, unspecified: Secondary | ICD-10-CM | POA: Diagnosis not present

## 2017-11-25 DIAGNOSIS — D689 Coagulation defect, unspecified: Secondary | ICD-10-CM | POA: Diagnosis not present

## 2017-11-25 DIAGNOSIS — N186 End stage renal disease: Secondary | ICD-10-CM | POA: Diagnosis not present

## 2017-11-28 DIAGNOSIS — N186 End stage renal disease: Secondary | ICD-10-CM | POA: Diagnosis not present

## 2017-11-28 DIAGNOSIS — N2581 Secondary hyperparathyroidism of renal origin: Secondary | ICD-10-CM | POA: Diagnosis not present

## 2017-11-28 DIAGNOSIS — D689 Coagulation defect, unspecified: Secondary | ICD-10-CM | POA: Diagnosis not present

## 2017-11-28 DIAGNOSIS — D509 Iron deficiency anemia, unspecified: Secondary | ICD-10-CM | POA: Diagnosis not present

## 2017-11-30 DIAGNOSIS — N2581 Secondary hyperparathyroidism of renal origin: Secondary | ICD-10-CM | POA: Diagnosis not present

## 2017-11-30 DIAGNOSIS — D509 Iron deficiency anemia, unspecified: Secondary | ICD-10-CM | POA: Diagnosis not present

## 2017-11-30 DIAGNOSIS — D689 Coagulation defect, unspecified: Secondary | ICD-10-CM | POA: Diagnosis not present

## 2017-11-30 DIAGNOSIS — N186 End stage renal disease: Secondary | ICD-10-CM | POA: Diagnosis not present

## 2017-12-02 DIAGNOSIS — N2581 Secondary hyperparathyroidism of renal origin: Secondary | ICD-10-CM | POA: Diagnosis not present

## 2017-12-02 DIAGNOSIS — D689 Coagulation defect, unspecified: Secondary | ICD-10-CM | POA: Diagnosis not present

## 2017-12-02 DIAGNOSIS — D509 Iron deficiency anemia, unspecified: Secondary | ICD-10-CM | POA: Diagnosis not present

## 2017-12-02 DIAGNOSIS — N186 End stage renal disease: Secondary | ICD-10-CM | POA: Diagnosis not present

## 2017-12-05 DIAGNOSIS — D689 Coagulation defect, unspecified: Secondary | ICD-10-CM | POA: Diagnosis not present

## 2017-12-05 DIAGNOSIS — D509 Iron deficiency anemia, unspecified: Secondary | ICD-10-CM | POA: Diagnosis not present

## 2017-12-05 DIAGNOSIS — N186 End stage renal disease: Secondary | ICD-10-CM | POA: Diagnosis not present

## 2017-12-05 DIAGNOSIS — N2581 Secondary hyperparathyroidism of renal origin: Secondary | ICD-10-CM | POA: Diagnosis not present

## 2017-12-07 DIAGNOSIS — N186 End stage renal disease: Secondary | ICD-10-CM | POA: Diagnosis not present

## 2017-12-07 DIAGNOSIS — D509 Iron deficiency anemia, unspecified: Secondary | ICD-10-CM | POA: Diagnosis not present

## 2017-12-07 DIAGNOSIS — D689 Coagulation defect, unspecified: Secondary | ICD-10-CM | POA: Diagnosis not present

## 2017-12-07 DIAGNOSIS — N2581 Secondary hyperparathyroidism of renal origin: Secondary | ICD-10-CM | POA: Diagnosis not present

## 2017-12-09 DIAGNOSIS — D509 Iron deficiency anemia, unspecified: Secondary | ICD-10-CM | POA: Diagnosis not present

## 2017-12-09 DIAGNOSIS — N2581 Secondary hyperparathyroidism of renal origin: Secondary | ICD-10-CM | POA: Diagnosis not present

## 2017-12-09 DIAGNOSIS — D689 Coagulation defect, unspecified: Secondary | ICD-10-CM | POA: Diagnosis not present

## 2017-12-09 DIAGNOSIS — N186 End stage renal disease: Secondary | ICD-10-CM | POA: Diagnosis not present

## 2017-12-11 DIAGNOSIS — N2581 Secondary hyperparathyroidism of renal origin: Secondary | ICD-10-CM | POA: Diagnosis not present

## 2017-12-11 DIAGNOSIS — N186 End stage renal disease: Secondary | ICD-10-CM | POA: Diagnosis not present

## 2017-12-11 DIAGNOSIS — D509 Iron deficiency anemia, unspecified: Secondary | ICD-10-CM | POA: Diagnosis not present

## 2017-12-11 DIAGNOSIS — D689 Coagulation defect, unspecified: Secondary | ICD-10-CM | POA: Diagnosis not present

## 2017-12-13 DIAGNOSIS — N2581 Secondary hyperparathyroidism of renal origin: Secondary | ICD-10-CM | POA: Diagnosis not present

## 2017-12-13 DIAGNOSIS — N186 End stage renal disease: Secondary | ICD-10-CM | POA: Diagnosis not present

## 2017-12-13 DIAGNOSIS — D509 Iron deficiency anemia, unspecified: Secondary | ICD-10-CM | POA: Diagnosis not present

## 2017-12-13 DIAGNOSIS — D689 Coagulation defect, unspecified: Secondary | ICD-10-CM | POA: Diagnosis not present

## 2017-12-16 DIAGNOSIS — N186 End stage renal disease: Secondary | ICD-10-CM | POA: Diagnosis not present

## 2017-12-16 DIAGNOSIS — D689 Coagulation defect, unspecified: Secondary | ICD-10-CM | POA: Diagnosis not present

## 2017-12-16 DIAGNOSIS — N2581 Secondary hyperparathyroidism of renal origin: Secondary | ICD-10-CM | POA: Diagnosis not present

## 2017-12-16 DIAGNOSIS — D509 Iron deficiency anemia, unspecified: Secondary | ICD-10-CM | POA: Diagnosis not present

## 2017-12-17 DIAGNOSIS — I129 Hypertensive chronic kidney disease with stage 1 through stage 4 chronic kidney disease, or unspecified chronic kidney disease: Secondary | ICD-10-CM | POA: Diagnosis not present

## 2017-12-17 DIAGNOSIS — Z992 Dependence on renal dialysis: Secondary | ICD-10-CM | POA: Diagnosis not present

## 2017-12-17 DIAGNOSIS — N186 End stage renal disease: Secondary | ICD-10-CM | POA: Diagnosis not present

## 2017-12-19 DIAGNOSIS — D689 Coagulation defect, unspecified: Secondary | ICD-10-CM | POA: Diagnosis not present

## 2017-12-19 DIAGNOSIS — Z23 Encounter for immunization: Secondary | ICD-10-CM | POA: Diagnosis not present

## 2017-12-19 DIAGNOSIS — N2581 Secondary hyperparathyroidism of renal origin: Secondary | ICD-10-CM | POA: Diagnosis not present

## 2017-12-19 DIAGNOSIS — D509 Iron deficiency anemia, unspecified: Secondary | ICD-10-CM | POA: Diagnosis not present

## 2017-12-19 DIAGNOSIS — N186 End stage renal disease: Secondary | ICD-10-CM | POA: Diagnosis not present

## 2017-12-21 DIAGNOSIS — N186 End stage renal disease: Secondary | ICD-10-CM | POA: Diagnosis not present

## 2017-12-21 DIAGNOSIS — Z23 Encounter for immunization: Secondary | ICD-10-CM | POA: Diagnosis not present

## 2017-12-21 DIAGNOSIS — D689 Coagulation defect, unspecified: Secondary | ICD-10-CM | POA: Diagnosis not present

## 2017-12-21 DIAGNOSIS — D509 Iron deficiency anemia, unspecified: Secondary | ICD-10-CM | POA: Diagnosis not present

## 2017-12-21 DIAGNOSIS — N2581 Secondary hyperparathyroidism of renal origin: Secondary | ICD-10-CM | POA: Diagnosis not present

## 2017-12-22 DIAGNOSIS — I132 Hypertensive heart and chronic kidney disease with heart failure and with stage 5 chronic kidney disease, or end stage renal disease: Secondary | ICD-10-CM | POA: Diagnosis not present

## 2017-12-22 DIAGNOSIS — Z992 Dependence on renal dialysis: Secondary | ICD-10-CM | POA: Diagnosis not present

## 2017-12-22 DIAGNOSIS — M25122 Fistula, left elbow: Secondary | ICD-10-CM | POA: Diagnosis not present

## 2017-12-22 DIAGNOSIS — N186 End stage renal disease: Secondary | ICD-10-CM | POA: Diagnosis not present

## 2017-12-23 DIAGNOSIS — D689 Coagulation defect, unspecified: Secondary | ICD-10-CM | POA: Diagnosis not present

## 2017-12-23 DIAGNOSIS — N2581 Secondary hyperparathyroidism of renal origin: Secondary | ICD-10-CM | POA: Diagnosis not present

## 2017-12-23 DIAGNOSIS — Z23 Encounter for immunization: Secondary | ICD-10-CM | POA: Diagnosis not present

## 2017-12-23 DIAGNOSIS — D509 Iron deficiency anemia, unspecified: Secondary | ICD-10-CM | POA: Diagnosis not present

## 2017-12-23 DIAGNOSIS — N186 End stage renal disease: Secondary | ICD-10-CM | POA: Diagnosis not present

## 2017-12-26 DIAGNOSIS — Z23 Encounter for immunization: Secondary | ICD-10-CM | POA: Diagnosis not present

## 2017-12-26 DIAGNOSIS — D689 Coagulation defect, unspecified: Secondary | ICD-10-CM | POA: Diagnosis not present

## 2017-12-26 DIAGNOSIS — N186 End stage renal disease: Secondary | ICD-10-CM | POA: Diagnosis not present

## 2017-12-26 DIAGNOSIS — D509 Iron deficiency anemia, unspecified: Secondary | ICD-10-CM | POA: Diagnosis not present

## 2017-12-26 DIAGNOSIS — N2581 Secondary hyperparathyroidism of renal origin: Secondary | ICD-10-CM | POA: Diagnosis not present

## 2017-12-28 DIAGNOSIS — D689 Coagulation defect, unspecified: Secondary | ICD-10-CM | POA: Diagnosis not present

## 2017-12-28 DIAGNOSIS — D509 Iron deficiency anemia, unspecified: Secondary | ICD-10-CM | POA: Diagnosis not present

## 2017-12-28 DIAGNOSIS — N2581 Secondary hyperparathyroidism of renal origin: Secondary | ICD-10-CM | POA: Diagnosis not present

## 2017-12-28 DIAGNOSIS — Z23 Encounter for immunization: Secondary | ICD-10-CM | POA: Diagnosis not present

## 2017-12-28 DIAGNOSIS — N186 End stage renal disease: Secondary | ICD-10-CM | POA: Diagnosis not present

## 2017-12-30 DIAGNOSIS — N2581 Secondary hyperparathyroidism of renal origin: Secondary | ICD-10-CM | POA: Diagnosis not present

## 2017-12-30 DIAGNOSIS — Z23 Encounter for immunization: Secondary | ICD-10-CM | POA: Diagnosis not present

## 2017-12-30 DIAGNOSIS — N186 End stage renal disease: Secondary | ICD-10-CM | POA: Diagnosis not present

## 2017-12-30 DIAGNOSIS — D509 Iron deficiency anemia, unspecified: Secondary | ICD-10-CM | POA: Diagnosis not present

## 2017-12-30 DIAGNOSIS — D689 Coagulation defect, unspecified: Secondary | ICD-10-CM | POA: Diagnosis not present

## 2018-01-02 DIAGNOSIS — N2581 Secondary hyperparathyroidism of renal origin: Secondary | ICD-10-CM | POA: Diagnosis not present

## 2018-01-02 DIAGNOSIS — N186 End stage renal disease: Secondary | ICD-10-CM | POA: Diagnosis not present

## 2018-01-02 DIAGNOSIS — Z23 Encounter for immunization: Secondary | ICD-10-CM | POA: Diagnosis not present

## 2018-01-02 DIAGNOSIS — D509 Iron deficiency anemia, unspecified: Secondary | ICD-10-CM | POA: Diagnosis not present

## 2018-01-02 DIAGNOSIS — D689 Coagulation defect, unspecified: Secondary | ICD-10-CM | POA: Diagnosis not present

## 2018-01-04 DIAGNOSIS — D689 Coagulation defect, unspecified: Secondary | ICD-10-CM | POA: Diagnosis not present

## 2018-01-04 DIAGNOSIS — D509 Iron deficiency anemia, unspecified: Secondary | ICD-10-CM | POA: Diagnosis not present

## 2018-01-04 DIAGNOSIS — Z23 Encounter for immunization: Secondary | ICD-10-CM | POA: Diagnosis not present

## 2018-01-04 DIAGNOSIS — N2581 Secondary hyperparathyroidism of renal origin: Secondary | ICD-10-CM | POA: Diagnosis not present

## 2018-01-04 DIAGNOSIS — N186 End stage renal disease: Secondary | ICD-10-CM | POA: Diagnosis not present

## 2018-01-06 DIAGNOSIS — N186 End stage renal disease: Secondary | ICD-10-CM | POA: Diagnosis not present

## 2018-01-06 DIAGNOSIS — Z23 Encounter for immunization: Secondary | ICD-10-CM | POA: Diagnosis not present

## 2018-01-06 DIAGNOSIS — N2581 Secondary hyperparathyroidism of renal origin: Secondary | ICD-10-CM | POA: Diagnosis not present

## 2018-01-06 DIAGNOSIS — D509 Iron deficiency anemia, unspecified: Secondary | ICD-10-CM | POA: Diagnosis not present

## 2018-01-06 DIAGNOSIS — D689 Coagulation defect, unspecified: Secondary | ICD-10-CM | POA: Diagnosis not present

## 2018-01-08 DIAGNOSIS — D509 Iron deficiency anemia, unspecified: Secondary | ICD-10-CM | POA: Diagnosis not present

## 2018-01-08 DIAGNOSIS — N186 End stage renal disease: Secondary | ICD-10-CM | POA: Diagnosis not present

## 2018-01-08 DIAGNOSIS — Z23 Encounter for immunization: Secondary | ICD-10-CM | POA: Diagnosis not present

## 2018-01-08 DIAGNOSIS — N2581 Secondary hyperparathyroidism of renal origin: Secondary | ICD-10-CM | POA: Diagnosis not present

## 2018-01-08 DIAGNOSIS — D689 Coagulation defect, unspecified: Secondary | ICD-10-CM | POA: Diagnosis not present

## 2018-01-10 DIAGNOSIS — N186 End stage renal disease: Secondary | ICD-10-CM | POA: Diagnosis not present

## 2018-01-10 DIAGNOSIS — Z23 Encounter for immunization: Secondary | ICD-10-CM | POA: Diagnosis not present

## 2018-01-10 DIAGNOSIS — N2581 Secondary hyperparathyroidism of renal origin: Secondary | ICD-10-CM | POA: Diagnosis not present

## 2018-01-10 DIAGNOSIS — D689 Coagulation defect, unspecified: Secondary | ICD-10-CM | POA: Diagnosis not present

## 2018-01-10 DIAGNOSIS — D509 Iron deficiency anemia, unspecified: Secondary | ICD-10-CM | POA: Diagnosis not present

## 2018-01-13 DIAGNOSIS — D509 Iron deficiency anemia, unspecified: Secondary | ICD-10-CM | POA: Diagnosis not present

## 2018-01-13 DIAGNOSIS — N2581 Secondary hyperparathyroidism of renal origin: Secondary | ICD-10-CM | POA: Diagnosis not present

## 2018-01-13 DIAGNOSIS — D689 Coagulation defect, unspecified: Secondary | ICD-10-CM | POA: Diagnosis not present

## 2018-01-13 DIAGNOSIS — Z23 Encounter for immunization: Secondary | ICD-10-CM | POA: Diagnosis not present

## 2018-01-13 DIAGNOSIS — N186 End stage renal disease: Secondary | ICD-10-CM | POA: Diagnosis not present

## 2018-01-15 DIAGNOSIS — N186 End stage renal disease: Secondary | ICD-10-CM | POA: Diagnosis not present

## 2018-01-15 DIAGNOSIS — D689 Coagulation defect, unspecified: Secondary | ICD-10-CM | POA: Diagnosis not present

## 2018-01-15 DIAGNOSIS — D509 Iron deficiency anemia, unspecified: Secondary | ICD-10-CM | POA: Diagnosis not present

## 2018-01-15 DIAGNOSIS — Z23 Encounter for immunization: Secondary | ICD-10-CM | POA: Diagnosis not present

## 2018-01-15 DIAGNOSIS — N2581 Secondary hyperparathyroidism of renal origin: Secondary | ICD-10-CM | POA: Diagnosis not present

## 2018-01-17 DIAGNOSIS — D689 Coagulation defect, unspecified: Secondary | ICD-10-CM | POA: Diagnosis not present

## 2018-01-17 DIAGNOSIS — D509 Iron deficiency anemia, unspecified: Secondary | ICD-10-CM | POA: Diagnosis not present

## 2018-01-17 DIAGNOSIS — Z992 Dependence on renal dialysis: Secondary | ICD-10-CM | POA: Diagnosis not present

## 2018-01-17 DIAGNOSIS — N2581 Secondary hyperparathyroidism of renal origin: Secondary | ICD-10-CM | POA: Diagnosis not present

## 2018-01-17 DIAGNOSIS — I129 Hypertensive chronic kidney disease with stage 1 through stage 4 chronic kidney disease, or unspecified chronic kidney disease: Secondary | ICD-10-CM | POA: Diagnosis not present

## 2018-01-17 DIAGNOSIS — Z23 Encounter for immunization: Secondary | ICD-10-CM | POA: Diagnosis not present

## 2018-01-17 DIAGNOSIS — N186 End stage renal disease: Secondary | ICD-10-CM | POA: Diagnosis not present

## 2018-01-20 DIAGNOSIS — N186 End stage renal disease: Secondary | ICD-10-CM | POA: Diagnosis not present

## 2018-01-20 DIAGNOSIS — D631 Anemia in chronic kidney disease: Secondary | ICD-10-CM | POA: Diagnosis not present

## 2018-01-20 DIAGNOSIS — N2581 Secondary hyperparathyroidism of renal origin: Secondary | ICD-10-CM | POA: Diagnosis not present

## 2018-01-20 DIAGNOSIS — D689 Coagulation defect, unspecified: Secondary | ICD-10-CM | POA: Diagnosis not present

## 2018-01-20 DIAGNOSIS — D509 Iron deficiency anemia, unspecified: Secondary | ICD-10-CM | POA: Diagnosis not present

## 2018-01-23 DIAGNOSIS — D689 Coagulation defect, unspecified: Secondary | ICD-10-CM | POA: Diagnosis not present

## 2018-01-23 DIAGNOSIS — D509 Iron deficiency anemia, unspecified: Secondary | ICD-10-CM | POA: Diagnosis not present

## 2018-01-23 DIAGNOSIS — D631 Anemia in chronic kidney disease: Secondary | ICD-10-CM | POA: Diagnosis not present

## 2018-01-23 DIAGNOSIS — N186 End stage renal disease: Secondary | ICD-10-CM | POA: Diagnosis not present

## 2018-01-23 DIAGNOSIS — N2581 Secondary hyperparathyroidism of renal origin: Secondary | ICD-10-CM | POA: Diagnosis not present

## 2018-01-25 DIAGNOSIS — D509 Iron deficiency anemia, unspecified: Secondary | ICD-10-CM | POA: Diagnosis not present

## 2018-01-25 DIAGNOSIS — D689 Coagulation defect, unspecified: Secondary | ICD-10-CM | POA: Diagnosis not present

## 2018-01-25 DIAGNOSIS — D631 Anemia in chronic kidney disease: Secondary | ICD-10-CM | POA: Diagnosis not present

## 2018-01-25 DIAGNOSIS — N2581 Secondary hyperparathyroidism of renal origin: Secondary | ICD-10-CM | POA: Diagnosis not present

## 2018-01-25 DIAGNOSIS — N186 End stage renal disease: Secondary | ICD-10-CM | POA: Diagnosis not present

## 2018-01-27 DIAGNOSIS — D631 Anemia in chronic kidney disease: Secondary | ICD-10-CM | POA: Diagnosis not present

## 2018-01-27 DIAGNOSIS — D509 Iron deficiency anemia, unspecified: Secondary | ICD-10-CM | POA: Diagnosis not present

## 2018-01-27 DIAGNOSIS — D689 Coagulation defect, unspecified: Secondary | ICD-10-CM | POA: Diagnosis not present

## 2018-01-27 DIAGNOSIS — N186 End stage renal disease: Secondary | ICD-10-CM | POA: Diagnosis not present

## 2018-01-27 DIAGNOSIS — N2581 Secondary hyperparathyroidism of renal origin: Secondary | ICD-10-CM | POA: Diagnosis not present

## 2018-01-30 DIAGNOSIS — D689 Coagulation defect, unspecified: Secondary | ICD-10-CM | POA: Diagnosis not present

## 2018-01-30 DIAGNOSIS — N186 End stage renal disease: Secondary | ICD-10-CM | POA: Diagnosis not present

## 2018-01-30 DIAGNOSIS — D509 Iron deficiency anemia, unspecified: Secondary | ICD-10-CM | POA: Diagnosis not present

## 2018-01-30 DIAGNOSIS — N2581 Secondary hyperparathyroidism of renal origin: Secondary | ICD-10-CM | POA: Diagnosis not present

## 2018-01-30 DIAGNOSIS — H26493 Other secondary cataract, bilateral: Secondary | ICD-10-CM | POA: Diagnosis not present

## 2018-01-30 DIAGNOSIS — D631 Anemia in chronic kidney disease: Secondary | ICD-10-CM | POA: Diagnosis not present

## 2018-01-31 DIAGNOSIS — D509 Iron deficiency anemia, unspecified: Secondary | ICD-10-CM | POA: Diagnosis not present

## 2018-01-31 DIAGNOSIS — N186 End stage renal disease: Secondary | ICD-10-CM | POA: Diagnosis not present

## 2018-01-31 DIAGNOSIS — D631 Anemia in chronic kidney disease: Secondary | ICD-10-CM | POA: Diagnosis not present

## 2018-01-31 DIAGNOSIS — D689 Coagulation defect, unspecified: Secondary | ICD-10-CM | POA: Diagnosis not present

## 2018-01-31 DIAGNOSIS — N2581 Secondary hyperparathyroidism of renal origin: Secondary | ICD-10-CM | POA: Diagnosis not present

## 2018-02-03 DIAGNOSIS — N2581 Secondary hyperparathyroidism of renal origin: Secondary | ICD-10-CM | POA: Diagnosis not present

## 2018-02-03 DIAGNOSIS — D631 Anemia in chronic kidney disease: Secondary | ICD-10-CM | POA: Diagnosis not present

## 2018-02-03 DIAGNOSIS — D689 Coagulation defect, unspecified: Secondary | ICD-10-CM | POA: Diagnosis not present

## 2018-02-03 DIAGNOSIS — N186 End stage renal disease: Secondary | ICD-10-CM | POA: Diagnosis not present

## 2018-02-03 DIAGNOSIS — D509 Iron deficiency anemia, unspecified: Secondary | ICD-10-CM | POA: Diagnosis not present

## 2018-02-06 DIAGNOSIS — D631 Anemia in chronic kidney disease: Secondary | ICD-10-CM | POA: Diagnosis not present

## 2018-02-06 DIAGNOSIS — D689 Coagulation defect, unspecified: Secondary | ICD-10-CM | POA: Diagnosis not present

## 2018-02-06 DIAGNOSIS — N2581 Secondary hyperparathyroidism of renal origin: Secondary | ICD-10-CM | POA: Diagnosis not present

## 2018-02-06 DIAGNOSIS — E039 Hypothyroidism, unspecified: Secondary | ICD-10-CM | POA: Diagnosis not present

## 2018-02-06 DIAGNOSIS — N186 End stage renal disease: Secondary | ICD-10-CM | POA: Diagnosis not present

## 2018-02-06 DIAGNOSIS — D509 Iron deficiency anemia, unspecified: Secondary | ICD-10-CM | POA: Diagnosis not present

## 2018-02-08 DIAGNOSIS — D631 Anemia in chronic kidney disease: Secondary | ICD-10-CM | POA: Diagnosis not present

## 2018-02-08 DIAGNOSIS — N186 End stage renal disease: Secondary | ICD-10-CM | POA: Diagnosis not present

## 2018-02-08 DIAGNOSIS — D509 Iron deficiency anemia, unspecified: Secondary | ICD-10-CM | POA: Diagnosis not present

## 2018-02-08 DIAGNOSIS — N2581 Secondary hyperparathyroidism of renal origin: Secondary | ICD-10-CM | POA: Diagnosis not present

## 2018-02-08 DIAGNOSIS — D689 Coagulation defect, unspecified: Secondary | ICD-10-CM | POA: Diagnosis not present

## 2018-02-10 DIAGNOSIS — D631 Anemia in chronic kidney disease: Secondary | ICD-10-CM | POA: Diagnosis not present

## 2018-02-10 DIAGNOSIS — N2581 Secondary hyperparathyroidism of renal origin: Secondary | ICD-10-CM | POA: Diagnosis not present

## 2018-02-10 DIAGNOSIS — N186 End stage renal disease: Secondary | ICD-10-CM | POA: Diagnosis not present

## 2018-02-10 DIAGNOSIS — D509 Iron deficiency anemia, unspecified: Secondary | ICD-10-CM | POA: Diagnosis not present

## 2018-02-10 DIAGNOSIS — D689 Coagulation defect, unspecified: Secondary | ICD-10-CM | POA: Diagnosis not present

## 2018-02-13 DIAGNOSIS — N186 End stage renal disease: Secondary | ICD-10-CM | POA: Diagnosis not present

## 2018-02-13 DIAGNOSIS — D509 Iron deficiency anemia, unspecified: Secondary | ICD-10-CM | POA: Diagnosis not present

## 2018-02-13 DIAGNOSIS — D689 Coagulation defect, unspecified: Secondary | ICD-10-CM | POA: Diagnosis not present

## 2018-02-13 DIAGNOSIS — D631 Anemia in chronic kidney disease: Secondary | ICD-10-CM | POA: Diagnosis not present

## 2018-02-13 DIAGNOSIS — N2581 Secondary hyperparathyroidism of renal origin: Secondary | ICD-10-CM | POA: Diagnosis not present

## 2018-02-15 DIAGNOSIS — N186 End stage renal disease: Secondary | ICD-10-CM | POA: Diagnosis not present

## 2018-02-15 DIAGNOSIS — D509 Iron deficiency anemia, unspecified: Secondary | ICD-10-CM | POA: Diagnosis not present

## 2018-02-15 DIAGNOSIS — D631 Anemia in chronic kidney disease: Secondary | ICD-10-CM | POA: Diagnosis not present

## 2018-02-15 DIAGNOSIS — N2581 Secondary hyperparathyroidism of renal origin: Secondary | ICD-10-CM | POA: Diagnosis not present

## 2018-02-15 DIAGNOSIS — D689 Coagulation defect, unspecified: Secondary | ICD-10-CM | POA: Diagnosis not present

## 2018-02-17 DIAGNOSIS — N186 End stage renal disease: Secondary | ICD-10-CM | POA: Diagnosis not present

## 2018-02-17 DIAGNOSIS — D509 Iron deficiency anemia, unspecified: Secondary | ICD-10-CM | POA: Diagnosis not present

## 2018-02-17 DIAGNOSIS — D631 Anemia in chronic kidney disease: Secondary | ICD-10-CM | POA: Diagnosis not present

## 2018-02-17 DIAGNOSIS — N2581 Secondary hyperparathyroidism of renal origin: Secondary | ICD-10-CM | POA: Diagnosis not present

## 2018-02-17 DIAGNOSIS — D689 Coagulation defect, unspecified: Secondary | ICD-10-CM | POA: Diagnosis not present

## 2018-02-17 DIAGNOSIS — Z992 Dependence on renal dialysis: Secondary | ICD-10-CM | POA: Diagnosis not present

## 2018-02-17 DIAGNOSIS — I129 Hypertensive chronic kidney disease with stage 1 through stage 4 chronic kidney disease, or unspecified chronic kidney disease: Secondary | ICD-10-CM | POA: Diagnosis not present

## 2018-02-22 DIAGNOSIS — D509 Iron deficiency anemia, unspecified: Secondary | ICD-10-CM | POA: Diagnosis not present

## 2018-02-22 DIAGNOSIS — N2581 Secondary hyperparathyroidism of renal origin: Secondary | ICD-10-CM | POA: Diagnosis not present

## 2018-02-22 DIAGNOSIS — H26491 Other secondary cataract, right eye: Secondary | ICD-10-CM | POA: Diagnosis not present

## 2018-02-22 DIAGNOSIS — N186 End stage renal disease: Secondary | ICD-10-CM | POA: Diagnosis not present

## 2018-02-22 DIAGNOSIS — D689 Coagulation defect, unspecified: Secondary | ICD-10-CM | POA: Diagnosis not present

## 2018-02-24 DIAGNOSIS — D509 Iron deficiency anemia, unspecified: Secondary | ICD-10-CM | POA: Diagnosis not present

## 2018-02-24 DIAGNOSIS — D689 Coagulation defect, unspecified: Secondary | ICD-10-CM | POA: Diagnosis not present

## 2018-02-24 DIAGNOSIS — N2581 Secondary hyperparathyroidism of renal origin: Secondary | ICD-10-CM | POA: Diagnosis not present

## 2018-02-24 DIAGNOSIS — N186 End stage renal disease: Secondary | ICD-10-CM | POA: Diagnosis not present

## 2018-02-27 DIAGNOSIS — N2581 Secondary hyperparathyroidism of renal origin: Secondary | ICD-10-CM | POA: Diagnosis not present

## 2018-02-27 DIAGNOSIS — N186 End stage renal disease: Secondary | ICD-10-CM | POA: Diagnosis not present

## 2018-02-27 DIAGNOSIS — D689 Coagulation defect, unspecified: Secondary | ICD-10-CM | POA: Diagnosis not present

## 2018-02-27 DIAGNOSIS — D509 Iron deficiency anemia, unspecified: Secondary | ICD-10-CM | POA: Diagnosis not present

## 2018-03-01 DIAGNOSIS — N186 End stage renal disease: Secondary | ICD-10-CM | POA: Diagnosis not present

## 2018-03-01 DIAGNOSIS — H26492 Other secondary cataract, left eye: Secondary | ICD-10-CM | POA: Diagnosis not present

## 2018-03-01 DIAGNOSIS — D509 Iron deficiency anemia, unspecified: Secondary | ICD-10-CM | POA: Diagnosis not present

## 2018-03-01 DIAGNOSIS — N2581 Secondary hyperparathyroidism of renal origin: Secondary | ICD-10-CM | POA: Diagnosis not present

## 2018-03-01 DIAGNOSIS — D689 Coagulation defect, unspecified: Secondary | ICD-10-CM | POA: Diagnosis not present

## 2018-03-03 DIAGNOSIS — N186 End stage renal disease: Secondary | ICD-10-CM | POA: Diagnosis not present

## 2018-03-03 DIAGNOSIS — D509 Iron deficiency anemia, unspecified: Secondary | ICD-10-CM | POA: Diagnosis not present

## 2018-03-03 DIAGNOSIS — D689 Coagulation defect, unspecified: Secondary | ICD-10-CM | POA: Diagnosis not present

## 2018-03-03 DIAGNOSIS — N2581 Secondary hyperparathyroidism of renal origin: Secondary | ICD-10-CM | POA: Diagnosis not present

## 2018-03-06 DIAGNOSIS — N186 End stage renal disease: Secondary | ICD-10-CM | POA: Diagnosis not present

## 2018-03-06 DIAGNOSIS — N2581 Secondary hyperparathyroidism of renal origin: Secondary | ICD-10-CM | POA: Diagnosis not present

## 2018-03-06 DIAGNOSIS — D689 Coagulation defect, unspecified: Secondary | ICD-10-CM | POA: Diagnosis not present

## 2018-03-06 DIAGNOSIS — D509 Iron deficiency anemia, unspecified: Secondary | ICD-10-CM | POA: Diagnosis not present

## 2018-03-08 DIAGNOSIS — D509 Iron deficiency anemia, unspecified: Secondary | ICD-10-CM | POA: Diagnosis not present

## 2018-03-08 DIAGNOSIS — D689 Coagulation defect, unspecified: Secondary | ICD-10-CM | POA: Diagnosis not present

## 2018-03-08 DIAGNOSIS — N186 End stage renal disease: Secondary | ICD-10-CM | POA: Diagnosis not present

## 2018-03-08 DIAGNOSIS — N2581 Secondary hyperparathyroidism of renal origin: Secondary | ICD-10-CM | POA: Diagnosis not present

## 2018-03-10 DIAGNOSIS — N2581 Secondary hyperparathyroidism of renal origin: Secondary | ICD-10-CM | POA: Diagnosis not present

## 2018-03-10 DIAGNOSIS — N186 End stage renal disease: Secondary | ICD-10-CM | POA: Diagnosis not present

## 2018-03-10 DIAGNOSIS — D689 Coagulation defect, unspecified: Secondary | ICD-10-CM | POA: Diagnosis not present

## 2018-03-10 DIAGNOSIS — D509 Iron deficiency anemia, unspecified: Secondary | ICD-10-CM | POA: Diagnosis not present

## 2018-03-13 DIAGNOSIS — D509 Iron deficiency anemia, unspecified: Secondary | ICD-10-CM | POA: Diagnosis not present

## 2018-03-13 DIAGNOSIS — N186 End stage renal disease: Secondary | ICD-10-CM | POA: Diagnosis not present

## 2018-03-13 DIAGNOSIS — D689 Coagulation defect, unspecified: Secondary | ICD-10-CM | POA: Diagnosis not present

## 2018-03-13 DIAGNOSIS — N2581 Secondary hyperparathyroidism of renal origin: Secondary | ICD-10-CM | POA: Diagnosis not present

## 2018-03-15 DIAGNOSIS — D509 Iron deficiency anemia, unspecified: Secondary | ICD-10-CM | POA: Diagnosis not present

## 2018-03-15 DIAGNOSIS — N2581 Secondary hyperparathyroidism of renal origin: Secondary | ICD-10-CM | POA: Diagnosis not present

## 2018-03-15 DIAGNOSIS — D689 Coagulation defect, unspecified: Secondary | ICD-10-CM | POA: Diagnosis not present

## 2018-03-15 DIAGNOSIS — N186 End stage renal disease: Secondary | ICD-10-CM | POA: Diagnosis not present

## 2018-03-17 DIAGNOSIS — N186 End stage renal disease: Secondary | ICD-10-CM | POA: Diagnosis not present

## 2018-03-17 DIAGNOSIS — N2581 Secondary hyperparathyroidism of renal origin: Secondary | ICD-10-CM | POA: Diagnosis not present

## 2018-03-17 DIAGNOSIS — D689 Coagulation defect, unspecified: Secondary | ICD-10-CM | POA: Diagnosis not present

## 2018-03-17 DIAGNOSIS — D509 Iron deficiency anemia, unspecified: Secondary | ICD-10-CM | POA: Diagnosis not present

## 2018-03-18 DIAGNOSIS — N186 End stage renal disease: Secondary | ICD-10-CM | POA: Diagnosis not present

## 2018-03-18 DIAGNOSIS — I129 Hypertensive chronic kidney disease with stage 1 through stage 4 chronic kidney disease, or unspecified chronic kidney disease: Secondary | ICD-10-CM | POA: Diagnosis not present

## 2018-03-18 DIAGNOSIS — Z992 Dependence on renal dialysis: Secondary | ICD-10-CM | POA: Diagnosis not present

## 2018-03-20 DIAGNOSIS — N2581 Secondary hyperparathyroidism of renal origin: Secondary | ICD-10-CM | POA: Diagnosis not present

## 2018-03-20 DIAGNOSIS — D689 Coagulation defect, unspecified: Secondary | ICD-10-CM | POA: Diagnosis not present

## 2018-03-20 DIAGNOSIS — D509 Iron deficiency anemia, unspecified: Secondary | ICD-10-CM | POA: Diagnosis not present

## 2018-03-20 DIAGNOSIS — N186 End stage renal disease: Secondary | ICD-10-CM | POA: Diagnosis not present

## 2018-03-22 DIAGNOSIS — D689 Coagulation defect, unspecified: Secondary | ICD-10-CM | POA: Diagnosis not present

## 2018-03-22 DIAGNOSIS — D509 Iron deficiency anemia, unspecified: Secondary | ICD-10-CM | POA: Diagnosis not present

## 2018-03-22 DIAGNOSIS — N186 End stage renal disease: Secondary | ICD-10-CM | POA: Diagnosis not present

## 2018-03-22 DIAGNOSIS — N2581 Secondary hyperparathyroidism of renal origin: Secondary | ICD-10-CM | POA: Diagnosis not present

## 2018-03-24 DIAGNOSIS — N2581 Secondary hyperparathyroidism of renal origin: Secondary | ICD-10-CM | POA: Diagnosis not present

## 2018-03-24 DIAGNOSIS — D689 Coagulation defect, unspecified: Secondary | ICD-10-CM | POA: Diagnosis not present

## 2018-03-24 DIAGNOSIS — N186 End stage renal disease: Secondary | ICD-10-CM | POA: Diagnosis not present

## 2018-03-24 DIAGNOSIS — D509 Iron deficiency anemia, unspecified: Secondary | ICD-10-CM | POA: Diagnosis not present

## 2018-03-27 DIAGNOSIS — N2581 Secondary hyperparathyroidism of renal origin: Secondary | ICD-10-CM | POA: Diagnosis not present

## 2018-03-27 DIAGNOSIS — D689 Coagulation defect, unspecified: Secondary | ICD-10-CM | POA: Diagnosis not present

## 2018-03-27 DIAGNOSIS — N186 End stage renal disease: Secondary | ICD-10-CM | POA: Diagnosis not present

## 2018-03-27 DIAGNOSIS — D509 Iron deficiency anemia, unspecified: Secondary | ICD-10-CM | POA: Diagnosis not present

## 2018-03-28 DIAGNOSIS — N186 End stage renal disease: Secondary | ICD-10-CM | POA: Diagnosis not present

## 2018-03-28 DIAGNOSIS — Z720 Tobacco use: Secondary | ICD-10-CM | POA: Diagnosis not present

## 2018-03-28 DIAGNOSIS — E039 Hypothyroidism, unspecified: Secondary | ICD-10-CM | POA: Diagnosis not present

## 2018-03-28 DIAGNOSIS — I132 Hypertensive heart and chronic kidney disease with heart failure and with stage 5 chronic kidney disease, or end stage renal disease: Secondary | ICD-10-CM | POA: Diagnosis not present

## 2018-03-29 DIAGNOSIS — N186 End stage renal disease: Secondary | ICD-10-CM | POA: Diagnosis not present

## 2018-03-29 DIAGNOSIS — D689 Coagulation defect, unspecified: Secondary | ICD-10-CM | POA: Diagnosis not present

## 2018-03-29 DIAGNOSIS — N2581 Secondary hyperparathyroidism of renal origin: Secondary | ICD-10-CM | POA: Diagnosis not present

## 2018-03-29 DIAGNOSIS — D509 Iron deficiency anemia, unspecified: Secondary | ICD-10-CM | POA: Diagnosis not present

## 2018-03-31 DIAGNOSIS — D689 Coagulation defect, unspecified: Secondary | ICD-10-CM | POA: Diagnosis not present

## 2018-03-31 DIAGNOSIS — N2581 Secondary hyperparathyroidism of renal origin: Secondary | ICD-10-CM | POA: Diagnosis not present

## 2018-03-31 DIAGNOSIS — D509 Iron deficiency anemia, unspecified: Secondary | ICD-10-CM | POA: Diagnosis not present

## 2018-03-31 DIAGNOSIS — N186 End stage renal disease: Secondary | ICD-10-CM | POA: Diagnosis not present

## 2018-04-03 DIAGNOSIS — N186 End stage renal disease: Secondary | ICD-10-CM | POA: Diagnosis not present

## 2018-04-03 DIAGNOSIS — D509 Iron deficiency anemia, unspecified: Secondary | ICD-10-CM | POA: Diagnosis not present

## 2018-04-03 DIAGNOSIS — N2581 Secondary hyperparathyroidism of renal origin: Secondary | ICD-10-CM | POA: Diagnosis not present

## 2018-04-03 DIAGNOSIS — D689 Coagulation defect, unspecified: Secondary | ICD-10-CM | POA: Diagnosis not present

## 2018-04-05 DIAGNOSIS — D509 Iron deficiency anemia, unspecified: Secondary | ICD-10-CM | POA: Diagnosis not present

## 2018-04-05 DIAGNOSIS — N186 End stage renal disease: Secondary | ICD-10-CM | POA: Diagnosis not present

## 2018-04-05 DIAGNOSIS — N2581 Secondary hyperparathyroidism of renal origin: Secondary | ICD-10-CM | POA: Diagnosis not present

## 2018-04-05 DIAGNOSIS — D689 Coagulation defect, unspecified: Secondary | ICD-10-CM | POA: Diagnosis not present

## 2018-04-07 DIAGNOSIS — N186 End stage renal disease: Secondary | ICD-10-CM | POA: Diagnosis not present

## 2018-04-07 DIAGNOSIS — D689 Coagulation defect, unspecified: Secondary | ICD-10-CM | POA: Diagnosis not present

## 2018-04-07 DIAGNOSIS — N2581 Secondary hyperparathyroidism of renal origin: Secondary | ICD-10-CM | POA: Diagnosis not present

## 2018-04-07 DIAGNOSIS — D509 Iron deficiency anemia, unspecified: Secondary | ICD-10-CM | POA: Diagnosis not present

## 2018-04-10 DIAGNOSIS — D689 Coagulation defect, unspecified: Secondary | ICD-10-CM | POA: Diagnosis not present

## 2018-04-10 DIAGNOSIS — N2581 Secondary hyperparathyroidism of renal origin: Secondary | ICD-10-CM | POA: Diagnosis not present

## 2018-04-10 DIAGNOSIS — D509 Iron deficiency anemia, unspecified: Secondary | ICD-10-CM | POA: Diagnosis not present

## 2018-04-10 DIAGNOSIS — N186 End stage renal disease: Secondary | ICD-10-CM | POA: Diagnosis not present

## 2018-04-12 DIAGNOSIS — N186 End stage renal disease: Secondary | ICD-10-CM | POA: Diagnosis not present

## 2018-04-12 DIAGNOSIS — D509 Iron deficiency anemia, unspecified: Secondary | ICD-10-CM | POA: Diagnosis not present

## 2018-04-12 DIAGNOSIS — D689 Coagulation defect, unspecified: Secondary | ICD-10-CM | POA: Diagnosis not present

## 2018-04-12 DIAGNOSIS — N2581 Secondary hyperparathyroidism of renal origin: Secondary | ICD-10-CM | POA: Diagnosis not present

## 2018-04-14 DIAGNOSIS — D509 Iron deficiency anemia, unspecified: Secondary | ICD-10-CM | POA: Diagnosis not present

## 2018-04-14 DIAGNOSIS — N2581 Secondary hyperparathyroidism of renal origin: Secondary | ICD-10-CM | POA: Diagnosis not present

## 2018-04-14 DIAGNOSIS — D689 Coagulation defect, unspecified: Secondary | ICD-10-CM | POA: Diagnosis not present

## 2018-04-14 DIAGNOSIS — N186 End stage renal disease: Secondary | ICD-10-CM | POA: Diagnosis not present

## 2018-04-17 DIAGNOSIS — D509 Iron deficiency anemia, unspecified: Secondary | ICD-10-CM | POA: Diagnosis not present

## 2018-04-17 DIAGNOSIS — N186 End stage renal disease: Secondary | ICD-10-CM | POA: Diagnosis not present

## 2018-04-17 DIAGNOSIS — N2581 Secondary hyperparathyroidism of renal origin: Secondary | ICD-10-CM | POA: Diagnosis not present

## 2018-04-17 DIAGNOSIS — D689 Coagulation defect, unspecified: Secondary | ICD-10-CM | POA: Diagnosis not present

## 2018-04-18 DIAGNOSIS — Z992 Dependence on renal dialysis: Secondary | ICD-10-CM | POA: Diagnosis not present

## 2018-04-18 DIAGNOSIS — N186 End stage renal disease: Secondary | ICD-10-CM | POA: Diagnosis not present

## 2018-04-18 DIAGNOSIS — I129 Hypertensive chronic kidney disease with stage 1 through stage 4 chronic kidney disease, or unspecified chronic kidney disease: Secondary | ICD-10-CM | POA: Diagnosis not present

## 2018-04-19 DIAGNOSIS — D689 Coagulation defect, unspecified: Secondary | ICD-10-CM | POA: Diagnosis not present

## 2018-04-19 DIAGNOSIS — N186 End stage renal disease: Secondary | ICD-10-CM | POA: Diagnosis not present

## 2018-04-19 DIAGNOSIS — N2581 Secondary hyperparathyroidism of renal origin: Secondary | ICD-10-CM | POA: Diagnosis not present

## 2018-04-19 DIAGNOSIS — D509 Iron deficiency anemia, unspecified: Secondary | ICD-10-CM | POA: Diagnosis not present

## 2018-04-21 DIAGNOSIS — N186 End stage renal disease: Secondary | ICD-10-CM | POA: Diagnosis not present

## 2018-04-21 DIAGNOSIS — D509 Iron deficiency anemia, unspecified: Secondary | ICD-10-CM | POA: Diagnosis not present

## 2018-04-21 DIAGNOSIS — D689 Coagulation defect, unspecified: Secondary | ICD-10-CM | POA: Diagnosis not present

## 2018-04-21 DIAGNOSIS — N2581 Secondary hyperparathyroidism of renal origin: Secondary | ICD-10-CM | POA: Diagnosis not present

## 2018-04-26 DIAGNOSIS — D689 Coagulation defect, unspecified: Secondary | ICD-10-CM | POA: Diagnosis not present

## 2018-04-26 DIAGNOSIS — N2581 Secondary hyperparathyroidism of renal origin: Secondary | ICD-10-CM | POA: Diagnosis not present

## 2018-04-26 DIAGNOSIS — D509 Iron deficiency anemia, unspecified: Secondary | ICD-10-CM | POA: Diagnosis not present

## 2018-04-26 DIAGNOSIS — N186 End stage renal disease: Secondary | ICD-10-CM | POA: Diagnosis not present

## 2018-04-28 DIAGNOSIS — D689 Coagulation defect, unspecified: Secondary | ICD-10-CM | POA: Diagnosis not present

## 2018-04-28 DIAGNOSIS — D509 Iron deficiency anemia, unspecified: Secondary | ICD-10-CM | POA: Diagnosis not present

## 2018-04-28 DIAGNOSIS — N186 End stage renal disease: Secondary | ICD-10-CM | POA: Diagnosis not present

## 2018-04-28 DIAGNOSIS — N2581 Secondary hyperparathyroidism of renal origin: Secondary | ICD-10-CM | POA: Diagnosis not present

## 2018-05-01 DIAGNOSIS — N2581 Secondary hyperparathyroidism of renal origin: Secondary | ICD-10-CM | POA: Diagnosis not present

## 2018-05-01 DIAGNOSIS — D689 Coagulation defect, unspecified: Secondary | ICD-10-CM | POA: Diagnosis not present

## 2018-05-01 DIAGNOSIS — N186 End stage renal disease: Secondary | ICD-10-CM | POA: Diagnosis not present

## 2018-05-01 DIAGNOSIS — D509 Iron deficiency anemia, unspecified: Secondary | ICD-10-CM | POA: Diagnosis not present

## 2018-05-03 DIAGNOSIS — D509 Iron deficiency anemia, unspecified: Secondary | ICD-10-CM | POA: Diagnosis not present

## 2018-05-03 DIAGNOSIS — D689 Coagulation defect, unspecified: Secondary | ICD-10-CM | POA: Diagnosis not present

## 2018-05-03 DIAGNOSIS — N2581 Secondary hyperparathyroidism of renal origin: Secondary | ICD-10-CM | POA: Diagnosis not present

## 2018-05-03 DIAGNOSIS — N186 End stage renal disease: Secondary | ICD-10-CM | POA: Diagnosis not present

## 2018-05-05 DIAGNOSIS — D689 Coagulation defect, unspecified: Secondary | ICD-10-CM | POA: Diagnosis not present

## 2018-05-05 DIAGNOSIS — N186 End stage renal disease: Secondary | ICD-10-CM | POA: Diagnosis not present

## 2018-05-05 DIAGNOSIS — N2581 Secondary hyperparathyroidism of renal origin: Secondary | ICD-10-CM | POA: Diagnosis not present

## 2018-05-05 DIAGNOSIS — D509 Iron deficiency anemia, unspecified: Secondary | ICD-10-CM | POA: Diagnosis not present

## 2018-05-08 DIAGNOSIS — N2581 Secondary hyperparathyroidism of renal origin: Secondary | ICD-10-CM | POA: Diagnosis not present

## 2018-05-08 DIAGNOSIS — D509 Iron deficiency anemia, unspecified: Secondary | ICD-10-CM | POA: Diagnosis not present

## 2018-05-08 DIAGNOSIS — D689 Coagulation defect, unspecified: Secondary | ICD-10-CM | POA: Diagnosis not present

## 2018-05-08 DIAGNOSIS — N186 End stage renal disease: Secondary | ICD-10-CM | POA: Diagnosis not present

## 2018-05-10 DIAGNOSIS — D689 Coagulation defect, unspecified: Secondary | ICD-10-CM | POA: Diagnosis not present

## 2018-05-10 DIAGNOSIS — N2581 Secondary hyperparathyroidism of renal origin: Secondary | ICD-10-CM | POA: Diagnosis not present

## 2018-05-10 DIAGNOSIS — N186 End stage renal disease: Secondary | ICD-10-CM | POA: Diagnosis not present

## 2018-05-10 DIAGNOSIS — D509 Iron deficiency anemia, unspecified: Secondary | ICD-10-CM | POA: Diagnosis not present

## 2018-05-12 DIAGNOSIS — N186 End stage renal disease: Secondary | ICD-10-CM | POA: Diagnosis not present

## 2018-05-12 DIAGNOSIS — N2581 Secondary hyperparathyroidism of renal origin: Secondary | ICD-10-CM | POA: Diagnosis not present

## 2018-05-12 DIAGNOSIS — D689 Coagulation defect, unspecified: Secondary | ICD-10-CM | POA: Diagnosis not present

## 2018-05-12 DIAGNOSIS — D509 Iron deficiency anemia, unspecified: Secondary | ICD-10-CM | POA: Diagnosis not present

## 2018-05-15 DIAGNOSIS — D689 Coagulation defect, unspecified: Secondary | ICD-10-CM | POA: Diagnosis not present

## 2018-05-15 DIAGNOSIS — N2581 Secondary hyperparathyroidism of renal origin: Secondary | ICD-10-CM | POA: Diagnosis not present

## 2018-05-15 DIAGNOSIS — N186 End stage renal disease: Secondary | ICD-10-CM | POA: Diagnosis not present

## 2018-05-15 DIAGNOSIS — D509 Iron deficiency anemia, unspecified: Secondary | ICD-10-CM | POA: Diagnosis not present

## 2018-05-16 ENCOUNTER — Ambulatory Visit: Payer: Medicare Other | Admitting: Family

## 2018-05-16 ENCOUNTER — Encounter (HOSPITAL_COMMUNITY): Payer: Medicare Other

## 2018-05-17 DIAGNOSIS — D689 Coagulation defect, unspecified: Secondary | ICD-10-CM | POA: Diagnosis not present

## 2018-05-17 DIAGNOSIS — D509 Iron deficiency anemia, unspecified: Secondary | ICD-10-CM | POA: Diagnosis not present

## 2018-05-17 DIAGNOSIS — N2581 Secondary hyperparathyroidism of renal origin: Secondary | ICD-10-CM | POA: Diagnosis not present

## 2018-05-17 DIAGNOSIS — N186 End stage renal disease: Secondary | ICD-10-CM | POA: Diagnosis not present

## 2018-05-18 DIAGNOSIS — N186 End stage renal disease: Secondary | ICD-10-CM | POA: Diagnosis not present

## 2018-05-18 DIAGNOSIS — Z992 Dependence on renal dialysis: Secondary | ICD-10-CM | POA: Diagnosis not present

## 2018-05-18 DIAGNOSIS — I129 Hypertensive chronic kidney disease with stage 1 through stage 4 chronic kidney disease, or unspecified chronic kidney disease: Secondary | ICD-10-CM | POA: Diagnosis not present

## 2018-05-19 DIAGNOSIS — N186 End stage renal disease: Secondary | ICD-10-CM | POA: Diagnosis not present

## 2018-05-19 DIAGNOSIS — N2581 Secondary hyperparathyroidism of renal origin: Secondary | ICD-10-CM | POA: Diagnosis not present

## 2018-05-19 DIAGNOSIS — D689 Coagulation defect, unspecified: Secondary | ICD-10-CM | POA: Diagnosis not present

## 2018-05-19 DIAGNOSIS — D509 Iron deficiency anemia, unspecified: Secondary | ICD-10-CM | POA: Diagnosis not present

## 2018-05-22 DIAGNOSIS — N186 End stage renal disease: Secondary | ICD-10-CM | POA: Diagnosis not present

## 2018-05-22 DIAGNOSIS — N2581 Secondary hyperparathyroidism of renal origin: Secondary | ICD-10-CM | POA: Diagnosis not present

## 2018-05-22 DIAGNOSIS — D509 Iron deficiency anemia, unspecified: Secondary | ICD-10-CM | POA: Diagnosis not present

## 2018-05-22 DIAGNOSIS — D689 Coagulation defect, unspecified: Secondary | ICD-10-CM | POA: Diagnosis not present

## 2018-05-24 DIAGNOSIS — N2581 Secondary hyperparathyroidism of renal origin: Secondary | ICD-10-CM | POA: Diagnosis not present

## 2018-05-24 DIAGNOSIS — D509 Iron deficiency anemia, unspecified: Secondary | ICD-10-CM | POA: Diagnosis not present

## 2018-05-24 DIAGNOSIS — N186 End stage renal disease: Secondary | ICD-10-CM | POA: Diagnosis not present

## 2018-05-24 DIAGNOSIS — D689 Coagulation defect, unspecified: Secondary | ICD-10-CM | POA: Diagnosis not present

## 2018-05-26 DIAGNOSIS — D689 Coagulation defect, unspecified: Secondary | ICD-10-CM | POA: Diagnosis not present

## 2018-05-26 DIAGNOSIS — N186 End stage renal disease: Secondary | ICD-10-CM | POA: Diagnosis not present

## 2018-05-26 DIAGNOSIS — N2581 Secondary hyperparathyroidism of renal origin: Secondary | ICD-10-CM | POA: Diagnosis not present

## 2018-05-26 DIAGNOSIS — D509 Iron deficiency anemia, unspecified: Secondary | ICD-10-CM | POA: Diagnosis not present

## 2018-05-29 DIAGNOSIS — D689 Coagulation defect, unspecified: Secondary | ICD-10-CM | POA: Diagnosis not present

## 2018-05-29 DIAGNOSIS — D509 Iron deficiency anemia, unspecified: Secondary | ICD-10-CM | POA: Diagnosis not present

## 2018-05-29 DIAGNOSIS — N2581 Secondary hyperparathyroidism of renal origin: Secondary | ICD-10-CM | POA: Diagnosis not present

## 2018-05-29 DIAGNOSIS — N186 End stage renal disease: Secondary | ICD-10-CM | POA: Diagnosis not present

## 2018-05-31 DIAGNOSIS — N2581 Secondary hyperparathyroidism of renal origin: Secondary | ICD-10-CM | POA: Diagnosis not present

## 2018-05-31 DIAGNOSIS — D689 Coagulation defect, unspecified: Secondary | ICD-10-CM | POA: Diagnosis not present

## 2018-05-31 DIAGNOSIS — N186 End stage renal disease: Secondary | ICD-10-CM | POA: Diagnosis not present

## 2018-05-31 DIAGNOSIS — D509 Iron deficiency anemia, unspecified: Secondary | ICD-10-CM | POA: Diagnosis not present

## 2018-06-02 DIAGNOSIS — D689 Coagulation defect, unspecified: Secondary | ICD-10-CM | POA: Diagnosis not present

## 2018-06-02 DIAGNOSIS — N2581 Secondary hyperparathyroidism of renal origin: Secondary | ICD-10-CM | POA: Diagnosis not present

## 2018-06-02 DIAGNOSIS — N186 End stage renal disease: Secondary | ICD-10-CM | POA: Diagnosis not present

## 2018-06-02 DIAGNOSIS — D509 Iron deficiency anemia, unspecified: Secondary | ICD-10-CM | POA: Diagnosis not present

## 2018-06-05 DIAGNOSIS — D689 Coagulation defect, unspecified: Secondary | ICD-10-CM | POA: Diagnosis not present

## 2018-06-05 DIAGNOSIS — N2581 Secondary hyperparathyroidism of renal origin: Secondary | ICD-10-CM | POA: Diagnosis not present

## 2018-06-05 DIAGNOSIS — D509 Iron deficiency anemia, unspecified: Secondary | ICD-10-CM | POA: Diagnosis not present

## 2018-06-05 DIAGNOSIS — N186 End stage renal disease: Secondary | ICD-10-CM | POA: Diagnosis not present

## 2018-06-07 DIAGNOSIS — D689 Coagulation defect, unspecified: Secondary | ICD-10-CM | POA: Diagnosis not present

## 2018-06-07 DIAGNOSIS — D509 Iron deficiency anemia, unspecified: Secondary | ICD-10-CM | POA: Diagnosis not present

## 2018-06-07 DIAGNOSIS — N2581 Secondary hyperparathyroidism of renal origin: Secondary | ICD-10-CM | POA: Diagnosis not present

## 2018-06-07 DIAGNOSIS — N186 End stage renal disease: Secondary | ICD-10-CM | POA: Diagnosis not present

## 2018-06-09 DIAGNOSIS — N2581 Secondary hyperparathyroidism of renal origin: Secondary | ICD-10-CM | POA: Diagnosis not present

## 2018-06-09 DIAGNOSIS — D689 Coagulation defect, unspecified: Secondary | ICD-10-CM | POA: Diagnosis not present

## 2018-06-09 DIAGNOSIS — N186 End stage renal disease: Secondary | ICD-10-CM | POA: Diagnosis not present

## 2018-06-09 DIAGNOSIS — D509 Iron deficiency anemia, unspecified: Secondary | ICD-10-CM | POA: Diagnosis not present

## 2018-06-12 DIAGNOSIS — D509 Iron deficiency anemia, unspecified: Secondary | ICD-10-CM | POA: Diagnosis not present

## 2018-06-12 DIAGNOSIS — D689 Coagulation defect, unspecified: Secondary | ICD-10-CM | POA: Diagnosis not present

## 2018-06-12 DIAGNOSIS — N186 End stage renal disease: Secondary | ICD-10-CM | POA: Diagnosis not present

## 2018-06-12 DIAGNOSIS — N2581 Secondary hyperparathyroidism of renal origin: Secondary | ICD-10-CM | POA: Diagnosis not present

## 2018-06-14 DIAGNOSIS — N186 End stage renal disease: Secondary | ICD-10-CM | POA: Diagnosis not present

## 2018-06-14 DIAGNOSIS — D689 Coagulation defect, unspecified: Secondary | ICD-10-CM | POA: Diagnosis not present

## 2018-06-14 DIAGNOSIS — N2581 Secondary hyperparathyroidism of renal origin: Secondary | ICD-10-CM | POA: Diagnosis not present

## 2018-06-14 DIAGNOSIS — D509 Iron deficiency anemia, unspecified: Secondary | ICD-10-CM | POA: Diagnosis not present

## 2018-06-16 DIAGNOSIS — D689 Coagulation defect, unspecified: Secondary | ICD-10-CM | POA: Diagnosis not present

## 2018-06-16 DIAGNOSIS — N2581 Secondary hyperparathyroidism of renal origin: Secondary | ICD-10-CM | POA: Diagnosis not present

## 2018-06-16 DIAGNOSIS — N186 End stage renal disease: Secondary | ICD-10-CM | POA: Diagnosis not present

## 2018-06-16 DIAGNOSIS — D509 Iron deficiency anemia, unspecified: Secondary | ICD-10-CM | POA: Diagnosis not present

## 2018-06-18 DIAGNOSIS — N186 End stage renal disease: Secondary | ICD-10-CM | POA: Diagnosis not present

## 2018-06-18 DIAGNOSIS — Z992 Dependence on renal dialysis: Secondary | ICD-10-CM | POA: Diagnosis not present

## 2018-06-18 DIAGNOSIS — I129 Hypertensive chronic kidney disease with stage 1 through stage 4 chronic kidney disease, or unspecified chronic kidney disease: Secondary | ICD-10-CM | POA: Diagnosis not present

## 2018-06-19 DIAGNOSIS — N2581 Secondary hyperparathyroidism of renal origin: Secondary | ICD-10-CM | POA: Diagnosis not present

## 2018-06-19 DIAGNOSIS — N186 End stage renal disease: Secondary | ICD-10-CM | POA: Diagnosis not present

## 2018-06-19 DIAGNOSIS — D689 Coagulation defect, unspecified: Secondary | ICD-10-CM | POA: Diagnosis not present

## 2018-06-19 DIAGNOSIS — D509 Iron deficiency anemia, unspecified: Secondary | ICD-10-CM | POA: Diagnosis not present

## 2018-06-23 DIAGNOSIS — D689 Coagulation defect, unspecified: Secondary | ICD-10-CM | POA: Diagnosis not present

## 2018-06-23 DIAGNOSIS — N2581 Secondary hyperparathyroidism of renal origin: Secondary | ICD-10-CM | POA: Diagnosis not present

## 2018-06-23 DIAGNOSIS — D509 Iron deficiency anemia, unspecified: Secondary | ICD-10-CM | POA: Diagnosis not present

## 2018-06-23 DIAGNOSIS — N186 End stage renal disease: Secondary | ICD-10-CM | POA: Diagnosis not present

## 2018-06-26 DIAGNOSIS — D509 Iron deficiency anemia, unspecified: Secondary | ICD-10-CM | POA: Diagnosis not present

## 2018-06-26 DIAGNOSIS — D689 Coagulation defect, unspecified: Secondary | ICD-10-CM | POA: Diagnosis not present

## 2018-06-26 DIAGNOSIS — N186 End stage renal disease: Secondary | ICD-10-CM | POA: Diagnosis not present

## 2018-06-26 DIAGNOSIS — N2581 Secondary hyperparathyroidism of renal origin: Secondary | ICD-10-CM | POA: Diagnosis not present

## 2018-06-27 DIAGNOSIS — E7800 Pure hypercholesterolemia, unspecified: Secondary | ICD-10-CM | POA: Diagnosis not present

## 2018-06-27 DIAGNOSIS — E039 Hypothyroidism, unspecified: Secondary | ICD-10-CM | POA: Diagnosis not present

## 2018-06-27 DIAGNOSIS — I132 Hypertensive heart and chronic kidney disease with heart failure and with stage 5 chronic kidney disease, or end stage renal disease: Secondary | ICD-10-CM | POA: Diagnosis not present

## 2018-06-28 DIAGNOSIS — D689 Coagulation defect, unspecified: Secondary | ICD-10-CM | POA: Diagnosis not present

## 2018-06-28 DIAGNOSIS — N186 End stage renal disease: Secondary | ICD-10-CM | POA: Diagnosis not present

## 2018-06-28 DIAGNOSIS — D509 Iron deficiency anemia, unspecified: Secondary | ICD-10-CM | POA: Diagnosis not present

## 2018-06-28 DIAGNOSIS — N2581 Secondary hyperparathyroidism of renal origin: Secondary | ICD-10-CM | POA: Diagnosis not present

## 2018-06-29 DIAGNOSIS — N186 End stage renal disease: Secondary | ICD-10-CM | POA: Diagnosis not present

## 2018-06-29 DIAGNOSIS — Z0000 Encounter for general adult medical examination without abnormal findings: Secondary | ICD-10-CM | POA: Diagnosis not present

## 2018-06-29 DIAGNOSIS — I132 Hypertensive heart and chronic kidney disease with heart failure and with stage 5 chronic kidney disease, or end stage renal disease: Secondary | ICD-10-CM | POA: Diagnosis not present

## 2018-06-29 DIAGNOSIS — E46 Unspecified protein-calorie malnutrition: Secondary | ICD-10-CM | POA: Diagnosis not present

## 2018-06-30 DIAGNOSIS — D689 Coagulation defect, unspecified: Secondary | ICD-10-CM | POA: Diagnosis not present

## 2018-06-30 DIAGNOSIS — N2581 Secondary hyperparathyroidism of renal origin: Secondary | ICD-10-CM | POA: Diagnosis not present

## 2018-06-30 DIAGNOSIS — D509 Iron deficiency anemia, unspecified: Secondary | ICD-10-CM | POA: Diagnosis not present

## 2018-06-30 DIAGNOSIS — N186 End stage renal disease: Secondary | ICD-10-CM | POA: Diagnosis not present

## 2018-07-03 DIAGNOSIS — D509 Iron deficiency anemia, unspecified: Secondary | ICD-10-CM | POA: Diagnosis not present

## 2018-07-03 DIAGNOSIS — D689 Coagulation defect, unspecified: Secondary | ICD-10-CM | POA: Diagnosis not present

## 2018-07-03 DIAGNOSIS — N186 End stage renal disease: Secondary | ICD-10-CM | POA: Diagnosis not present

## 2018-07-03 DIAGNOSIS — N2581 Secondary hyperparathyroidism of renal origin: Secondary | ICD-10-CM | POA: Diagnosis not present

## 2018-07-05 DIAGNOSIS — D509 Iron deficiency anemia, unspecified: Secondary | ICD-10-CM | POA: Diagnosis not present

## 2018-07-05 DIAGNOSIS — N186 End stage renal disease: Secondary | ICD-10-CM | POA: Diagnosis not present

## 2018-07-05 DIAGNOSIS — D689 Coagulation defect, unspecified: Secondary | ICD-10-CM | POA: Diagnosis not present

## 2018-07-05 DIAGNOSIS — N2581 Secondary hyperparathyroidism of renal origin: Secondary | ICD-10-CM | POA: Diagnosis not present

## 2018-07-07 DIAGNOSIS — N2581 Secondary hyperparathyroidism of renal origin: Secondary | ICD-10-CM | POA: Diagnosis not present

## 2018-07-07 DIAGNOSIS — D689 Coagulation defect, unspecified: Secondary | ICD-10-CM | POA: Diagnosis not present

## 2018-07-07 DIAGNOSIS — N186 End stage renal disease: Secondary | ICD-10-CM | POA: Diagnosis not present

## 2018-07-07 DIAGNOSIS — D509 Iron deficiency anemia, unspecified: Secondary | ICD-10-CM | POA: Diagnosis not present

## 2018-07-10 DIAGNOSIS — N2581 Secondary hyperparathyroidism of renal origin: Secondary | ICD-10-CM | POA: Diagnosis not present

## 2018-07-10 DIAGNOSIS — D509 Iron deficiency anemia, unspecified: Secondary | ICD-10-CM | POA: Diagnosis not present

## 2018-07-10 DIAGNOSIS — D689 Coagulation defect, unspecified: Secondary | ICD-10-CM | POA: Diagnosis not present

## 2018-07-10 DIAGNOSIS — N186 End stage renal disease: Secondary | ICD-10-CM | POA: Diagnosis not present

## 2018-07-14 DIAGNOSIS — D689 Coagulation defect, unspecified: Secondary | ICD-10-CM | POA: Diagnosis not present

## 2018-07-14 DIAGNOSIS — N186 End stage renal disease: Secondary | ICD-10-CM | POA: Diagnosis not present

## 2018-07-14 DIAGNOSIS — D509 Iron deficiency anemia, unspecified: Secondary | ICD-10-CM | POA: Diagnosis not present

## 2018-07-14 DIAGNOSIS — N2581 Secondary hyperparathyroidism of renal origin: Secondary | ICD-10-CM | POA: Diagnosis not present

## 2018-07-17 DIAGNOSIS — D689 Coagulation defect, unspecified: Secondary | ICD-10-CM | POA: Diagnosis not present

## 2018-07-17 DIAGNOSIS — D509 Iron deficiency anemia, unspecified: Secondary | ICD-10-CM | POA: Diagnosis not present

## 2018-07-17 DIAGNOSIS — N2581 Secondary hyperparathyroidism of renal origin: Secondary | ICD-10-CM | POA: Diagnosis not present

## 2018-07-17 DIAGNOSIS — N186 End stage renal disease: Secondary | ICD-10-CM | POA: Diagnosis not present

## 2018-07-18 DIAGNOSIS — N186 End stage renal disease: Secondary | ICD-10-CM | POA: Diagnosis not present

## 2018-07-18 DIAGNOSIS — I129 Hypertensive chronic kidney disease with stage 1 through stage 4 chronic kidney disease, or unspecified chronic kidney disease: Secondary | ICD-10-CM | POA: Diagnosis not present

## 2018-07-18 DIAGNOSIS — Z992 Dependence on renal dialysis: Secondary | ICD-10-CM | POA: Diagnosis not present

## 2018-07-19 DIAGNOSIS — N2581 Secondary hyperparathyroidism of renal origin: Secondary | ICD-10-CM | POA: Diagnosis not present

## 2018-07-19 DIAGNOSIS — D689 Coagulation defect, unspecified: Secondary | ICD-10-CM | POA: Diagnosis not present

## 2018-07-19 DIAGNOSIS — N186 End stage renal disease: Secondary | ICD-10-CM | POA: Diagnosis not present

## 2018-07-19 DIAGNOSIS — D509 Iron deficiency anemia, unspecified: Secondary | ICD-10-CM | POA: Diagnosis not present

## 2018-07-21 DIAGNOSIS — D689 Coagulation defect, unspecified: Secondary | ICD-10-CM | POA: Diagnosis not present

## 2018-07-21 DIAGNOSIS — N2581 Secondary hyperparathyroidism of renal origin: Secondary | ICD-10-CM | POA: Diagnosis not present

## 2018-07-21 DIAGNOSIS — N186 End stage renal disease: Secondary | ICD-10-CM | POA: Diagnosis not present

## 2018-07-21 DIAGNOSIS — D509 Iron deficiency anemia, unspecified: Secondary | ICD-10-CM | POA: Diagnosis not present

## 2018-07-24 DIAGNOSIS — D509 Iron deficiency anemia, unspecified: Secondary | ICD-10-CM | POA: Diagnosis not present

## 2018-07-24 DIAGNOSIS — N2581 Secondary hyperparathyroidism of renal origin: Secondary | ICD-10-CM | POA: Diagnosis not present

## 2018-07-24 DIAGNOSIS — N186 End stage renal disease: Secondary | ICD-10-CM | POA: Diagnosis not present

## 2018-07-24 DIAGNOSIS — D689 Coagulation defect, unspecified: Secondary | ICD-10-CM | POA: Diagnosis not present

## 2018-07-26 DIAGNOSIS — N2581 Secondary hyperparathyroidism of renal origin: Secondary | ICD-10-CM | POA: Diagnosis not present

## 2018-07-26 DIAGNOSIS — D509 Iron deficiency anemia, unspecified: Secondary | ICD-10-CM | POA: Diagnosis not present

## 2018-07-26 DIAGNOSIS — D689 Coagulation defect, unspecified: Secondary | ICD-10-CM | POA: Diagnosis not present

## 2018-07-26 DIAGNOSIS — N186 End stage renal disease: Secondary | ICD-10-CM | POA: Diagnosis not present

## 2018-07-28 DIAGNOSIS — D509 Iron deficiency anemia, unspecified: Secondary | ICD-10-CM | POA: Diagnosis not present

## 2018-07-28 DIAGNOSIS — D689 Coagulation defect, unspecified: Secondary | ICD-10-CM | POA: Diagnosis not present

## 2018-07-28 DIAGNOSIS — N2581 Secondary hyperparathyroidism of renal origin: Secondary | ICD-10-CM | POA: Diagnosis not present

## 2018-07-28 DIAGNOSIS — N186 End stage renal disease: Secondary | ICD-10-CM | POA: Diagnosis not present

## 2018-07-31 DIAGNOSIS — N2581 Secondary hyperparathyroidism of renal origin: Secondary | ICD-10-CM | POA: Diagnosis not present

## 2018-07-31 DIAGNOSIS — D509 Iron deficiency anemia, unspecified: Secondary | ICD-10-CM | POA: Diagnosis not present

## 2018-07-31 DIAGNOSIS — D689 Coagulation defect, unspecified: Secondary | ICD-10-CM | POA: Diagnosis not present

## 2018-07-31 DIAGNOSIS — N186 End stage renal disease: Secondary | ICD-10-CM | POA: Diagnosis not present

## 2018-08-02 DIAGNOSIS — D509 Iron deficiency anemia, unspecified: Secondary | ICD-10-CM | POA: Diagnosis not present

## 2018-08-02 DIAGNOSIS — N186 End stage renal disease: Secondary | ICD-10-CM | POA: Diagnosis not present

## 2018-08-02 DIAGNOSIS — D689 Coagulation defect, unspecified: Secondary | ICD-10-CM | POA: Diagnosis not present

## 2018-08-02 DIAGNOSIS — N2581 Secondary hyperparathyroidism of renal origin: Secondary | ICD-10-CM | POA: Diagnosis not present

## 2018-08-07 ENCOUNTER — Emergency Department (HOSPITAL_COMMUNITY)
Admission: EM | Admit: 2018-08-07 | Discharge: 2018-08-07 | Disposition: A | Discharge status: Home / Self Care | Payer: Medicare Other | Attending: Emergency Medicine | Admitting: Emergency Medicine

## 2018-08-07 ENCOUNTER — Other Ambulatory Visit: Payer: Self-pay

## 2018-08-07 ENCOUNTER — Emergency Department (HOSPITAL_COMMUNITY): Payer: Medicare Other

## 2018-08-07 ENCOUNTER — Encounter (HOSPITAL_COMMUNITY): Payer: Self-pay | Admitting: Emergency Medicine

## 2018-08-07 DIAGNOSIS — F17210 Nicotine dependence, cigarettes, uncomplicated: Secondary | ICD-10-CM | POA: Insufficient documentation

## 2018-08-07 DIAGNOSIS — R0602 Shortness of breath: Secondary | ICD-10-CM | POA: Diagnosis not present

## 2018-08-07 DIAGNOSIS — I161 Hypertensive emergency: Secondary | ICD-10-CM | POA: Diagnosis not present

## 2018-08-07 DIAGNOSIS — I714 Abdominal aortic aneurysm, without rupture, unspecified: Secondary | ICD-10-CM | POA: Diagnosis present

## 2018-08-07 DIAGNOSIS — Z5320 Procedure and treatment not carried out because of patient's decision for unspecified reasons: Secondary | ICD-10-CM | POA: Insufficient documentation

## 2018-08-07 DIAGNOSIS — I120 Hypertensive chronic kidney disease with stage 5 chronic kidney disease or end stage renal disease: Secondary | ICD-10-CM | POA: Diagnosis not present

## 2018-08-07 DIAGNOSIS — E079 Disorder of thyroid, unspecified: Secondary | ICD-10-CM | POA: Diagnosis not present

## 2018-08-07 DIAGNOSIS — I129 Hypertensive chronic kidney disease with stage 1 through stage 4 chronic kidney disease, or unspecified chronic kidney disease: Secondary | ICD-10-CM | POA: Diagnosis present

## 2018-08-07 DIAGNOSIS — Z992 Dependence on renal dialysis: Secondary | ICD-10-CM | POA: Insufficient documentation

## 2018-08-07 DIAGNOSIS — N186 End stage renal disease: Secondary | ICD-10-CM | POA: Diagnosis not present

## 2018-08-07 DIAGNOSIS — E8779 Other fluid overload: Secondary | ICD-10-CM | POA: Diagnosis present

## 2018-08-07 DIAGNOSIS — I10 Essential (primary) hypertension: Secondary | ICD-10-CM | POA: Diagnosis present

## 2018-08-07 DIAGNOSIS — E039 Hypothyroidism, unspecified: Secondary | ICD-10-CM | POA: Diagnosis present

## 2018-08-07 DIAGNOSIS — R9431 Abnormal electrocardiogram [ECG] [EKG]: Secondary | ICD-10-CM | POA: Diagnosis present

## 2018-08-07 LAB — BASIC METABOLIC PANEL
Anion gap: 15 (ref 5–15)
BUN: 41 mg/dL — ABNORMAL HIGH (ref 8–23)
CO2: 26 mmol/L (ref 22–32)
Calcium: 9.5 mg/dL (ref 8.9–10.3)
Chloride: 96 mmol/L — ABNORMAL LOW (ref 98–111)
Creatinine, Ser: 9.15 mg/dL — ABNORMAL HIGH (ref 0.44–1.00)
GFR calc Af Amer: 4 mL/min — ABNORMAL LOW (ref >60)
GFR calc non Af Amer: 4 mL/min — ABNORMAL LOW (ref >60)
Glucose, Bld: 110 mg/dL — ABNORMAL HIGH (ref 70–99)
Potassium: 3.8 mmol/L (ref 3.5–5.1)
Sodium: 137 mmol/L (ref 135–145)

## 2018-08-07 LAB — CBC
HCT: 41.2 % (ref 36.0–46.0)
Hemoglobin: 13.6 g/dL (ref 12.0–15.0)
MCH: 32.2 pg (ref 26.0–34.0)
MCHC: 33 g/dL (ref 30.0–36.0)
MCV: 97.4 fL (ref 80.0–100.0)
Platelets: 189 10*3/uL (ref 150–400)
RBC: 4.23 MIL/uL (ref 3.87–5.11)
RDW: 13.8 % (ref 11.5–15.5)
WBC: 10.4 10*3/uL (ref 4.0–10.5)
nRBC: 0 % (ref 0.0–0.2)

## 2018-08-07 LAB — PROTIME-INR
INR: 1 (ref 0.8–1.2)
Prothrombin Time: 13.3 seconds (ref 11.4–15.2)

## 2018-08-07 MED ORDER — NITROGLYCERIN 2 % TD OINT
1.0000 [in_us] | TOPICAL_OINTMENT | Freq: Once | TRANSDERMAL | Status: DC
  Filled 2018-08-07: qty 1

## 2018-08-07 NOTE — ED Triage Notes (Signed)
Pt reports hypertension and SOB since Friday 08/04/2018. Pt reports she went to dialysis on Friday but because her BP was elevated they would not put her on the machine. Pt reports her PCP told her to come into the ER for further evaluation.

## 2018-08-07 NOTE — ED Notes (Signed)
Pt currently on phone updating spouse

## 2018-08-07 NOTE — ED Notes (Signed)
Pt refusing any further tx at this time. States she did not agree to stay. States she agreed to come tomorrow for dialysis. 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 ED PA made aware. PA to go talk to patient.

## 2018-08-07 NOTE — ED Notes (Signed)
Patient Alert and oriented to baseline. Stable and ambulatory to baseline. Patient verbalized understanding of the discharge instructions.  Patient belongings were taken by the patient.   

## 2018-08-07 NOTE — Discharge Instructions (Signed)
Call dialysis center tomorrow morning to arrange for a session Return if worsening

## 2018-08-07 NOTE — ED Notes (Signed)
[place] pts daughter wants an update

## 2018-08-07 NOTE — ED Provider Notes (Signed)
[facility] EMERGENCY DEPARTMENT Provider Note   CSN: [id number] Arrival date & time: 08/07/18  1109     History   Chief Complaint Chief Complaint  Patient presents with  . Shortness of Breath  . Hypertension    HPI Kristin Gross is a 84 y.o. female who presents with SOB and elevated BP. PMH significant for ESRD on dialysis M, W, F, HTN, HLD, hx of AAA s/p repair. She states she went to dialysis on Wednesday and was told her BP was high. She finished the full session. She decided not to go on Friday because her BP was high that day too and she was told that if her BP was too high she wouldn't be able to come. She called her PCP, Dr. Brigitte Pulse, and she was recommended to keep a log of her BP over the weekend. She called Dr. Brigitte Pulse this morning and they told her to come to the ED. She reports associated SOB which started on Thursday. It is worse with exertion. She denies headache, LOC, dizziness, vision changes, chest or abdominal pain.      HPI  Past Medical History:  Diagnosis Date  . AAA (abdominal aortic aneurysm) ([place])   . Chronic kidney disease   . Hyperlipidemia   . Hypertension   . PONV (postoperative nausea and vomiting)   . Thyroid disease     Patient Active Problem List   Diagnosis Date Noted  . ESRD on dialysis ([place]) 08/26/2017  . AAA (abdominal aortic aneurysm) ([place]) 03/24/2017  . Preoperative cardiovascular examination 03/14/2017  . Hypertensive chronic kidney disease 03/14/2017  . AAA (abdominal aortic aneurysm) without rupture ([place]) 03/14/2017    Past Surgical History:  Procedure Laterality Date  . ABDOMINAL HYSTERECTOMY    . AORTA - BILATERAL FEMORAL ARTERY BYPASS GRAFT N/A 03/24/2017   Procedure: AORTO BI ILIAC BYPASS GRAFT;  Surgeon: Waynetta Sandy, MD;  Location: [facility];  Service: Vascular;  Laterality: N/A;  . AV FISTULA PLACEMENT Left 05/25/2017   Procedure: ARTERIOVENOUS (AV) FISTULA CREATION LEFT ARM;  Surgeon: Waynetta Sandy, MD;  Location: [facility];  Service: Vascular;  Laterality: Left;  . BASCILIC VEIN TRANSPOSITION Left 08/02/2017   Procedure: BASILIC VEIN TRANSPOSITION SECOND STAGE;  Surgeon: Waynetta Sandy, MD;  Location: [facility];  Service: Vascular;  Laterality: Left;  . CATARACT EXTRACTION, BILATERAL    . EYE SURGERY    . INSERTION OF DIALYSIS CATHETER Right 05/25/2017   Procedure: INSERTION OF TUNNELED  DIALYSIS CATHETER RIGHT INTERNAL JUGULAR;  Surgeon: Waynetta Sandy, MD;  Location: [facility];  Service: Vascular;  Laterality: Right;     OB History   No obstetric history on file.      Home Medications    Prior to Admission medications   Medication Sig Start Date End Date Taking? Authorizing Provider  aspirin 81 MG tablet Take 81 mg by mouth daily.    [provider]  cholecalciferol (VITAMIN D) 1000 units tablet Take 1,000 Units by mouth daily.    [provider]  ferric citrate (AURYXIA) 1 GM 210 MG(Fe) tablet Take 210 mg by mouth daily.    [provider]  levothyroxine (SYNTHROID, LEVOTHROID) 100 MCG tablet Take 100 mcg by mouth daily before breakfast.  06/17/17   [provider]  metoprolol tartrate (LOPRESSOR) 50 MG tablet Take 50 mg by mouth daily.     [provider]  oxyCODONE-acetaminophen (PERCOCET) 5-325 MG tablet Take 1 tablet by mouth every 6 (  six) hours as needed for severe pain. Patient not taking: Reported on 08/26/2017 08/02/17   Gabriel Earing, PA-C  rosuvastatin (CRESTOR) 10 MG tablet Take 10 mg by mouth daily.     [provider]    Family History Family History  Problem Relation Age of Onset  . Heart disease Sister   . Heart attack Sister   . Hypertension Sister   . Cancer Mother     Social History Social History   Tobacco Use  . Smoking status: Current Some Day Smoker    Packs/day: 1.00    Years: 60.00    Pack years: 60.00    Types: Cigarettes  . Smokeless tobacco: Never Used   Substance Use Topics  . Alcohol use: No    Frequency: Never  . Drug use: No     Allergies   Lisinopril   Review of Systems Review of Systems  Constitutional: Negative for fever.  Respiratory: Positive for shortness of breath. Negative for cough and wheezing.   Cardiovascular: Negative for chest pain, palpitations and leg swelling.  Gastrointestinal: Negative for abdominal pain, nausea and vomiting.  Neurological: Negative for syncope and headaches.  All other systems reviewed and are negative.    Physical Exam Updated Vital Signs BP (!) 211/110   Pulse 71   Temp 98 F (36.7 C) (Oral)   Resp 16   Ht 5\' 5"  (1.651 m)   Wt 56.7 kg   LMP  (Exact Date)   SpO2 94%   BMI 20.80 kg/m   Physical Exam Vitals signs and nursing note reviewed.  Constitutional:      General: She is not in acute distress.    Appearance: She is well-developed. She is not ill-appearing.  HENT:     Head: Normocephalic and atraumatic.  Eyes:     General: No scleral icterus.       Right eye: No discharge.        Left eye: No discharge.     Conjunctiva/sclera: Conjunctivae normal.     Pupils: Pupils are equal, round, and reactive to light.  Neck:     Musculoskeletal: Normal range of motion.  Cardiovascular:     Rate and Rhythm: Normal rate.  Pulmonary:     Effort: Pulmonary effort is normal. No respiratory distress.  Abdominal:     General: There is no distension.  Skin:    General: Skin is warm and dry.  Neurological:     Mental Status: She is alert and oriented to person, place, and time.  Psychiatric:        Behavior: Behavior normal.      ED Treatments / Results  Labs (all labs ordered are listed, but only abnormal results are displayed) Labs Reviewed  BASIC METABOLIC PANEL - Abnormal; Notable for the following components:      Result Value   Chloride 96 (*)    Glucose, Bld 110 (*)    BUN 41 (*)    Creatinine, Ser 9.15 (*)    GFR calc non Af Amer 4 (*)    GFR calc Af Amer 4  (*)    All other components within normal limits  SARS CORONAVIRUS 2 (HOSPITAL ORDER, [facility])  CBC  PROTIME-INR    EKG EKG Interpretation  Date/Time:  Monday August 07 2018 11:15:19 EDT Ventricular Rate:  64 PR Interval:  132 QRS Duration: 132 QT Interval:  486 QTC Calculation: 501 R Axis:   18 Text Interpretation:  Normal  sinus rhythm Right bundle branch block T wave abnormality, consider inferior ischemia Abnormal ECG No old tracing to compare Confirmed by Deno Etienne [phone]) on 08/07/2018 3:49:02 PM   Radiology Dg Chest 2 View  Result Date: 08/07/2018 CLINICAL DATA:  Shortness of breath. EXAM: CHEST - 2 VIEW COMPARISON:  05/25/2017. FINDINGS: Previously identified right central line has been removed. Cardiomegaly with bilateral interstitial prominence and bilateral pleural effusions, particular prominent left. Findings most consistent with CHF. Pneumonitis cannot be excluded. No pneumothorax. No acute bony abnormality. IMPRESSION: Cardiomegaly with bilateral from interstitial prominence and bilateral pleural effusions, particularly prominent on the left. Findings most consistent CHF. Pneumonitis cannot be excluded. Electronically Signed   By: Marcello Moores  Register   On: 08/07/2018 12:36    Procedures Procedures (including critical care time)  Medications Ordered in ED Medications  nitroGLYCERIN (NITROGLYN) 2 % ointment 1 inch (1 inch Topical Not Given 08/07/18 1936)     Initial Impression / Assessment and Plan / ED Course  I have reviewed the triage vital signs and the nursing notes.  Pertinent labs & imaging results that were available during my care of the patient were reviewed by me and considered in my medical decision making (see chart for details).  84 year old female presents with significantly elevated blood pressures, shortness of breath, and to missed dialysis sessions.  Her blood pressure is significantly elevated here.  Otherwise vital signs  are normal.  EKG is sinus rhythm with right bundle branch block.  CBC is normal.  BMP is remarkable for normal potassium, and elevated BUN/creatinine.  Chest x-ray shows bilateral pleural effusions with bilateral interstitial prominence.  I discussed with the patient that she needs to go have dialysis and admission is recommended.  I spoke with the nephrologist Dr. Justin Mend who agrees.  Initially the patient agreed to admission and I talked to Dr. Roel Cluck with [facility] who was going to admit however the patient then refused stating she needs to go home and take care of her husband who has multiple medical problems.  She would prefer to arrange to go to dialysis as an outpatient tomorrow. Shared visit with Dr. Tyrone Nine. Strongly encouraged her to call her dialysis center tomorrow. She was give return precautions.  Final Clinical Impressions(s) / ED Diagnoses   Final diagnoses:  Hypertensive emergency  ESRD (end stage renal disease) [facility])    ED Discharge Orders    None       Recardo Evangelist, PA-C 08/07/18 2009    [address], DO 08/08/18 1513

## 2018-08-08 DIAGNOSIS — N186 End stage renal disease: Secondary | ICD-10-CM | POA: Diagnosis not present

## 2018-08-08 DIAGNOSIS — N2581 Secondary hyperparathyroidism of renal origin: Secondary | ICD-10-CM | POA: Diagnosis not present

## 2018-08-08 DIAGNOSIS — D509 Iron deficiency anemia, unspecified: Secondary | ICD-10-CM | POA: Diagnosis not present

## 2018-08-08 DIAGNOSIS — D689 Coagulation defect, unspecified: Secondary | ICD-10-CM | POA: Diagnosis not present

## 2018-08-09 DIAGNOSIS — N186 End stage renal disease: Secondary | ICD-10-CM | POA: Diagnosis not present

## 2018-08-09 DIAGNOSIS — D509 Iron deficiency anemia, unspecified: Secondary | ICD-10-CM | POA: Diagnosis not present

## 2018-08-09 DIAGNOSIS — D689 Coagulation defect, unspecified: Secondary | ICD-10-CM | POA: Diagnosis not present

## 2018-08-09 DIAGNOSIS — N2581 Secondary hyperparathyroidism of renal origin: Secondary | ICD-10-CM | POA: Diagnosis not present

## 2018-08-11 DIAGNOSIS — D509 Iron deficiency anemia, unspecified: Secondary | ICD-10-CM | POA: Diagnosis not present

## 2018-08-11 DIAGNOSIS — N2581 Secondary hyperparathyroidism of renal origin: Secondary | ICD-10-CM | POA: Diagnosis not present

## 2018-08-11 DIAGNOSIS — N186 End stage renal disease: Secondary | ICD-10-CM | POA: Diagnosis not present

## 2018-08-11 DIAGNOSIS — D689 Coagulation defect, unspecified: Secondary | ICD-10-CM | POA: Diagnosis not present

## 2018-08-14 DIAGNOSIS — D509 Iron deficiency anemia, unspecified: Secondary | ICD-10-CM | POA: Diagnosis not present

## 2018-08-14 DIAGNOSIS — N186 End stage renal disease: Secondary | ICD-10-CM | POA: Diagnosis not present

## 2018-08-14 DIAGNOSIS — D689 Coagulation defect, unspecified: Secondary | ICD-10-CM | POA: Diagnosis not present

## 2018-08-14 DIAGNOSIS — N2581 Secondary hyperparathyroidism of renal origin: Secondary | ICD-10-CM | POA: Diagnosis not present

## 2018-08-16 DIAGNOSIS — D509 Iron deficiency anemia, unspecified: Secondary | ICD-10-CM | POA: Diagnosis not present

## 2018-08-16 DIAGNOSIS — N186 End stage renal disease: Secondary | ICD-10-CM | POA: Diagnosis not present

## 2018-08-16 DIAGNOSIS — D689 Coagulation defect, unspecified: Secondary | ICD-10-CM | POA: Diagnosis not present

## 2018-08-16 DIAGNOSIS — N2581 Secondary hyperparathyroidism of renal origin: Secondary | ICD-10-CM | POA: Diagnosis not present

## 2018-08-18 DIAGNOSIS — N2581 Secondary hyperparathyroidism of renal origin: Secondary | ICD-10-CM | POA: Diagnosis not present

## 2018-08-18 DIAGNOSIS — D509 Iron deficiency anemia, unspecified: Secondary | ICD-10-CM | POA: Diagnosis not present

## 2018-08-18 DIAGNOSIS — Z992 Dependence on renal dialysis: Secondary | ICD-10-CM | POA: Diagnosis not present

## 2018-08-18 DIAGNOSIS — D689 Coagulation defect, unspecified: Secondary | ICD-10-CM | POA: Diagnosis not present

## 2018-08-18 DIAGNOSIS — N186 End stage renal disease: Secondary | ICD-10-CM | POA: Diagnosis not present

## 2018-08-18 DIAGNOSIS — I129 Hypertensive chronic kidney disease with stage 1 through stage 4 chronic kidney disease, or unspecified chronic kidney disease: Secondary | ICD-10-CM | POA: Diagnosis not present

## 2018-08-21 DIAGNOSIS — N2581 Secondary hyperparathyroidism of renal origin: Secondary | ICD-10-CM | POA: Diagnosis not present

## 2018-08-21 DIAGNOSIS — D509 Iron deficiency anemia, unspecified: Secondary | ICD-10-CM | POA: Diagnosis not present

## 2018-08-21 DIAGNOSIS — N186 End stage renal disease: Secondary | ICD-10-CM | POA: Diagnosis not present

## 2018-08-21 DIAGNOSIS — D689 Coagulation defect, unspecified: Secondary | ICD-10-CM | POA: Diagnosis not present

## 2018-08-21 DIAGNOSIS — Z992 Dependence on renal dialysis: Secondary | ICD-10-CM | POA: Diagnosis not present

## 2018-08-23 DIAGNOSIS — N186 End stage renal disease: Secondary | ICD-10-CM | POA: Diagnosis not present

## 2018-08-23 DIAGNOSIS — D509 Iron deficiency anemia, unspecified: Secondary | ICD-10-CM | POA: Diagnosis not present

## 2018-08-23 DIAGNOSIS — N2581 Secondary hyperparathyroidism of renal origin: Secondary | ICD-10-CM | POA: Diagnosis not present

## 2018-08-23 DIAGNOSIS — D689 Coagulation defect, unspecified: Secondary | ICD-10-CM | POA: Diagnosis not present

## 2018-08-23 DIAGNOSIS — Z992 Dependence on renal dialysis: Secondary | ICD-10-CM | POA: Diagnosis not present

## 2018-08-25 DIAGNOSIS — Z992 Dependence on renal dialysis: Secondary | ICD-10-CM | POA: Diagnosis not present

## 2018-08-25 DIAGNOSIS — N2581 Secondary hyperparathyroidism of renal origin: Secondary | ICD-10-CM | POA: Diagnosis not present

## 2018-08-25 DIAGNOSIS — D509 Iron deficiency anemia, unspecified: Secondary | ICD-10-CM | POA: Diagnosis not present

## 2018-08-25 DIAGNOSIS — D689 Coagulation defect, unspecified: Secondary | ICD-10-CM | POA: Diagnosis not present

## 2018-08-25 DIAGNOSIS — N186 End stage renal disease: Secondary | ICD-10-CM | POA: Diagnosis not present

## 2018-08-28 DIAGNOSIS — Z992 Dependence on renal dialysis: Secondary | ICD-10-CM | POA: Diagnosis not present

## 2018-08-28 DIAGNOSIS — D689 Coagulation defect, unspecified: Secondary | ICD-10-CM | POA: Diagnosis not present

## 2018-08-28 DIAGNOSIS — N2581 Secondary hyperparathyroidism of renal origin: Secondary | ICD-10-CM | POA: Diagnosis not present

## 2018-08-28 DIAGNOSIS — N186 End stage renal disease: Secondary | ICD-10-CM | POA: Diagnosis not present

## 2018-08-28 DIAGNOSIS — D509 Iron deficiency anemia, unspecified: Secondary | ICD-10-CM | POA: Diagnosis not present

## 2018-08-30 DIAGNOSIS — D509 Iron deficiency anemia, unspecified: Secondary | ICD-10-CM | POA: Diagnosis not present

## 2018-08-30 DIAGNOSIS — N186 End stage renal disease: Secondary | ICD-10-CM | POA: Diagnosis not present

## 2018-08-30 DIAGNOSIS — D689 Coagulation defect, unspecified: Secondary | ICD-10-CM | POA: Diagnosis not present

## 2018-08-30 DIAGNOSIS — N2581 Secondary hyperparathyroidism of renal origin: Secondary | ICD-10-CM | POA: Diagnosis not present

## 2018-08-30 DIAGNOSIS — Z992 Dependence on renal dialysis: Secondary | ICD-10-CM | POA: Diagnosis not present

## 2018-09-01 DIAGNOSIS — Z992 Dependence on renal dialysis: Secondary | ICD-10-CM | POA: Diagnosis not present

## 2018-09-01 DIAGNOSIS — N2581 Secondary hyperparathyroidism of renal origin: Secondary | ICD-10-CM | POA: Diagnosis not present

## 2018-09-01 DIAGNOSIS — D509 Iron deficiency anemia, unspecified: Secondary | ICD-10-CM | POA: Diagnosis not present

## 2018-09-01 DIAGNOSIS — N186 End stage renal disease: Secondary | ICD-10-CM | POA: Diagnosis not present

## 2018-09-01 DIAGNOSIS — D689 Coagulation defect, unspecified: Secondary | ICD-10-CM | POA: Diagnosis not present

## 2018-09-04 DIAGNOSIS — D509 Iron deficiency anemia, unspecified: Secondary | ICD-10-CM | POA: Diagnosis not present

## 2018-09-04 DIAGNOSIS — N186 End stage renal disease: Secondary | ICD-10-CM | POA: Diagnosis not present

## 2018-09-04 DIAGNOSIS — N2581 Secondary hyperparathyroidism of renal origin: Secondary | ICD-10-CM | POA: Diagnosis not present

## 2018-09-04 DIAGNOSIS — D689 Coagulation defect, unspecified: Secondary | ICD-10-CM | POA: Diagnosis not present

## 2018-09-04 DIAGNOSIS — Z992 Dependence on renal dialysis: Secondary | ICD-10-CM | POA: Diagnosis not present

## 2018-09-06 DIAGNOSIS — D689 Coagulation defect, unspecified: Secondary | ICD-10-CM | POA: Diagnosis not present

## 2018-09-06 DIAGNOSIS — D509 Iron deficiency anemia, unspecified: Secondary | ICD-10-CM | POA: Diagnosis not present

## 2018-09-06 DIAGNOSIS — N2581 Secondary hyperparathyroidism of renal origin: Secondary | ICD-10-CM | POA: Diagnosis not present

## 2018-09-06 DIAGNOSIS — Z992 Dependence on renal dialysis: Secondary | ICD-10-CM | POA: Diagnosis not present

## 2018-09-06 DIAGNOSIS — N186 End stage renal disease: Secondary | ICD-10-CM | POA: Diagnosis not present

## 2018-09-08 DIAGNOSIS — D509 Iron deficiency anemia, unspecified: Secondary | ICD-10-CM | POA: Diagnosis not present

## 2018-09-08 DIAGNOSIS — N2581 Secondary hyperparathyroidism of renal origin: Secondary | ICD-10-CM | POA: Diagnosis not present

## 2018-09-08 DIAGNOSIS — D689 Coagulation defect, unspecified: Secondary | ICD-10-CM | POA: Diagnosis not present

## 2018-09-08 DIAGNOSIS — N186 End stage renal disease: Secondary | ICD-10-CM | POA: Diagnosis not present

## 2018-09-08 DIAGNOSIS — Z992 Dependence on renal dialysis: Secondary | ICD-10-CM | POA: Diagnosis not present

## 2018-09-10 ENCOUNTER — Other Ambulatory Visit: Payer: Self-pay

## 2018-09-10 ENCOUNTER — Inpatient Hospital Stay (HOSPITAL_COMMUNITY)
Admission: EM | Admit: 2018-09-10 | Discharge: 2018-09-13 | DRG: 469 | Disposition: A | Discharge status: Home Health | Payer: Medicare Other | Attending: Internal Medicine | Admitting: Internal Medicine

## 2018-09-10 ENCOUNTER — Encounter (HOSPITAL_COMMUNITY): Payer: Self-pay | Admitting: *Deleted

## 2018-09-10 ENCOUNTER — Emergency Department (HOSPITAL_COMMUNITY): Payer: Medicare Other

## 2018-09-10 DIAGNOSIS — I10 Essential (primary) hypertension: Secondary | ICD-10-CM | POA: Diagnosis not present

## 2018-09-10 DIAGNOSIS — Z79899 Other long term (current) drug therapy: Secondary | ICD-10-CM | POA: Diagnosis not present

## 2018-09-10 DIAGNOSIS — Z8249 Family history of ischemic heart disease and other diseases of the circulatory system: Secondary | ICD-10-CM | POA: Diagnosis not present

## 2018-09-10 DIAGNOSIS — W1809XA Striking against other object with subsequent fall, initial encounter: Secondary | ICD-10-CM | POA: Diagnosis not present

## 2018-09-10 DIAGNOSIS — Z888 Allergy status to other drugs, medicaments and biological substances status: Secondary | ICD-10-CM

## 2018-09-10 DIAGNOSIS — N2581 Secondary hyperparathyroidism of renal origin: Secondary | ICD-10-CM | POA: Diagnosis present

## 2018-09-10 DIAGNOSIS — Z8679 Personal history of other diseases of the circulatory system: Secondary | ICD-10-CM

## 2018-09-10 DIAGNOSIS — S72012A Unspecified intracapsular fracture of left femur, initial encounter for closed fracture: Secondary | ICD-10-CM | POA: Diagnosis not present

## 2018-09-10 DIAGNOSIS — I129 Hypertensive chronic kidney disease with stage 1 through stage 4 chronic kidney disease, or unspecified chronic kidney disease: Secondary | ICD-10-CM | POA: Diagnosis present

## 2018-09-10 DIAGNOSIS — Y92009 Unspecified place in unspecified non-institutional (private) residence as the place of occurrence of the external cause: Secondary | ICD-10-CM | POA: Diagnosis not present

## 2018-09-10 DIAGNOSIS — J449 Chronic obstructive pulmonary disease, unspecified: Secondary | ICD-10-CM | POA: Diagnosis present

## 2018-09-10 DIAGNOSIS — Z7982 Long term (current) use of aspirin: Secondary | ICD-10-CM | POA: Diagnosis not present

## 2018-09-10 DIAGNOSIS — Z20828 Contact with and (suspected) exposure to other viral communicable diseases: Secondary | ICD-10-CM | POA: Diagnosis not present

## 2018-09-10 DIAGNOSIS — D62 Acute posthemorrhagic anemia: Secondary | ICD-10-CM | POA: Diagnosis not present

## 2018-09-10 DIAGNOSIS — I4510 Unspecified right bundle-branch block: Secondary | ICD-10-CM | POA: Diagnosis present

## 2018-09-10 DIAGNOSIS — N186 End stage renal disease: Secondary | ICD-10-CM | POA: Diagnosis not present

## 2018-09-10 DIAGNOSIS — E039 Hypothyroidism, unspecified: Secondary | ICD-10-CM | POA: Diagnosis present

## 2018-09-10 DIAGNOSIS — S72145A Nondisplaced intertrochanteric fracture of left femur, initial encounter for closed fracture: Secondary | ICD-10-CM

## 2018-09-10 DIAGNOSIS — Z79890 Hormone replacement therapy (postmenopausal): Secondary | ICD-10-CM

## 2018-09-10 DIAGNOSIS — E8889 Other specified metabolic disorders: Secondary | ICD-10-CM | POA: Diagnosis present

## 2018-09-10 DIAGNOSIS — W010XXA Fall on same level from slipping, tripping and stumbling without subsequent striking against object, initial encounter: Secondary | ICD-10-CM | POA: Diagnosis present

## 2018-09-10 DIAGNOSIS — R42 Dizziness and giddiness: Secondary | ICD-10-CM | POA: Diagnosis present

## 2018-09-10 DIAGNOSIS — I714 Abdominal aortic aneurysm, without rupture, unspecified: Secondary | ICD-10-CM | POA: Diagnosis present

## 2018-09-10 DIAGNOSIS — Z03818 Encounter for observation for suspected exposure to other biological agents ruled out: Secondary | ICD-10-CM | POA: Diagnosis not present

## 2018-09-10 DIAGNOSIS — D696 Thrombocytopenia, unspecified: Secondary | ICD-10-CM | POA: Diagnosis not present

## 2018-09-10 DIAGNOSIS — E785 Hyperlipidemia, unspecified: Secondary | ICD-10-CM | POA: Diagnosis present

## 2018-09-10 DIAGNOSIS — E8779 Other fluid overload: Secondary | ICD-10-CM | POA: Diagnosis present

## 2018-09-10 DIAGNOSIS — D631 Anemia in chronic kidney disease: Secondary | ICD-10-CM | POA: Diagnosis not present

## 2018-09-10 DIAGNOSIS — Z419 Encounter for procedure for purposes other than remedying health state, unspecified: Secondary | ICD-10-CM

## 2018-09-10 DIAGNOSIS — Z96642 Presence of left artificial hip joint: Secondary | ICD-10-CM | POA: Diagnosis not present

## 2018-09-10 DIAGNOSIS — F17210 Nicotine dependence, cigarettes, uncomplicated: Secondary | ICD-10-CM | POA: Diagnosis not present

## 2018-09-10 DIAGNOSIS — S72002A Fracture of unspecified part of neck of left femur, initial encounter for closed fracture: Secondary | ICD-10-CM | POA: Diagnosis not present

## 2018-09-10 DIAGNOSIS — Z992 Dependence on renal dialysis: Secondary | ICD-10-CM

## 2018-09-10 DIAGNOSIS — R0902 Hypoxemia: Secondary | ICD-10-CM | POA: Diagnosis not present

## 2018-09-10 DIAGNOSIS — S7292XA Unspecified fracture of left femur, initial encounter for closed fracture: Secondary | ICD-10-CM | POA: Diagnosis not present

## 2018-09-10 DIAGNOSIS — W19XXXA Unspecified fall, initial encounter: Secondary | ICD-10-CM

## 2018-09-10 DIAGNOSIS — I120 Hypertensive chronic kidney disease with stage 5 chronic kidney disease or end stage renal disease: Secondary | ICD-10-CM | POA: Diagnosis present

## 2018-09-10 DIAGNOSIS — Z471 Aftercare following joint replacement surgery: Secondary | ICD-10-CM | POA: Diagnosis not present

## 2018-09-10 DIAGNOSIS — S72092A Other fracture of head and neck of left femur, initial encounter for closed fracture: Secondary | ICD-10-CM | POA: Diagnosis not present

## 2018-09-10 DIAGNOSIS — S299XXA Unspecified injury of thorax, initial encounter: Secondary | ICD-10-CM | POA: Diagnosis not present

## 2018-09-10 LAB — CBC WITH DIFFERENTIAL/PLATELET
Abs Immature Granulocytes: 0.07 10*3/uL (ref 0.00–0.07)
Basophils Absolute: 0.1 10*3/uL (ref 0.0–0.1)
Basophils Relative: 1 %
Eosinophils Absolute: 0.5 10*3/uL (ref 0.0–0.5)
Eosinophils Relative: 4 %
HCT: 38.2 % (ref 36.0–46.0)
Hemoglobin: 12.8 g/dL (ref 12.0–15.0)
Immature Granulocytes: 1 %
Lymphocytes Relative: 13 %
Lymphs Abs: 1.7 10*3/uL (ref 0.7–4.0)
MCH: 32.5 pg (ref 26.0–34.0)
MCHC: 33.5 g/dL (ref 30.0–36.0)
MCV: 97 fL (ref 80.0–100.0)
Monocytes Absolute: 0.9 10*3/uL (ref 0.1–1.0)
Monocytes Relative: 7 %
Neutro Abs: 9.6 10*3/uL — ABNORMAL HIGH (ref 1.7–7.7)
Neutrophils Relative %: 74 %
Platelets: 167 10*3/uL (ref 150–400)
RBC: 3.94 MIL/uL (ref 3.87–5.11)
RDW: 13.2 % (ref 11.5–15.5)
WBC: 12.7 10*3/uL — ABNORMAL HIGH (ref 4.0–10.5)
nRBC: 0 % (ref 0.0–0.2)

## 2018-09-10 LAB — COMPREHENSIVE METABOLIC PANEL
ALT: 11 U/L (ref 0–44)
AST: 16 U/L (ref 15–41)
Albumin: 3.1 g/dL — ABNORMAL LOW (ref 3.5–5.0)
Alkaline Phosphatase: 83 U/L (ref 38–126)
Anion gap: 15 (ref 5–15)
BUN: 27 mg/dL — ABNORMAL HIGH (ref 8–23)
CO2: 28 mmol/L (ref 22–32)
Calcium: 9.3 mg/dL (ref 8.9–10.3)
Chloride: 94 mmol/L — ABNORMAL LOW (ref 98–111)
Creatinine, Ser: 7.23 mg/dL — ABNORMAL HIGH (ref 0.44–1.00)
GFR calc Af Amer: 6 mL/min — ABNORMAL LOW (ref >60)
GFR calc non Af Amer: 5 mL/min — ABNORMAL LOW (ref >60)
Glucose, Bld: 111 mg/dL — ABNORMAL HIGH (ref 70–99)
Potassium: 3.8 mmol/L (ref 3.5–5.1)
Sodium: 137 mmol/L (ref 135–145)
Total Bilirubin: 0.4 mg/dL (ref 0.3–1.2)
Total Protein: 6.7 g/dL (ref 6.5–8.1)

## 2018-09-10 MED ORDER — SODIUM CHLORIDE 0.9 % IV SOLN
250.0000 mL | INTRAVENOUS | Status: DC | PRN
  Administered 2018-09-11: 12:00:00 via INTRAVENOUS

## 2018-09-10 MED ORDER — MORPHINE SULFATE (PF) 4 MG/ML IV SOLN: 4.0000 mg | INTRAVENOUS | Status: DC | PRN

## 2018-09-10 MED ORDER — MORPHINE SULFATE (PF) 2 MG/ML IV SOLN
2.0000 mg | INTRAVENOUS | Status: DC | PRN
  Administered 2018-09-10: 2 mg via INTRAVENOUS
  Filled 2018-09-10: qty 1

## 2018-09-10 MED ORDER — SODIUM CHLORIDE 0.9% FLUSH
3.0000 mL | Freq: Two times a day (BID) | INTRAVENOUS | Status: DC
  Administered 2018-09-10: 3 mL via INTRAVENOUS

## 2018-09-10 MED ORDER — CHLORHEXIDINE GLUCONATE CLOTH 2 % EX PADS
6.0000 | MEDICATED_PAD | Freq: Every day | CUTANEOUS | Status: DC
  Administered 2018-09-11: 07:00:00 6 via TOPICAL

## 2018-09-10 MED ORDER — HEPARIN SODIUM (PORCINE) 5000 UNIT/ML IJ SOLN
5000.0000 [IU] | Freq: Three times a day (TID) | INTRAMUSCULAR | Status: DC
  Filled 2018-09-10: qty 1

## 2018-09-10 MED ORDER — SODIUM CHLORIDE 0.9% FLUSH: 3.0000 mL | INTRAVENOUS | Status: DC | PRN

## 2018-09-10 MED ORDER — HYDROMORPHONE HCL 1 MG/ML IJ SOLN
0.5000 mg | INTRAMUSCULAR | Status: DC | PRN
  Administered 2018-09-11: 08:00:00 0.5 mg via INTRAVENOUS

## 2018-09-10 NOTE — ED Notes (Addendum)
ED TO INPATIENT HANDOFF REPORT  ED Nurse Name and Phone #: William Hamburger, [place]  S Name/Age/Gender Kristin Gross 84 y.o. female Room/Bed: 023C/023C  Code Status   Code Status: Prior  Home/SNF/Other Home Patient oriented to: situation Is this baseline? No   Triage Complete: Triage complete  Chief Complaint HIP PAIN  Triage Note Pt went to answer the phone tonight, then turned around and ran into a coffee table causing her to fall. Pt c/o L posterior thigh. EMS VS 186/85, pulse 80   Allergies Allergies  Allergen Reactions  . Lisinopril Swelling    SWELLING REACTION UNSPECIFIED      Level of Care/Admitting Diagnosis ED Disposition    None      B Medical/Surgery History Past Medical History:  Diagnosis Date  . AAA (abdominal aortic aneurysm) ([place])   . Chronic kidney disease   . Hyperlipidemia   . Hypertension   . PONV (postoperative nausea and vomiting)   . Thyroid disease    Past Surgical History:  Procedure Laterality Date  . ABDOMINAL HYSTERECTOMY    . AORTA - BILATERAL FEMORAL ARTERY BYPASS GRAFT N/A 03/24/2017   Procedure: AORTO BI ILIAC BYPASS GRAFT;  Surgeon: Waynetta Sandy, MD;  Location: [facility];  Service: Vascular;  Laterality: N/A;  . AV FISTULA PLACEMENT Left 05/25/2017   Procedure: ARTERIOVENOUS (AV) FISTULA CREATION LEFT ARM;  Surgeon: Waynetta Sandy, MD;  Location: [facility];  Service: Vascular;  Laterality: Left;  . BASCILIC VEIN TRANSPOSITION Left 08/02/2017   Procedure: BASILIC VEIN TRANSPOSITION SECOND STAGE;  Surgeon: Waynetta Sandy, MD;  Location: [facility];  Service: Vascular;  Laterality: Left;  . CATARACT EXTRACTION, BILATERAL    . EYE SURGERY    . INSERTION OF DIALYSIS CATHETER Right 05/25/2017   Procedure: INSERTION OF TUNNELED  DIALYSIS CATHETER RIGHT INTERNAL JUGULAR;  Surgeon: Waynetta Sandy, MD;  Location: [facility];  Service: Vascular;  Laterality: Right;     A IV Location/Drains/Wounds Patient  Lines/Drains/Airways Status   Active Line/Drains/Airways    Name:   Placement date:   Placement time:   Site:   Days:   Peripheral IV 09/10/18 Right Antecubital   09/10/18    2006    Antecubital   less than 1   Fistula / Graft Left Upper arm Arteriovenous fistula   05/25/17    1222    Upper arm   473   Hemodialysis Catheter Right Internal jugular Double-lumen   05/25/17    1130    Internal jugular   473   Incision (Closed) 03/24/17 Abdomen Other (Comment)   03/24/17    1157     535   Incision (Closed) 05/25/17 Chest Right   05/25/17    1230     473   Incision (Closed) 08/02/17 Arm Left   08/02/17    0948     404          Intake/Output Last 24 hours No intake or output data in the 24 hours ending 09/10/18 2208  Labs/Imaging Results for orders placed or performed during the hospital encounter of 09/10/18 (from the past 48 hour(s))  CBC with Differential     Status: Abnormal   Collection Time: 09/10/18  8:06 PM  Result Value Ref Range   WBC 12.7 (H) 4.0 - 10.5 K/uL   RBC 3.94 3.87 - 5.11 MIL/uL   Hemoglobin 12.8 12.0 - 15.0 g/dL   HCT 38.2 36.0 - 46.0 %   MCV 97.0 80.0 -  100.0 fL   MCH 32.5 26.0 - 34.0 pg   MCHC 33.5 30.0 - 36.0 g/dL   RDW 13.2 11.5 - 15.5 %   Platelets 167 150 - 400 K/uL   nRBC 0.0 0.0 - 0.2 %   Neutrophils Relative % 74 %   Neutro Abs 9.6 (H) 1.7 - 7.7 K/uL   Lymphocytes Relative 13 %   Lymphs Abs 1.7 0.7 - 4.0 K/uL   Monocytes Relative 7 %   Monocytes Absolute 0.9 0.1 - 1.0 K/uL   Eosinophils Relative 4 %   Eosinophils Absolute 0.5 0.0 - 0.5 K/uL   Basophils Relative 1 %   Basophils Absolute 0.1 0.0 - 0.1 K/uL   Immature Granulocytes 1 %   Abs Immature Granulocytes 0.07 0.00 - 0.07 K/uL    Comment: Performed at [facility] [address] [place], [place] [address]  Comprehensive metabolic panel     Status: Abnormal   Collection Time: 09/10/18  8:06 PM  Result Value Ref Range   Sodium 137 135 - 145 mmol/L   Potassium 3.8 3.5 - 5.1 mmol/L    Chloride 94 (L) 98 - 111 mmol/L   CO2 28 22 - 32 mmol/L   Glucose, Bld 111 (H) 70 - 99 mg/dL   BUN 27 (H) 8 - 23 mg/dL   Creatinine, Ser 7.23 (H) 0.44 - 1.00 mg/dL   Calcium 9.3 8.9 - 10.3 mg/dL   Total Protein 6.7 6.5 - 8.1 g/dL   Albumin 3.1 (L) 3.5 - 5.0 g/dL   AST 16 15 - 41 U/L   ALT 11 0 - 44 U/L   Alkaline Phosphatase 83 38 - 126 U/L   Total Bilirubin 0.4 0.3 - 1.2 mg/dL   GFR calc non Af Amer 5 (L) >60 mL/min   GFR calc Af Amer 6 (L) >60 mL/min   Anion gap 15 5 - 15    Comment: Performed at [facility], [place] [address] [place], [place] [address]   Dg Chest 1 View  Result Date: 09/10/2018 CLINICAL DATA:  Fall EXAM: CHEST  1 VIEW COMPARISON:  08/07/2018 FINDINGS: Mild bilateral interstitial opacities, slightly worse on the left, unchanged from the prior study. No focal airspace consolidation. No pneumothorax. Small left pleural effusion. IMPRESSION: Mild interstitial pulmonary edema and small left pleural effusion, unchanged. Electronically Signed   By: [name] M.D.   On: 09/10/2018 21:31   Dg Hip Unilat With Pelvis 2-3 Views Left  Result Date: 09/10/2018 CLINICAL DATA:  84 year old female with left hip pain after fall. EXAM: DG HIP (WITH OR WITHOUT PELVIS) 2-3V LEFT COMPARISON:  CT Abdomen and Pelvis 03/09/2017. FINDINGS: There is an acute left femoral neck fracture with varus impaction. The intertrochanteric segment of the proximal left femur appears to remain intact. The left femoral head remains normally located. The pelvis appears intact. Grossly intact proximal right femur. Negative visible bowel gas pattern. Aortoiliac calcified atherosclerosis. IMPRESSION: Acute left femoral neck fracture with varus impaction. Electronically Signed   By: Genevie Ann M.D.   On: 09/10/2018 21:24   Dg Femur Min 2 Views Left  Result Date: 09/10/2018 CLINICAL DATA:  84 year old female with left hip fracture after fall. EXAM: LEFT FEMUR 2 VIEWS COMPARISON:  Left hip series today. FINDINGS:  Cross-table AP and lateral views of the mid and distal left femur are provided. No additional left femur fracture is identified. Alignment appears preserved at the left knee. No knee joint effusion is evident. The patella  appears intact. IMPRESSION: Mid and distal left femur remain intact. Electronically Signed   By: Genevie Ann M.D.   On: 09/10/2018 21:26    Pending Labs Unresulted Labs (From admission, onward)    Start     Ordered   09/10/18 2029  SARS CORONAVIRUS 2 Nasal Swab Aptima Multi Swab  (Asymptomatic/Tier 2 Patients Labs)  Once,   STAT    Question Answer Comment  Is this test for diagnosis or screening Screening   Symptomatic for COVID-19 as defined by CDC No   Hospitalized for COVID-19 No   Admitted to ICU for COVID-19 No   Previously tested for COVID-19 No   Resident in a congregate (group) care setting No   Employed in healthcare setting No   Pregnant No      09/10/18 2028          Vitals/Pain Today's Vitals   09/10/18 2015 09/10/18 2030 09/10/18 2045 09/10/18 2141  BP: (!) 194/72 (!) 193/76 (!) 191/70   Pulse: 77 77 79   Resp: (!) 24 18 (!) 22   Temp:      SpO2: 94% 96% 96%   Weight:      Height:      PainSc:    5     Isolation Precautions No active isolations  Medications Medications  HYDROmorphone (DILAUDID) injection 0.5 mg (has no administration in time range)  Chlorhexidine Gluconate Cloth 2 % PADS 6 each (has no administration in time range)    Mobility walks Moderate fall risk   Focused Assessments Renal Assessment Handoff:  Hemodialysis Schedule: Hemodialysis Schedule: Monday/Wednesday/Friday Last Hemodialysis date and time: 09/08/2018    Restricted appendage: left arm     R Recommendations: See Admitting Provider Note  Report given to:   Additional Notes: Fall at home;   ** Acute left femoral neck fracture with varus impaction; refuse pain meds.

## 2018-09-10 NOTE — ED Notes (Signed)
Husband's number added to pt contact, would like to be updated when given information. [phone]

## 2018-09-10 NOTE — ED Triage Notes (Signed)
Pt went to answer the phone tonight, then turned around and ran into a coffee table causing her to fall. Pt c/o L posterior thigh. EMS VS 186/85, pulse 80

## 2018-09-10 NOTE — ED Notes (Signed)
Patient transported to X-ray 

## 2018-09-10 NOTE — ED Notes (Signed)
Left hip pain sustained from a fall. Pt unable to move left leg due to pain. Slight lateral rotation to left foot. Unable to assess shorting. PMS intact.

## 2018-09-10 NOTE — ED Provider Notes (Signed)
[facility] EMERGENCY DEPARTMENT Provider Note   CSN: [id number] Arrival date & time: 09/10/18  1953     History   Chief Complaint Chief Complaint  Patient presents with  . Fall    HPI Kristin Gross is a 84 y.o. female.     84yo F w/ PMH including ESRD on HD M/W/F, AAA s/p repair, HTN, hypothyroidism who p/w L hip pain after a fall. Just PTA, pt got up to answer the phone and tripped and fell over her coffee table, landing on L hip.  She did not hit her head or lose consciousness but had an immediate onset of severe pain in her posterior upper left thigh/hip.  She has never injured this hip before.  No anticoagulant use.  She is compliant with dialysis.  The history is provided by the patient.  Fall    Past Medical History:  Diagnosis Date  . AAA (abdominal aortic aneurysm) ([place])   . Chronic kidney disease   . Hyperlipidemia   . Hypertension   . PONV (postoperative nausea and vomiting)   . Thyroid disease     Patient Active Problem List   Diagnosis Date Noted  . Prolonged QT interval 08/07/2018  . Hypertension with fluid overload 08/07/2018  . Hypothyroidism 08/07/2018  . ESRD on dialysis ([place]) 08/26/2017  . AAA (abdominal aortic aneurysm) ([place]) 03/24/2017  . Preoperative cardiovascular examination 03/14/2017  . Hypertensive chronic kidney disease 03/14/2017  . AAA (abdominal aortic aneurysm) without rupture ([place]) 03/14/2017    Past Surgical History:  Procedure Laterality Date  . ABDOMINAL HYSTERECTOMY    . AORTA - BILATERAL FEMORAL ARTERY BYPASS GRAFT N/A 03/24/2017   Procedure: AORTO BI ILIAC BYPASS GRAFT;  Surgeon: Waynetta Sandy, MD;  Location: [facility];  Service: Vascular;  Laterality: N/A;  . AV FISTULA PLACEMENT Left 05/25/2017   Procedure: ARTERIOVENOUS (AV) FISTULA CREATION LEFT ARM;  Surgeon: Waynetta Sandy, MD;  Location: [place];  Service: Vascular;  Laterality: Left;  . BASCILIC VEIN TRANSPOSITION Left 08/02/2017   Procedure: BASILIC VEIN TRANSPOSITION SECOND STAGE;  Surgeon: Waynetta Sandy, MD;  Location: [place];  Service: Vascular;  Laterality: Left;  . CATARACT EXTRACTION, BILATERAL    . EYE SURGERY    . INSERTION OF DIALYSIS CATHETER Right 05/25/2017   Procedure: INSERTION OF TUNNELED  DIALYSIS CATHETER RIGHT INTERNAL JUGULAR;  Surgeon: Waynetta Sandy, MD;  Location: [place];  Service: Vascular;  Laterality: Right;     OB History   No obstetric history on file.      Home Medications    Prior to Admission medications   Medication Sig Start Date End Date Taking? Authorizing Provider  aspirin 81 MG tablet Take 81 mg by mouth at bedtime.     [provider]  cholecalciferol (VITAMIN D) 1000 units tablet Take 1,000 Units by mouth daily.    [provider]  ferric citrate (AURYXIA) 1 GM 210 MG(Fe) tablet Take 210 mg by mouth daily.    [provider]  levothyroxine (SYNTHROID, LEVOTHROID) 100 MCG tablet Take 100 mcg by mouth daily before breakfast.  06/17/17   [provider]  metoprolol tartrate (LOPRESSOR) 50 MG tablet Take 50 mg by mouth daily.     [provider]  rosuvastatin (CRESTOR) 10 MG tablet Take 10 mg by mouth daily.     [provider]    Family History Family History  Problem Relation Age of Onset  . Heart disease Sister   .  Heart attack Sister   . Hypertension Sister   . Cancer Mother     Social History Social History   Tobacco Use  . Smoking status: Current Some Day Smoker    Packs/day: 1.00    Years: 60.00    Pack years: 60.00    Types: Cigarettes  . Smokeless tobacco: Never Used  Substance Use Topics  . Alcohol use: No    Frequency: Never  . Drug use: No     Allergies   Lisinopril   Review of Systems Review of Systems All other systems reviewed and are negative except that which was mentioned in HPI   Physical Exam Updated Vital Signs BP (!) 213/82   Pulse 83   Temp 97.9 F (36.6  C)   Resp 16   Ht 5\' 5"  (1.651 m)   Wt 54.4 kg   SpO2 95%   BMI 19.97 kg/m   Physical Exam Vitals signs and nursing note reviewed.  Constitutional:      General: She is not in acute distress.    Appearance: She is well-developed.  HENT:     Head: Normocephalic and atraumatic.  Eyes:     Conjunctiva/sclera: Conjunctivae normal.  Neck:     Musculoskeletal: Neck supple.  Cardiovascular:     Rate and Rhythm: Normal rate and regular rhythm.     Pulses: Normal pulses.     Heart sounds: Normal heart sounds. No murmur.  Pulmonary:     Effort: Pulmonary effort is normal.     Breath sounds: Normal breath sounds.  Abdominal:     General: Bowel sounds are normal. There is no distension.     Palpations: Abdomen is soft.     Tenderness: There is no abdominal tenderness.  Musculoskeletal:     Comments: L upper arm AV fistula; L leg shortened compared to R with posterior upper thigh tenderness  Skin:    General: Skin is warm and dry.  Neurological:     Mental Status: She is alert and oriented to person, place, and time.     Sensory: No sensory deficit.     Comments: Fluent speech  Psychiatric:        Judgment: Judgment normal.      ED Treatments / Results  Labs (all labs ordered are listed, but only abnormal results are displayed) Labs Reviewed  CBC WITH DIFFERENTIAL/PLATELET - Abnormal; Notable for the following components:      Result Value   WBC 12.7 (*)    Neutro Abs 9.6 (*)    All other components within normal limits  COMPREHENSIVE METABOLIC PANEL - Abnormal; Notable for the following components:   Chloride 94 (*)    Glucose, Bld 111 (*)    BUN 27 (*)    Creatinine, Ser 7.23 (*)    Albumin 3.1 (*)    GFR calc non Af Amer 5 (*)    GFR calc Af Amer 6 (*)    All other components within normal limits  SARS CORONAVIRUS 2    EKG None  Radiology Dg Chest 1 View  Result Date: 09/10/2018 CLINICAL DATA:  Fall EXAM: CHEST  1 VIEW COMPARISON:  08/07/2018 FINDINGS:  Mild bilateral interstitial opacities, slightly worse on the left, unchanged from the prior study. No focal airspace consolidation. No pneumothorax. Small left pleural effusion. IMPRESSION: Mild interstitial pulmonary edema and small left pleural effusion, unchanged. Electronically Signed   By: [name] M.D.   On: 09/10/2018 21:31   Dg Hip Unilat With  Pelvis 2-3 Views Left  Result Date: 09/10/2018 CLINICAL DATA:  84 year old female with left hip pain after fall. EXAM: DG HIP (WITH OR WITHOUT PELVIS) 2-3V LEFT COMPARISON:  CT Abdomen and Pelvis 03/09/2017. FINDINGS: There is an acute left femoral neck fracture with varus impaction. The intertrochanteric segment of the proximal left femur appears to remain intact. The left femoral head remains normally located. The pelvis appears intact. Grossly intact proximal right femur. Negative visible bowel gas pattern. Aortoiliac calcified atherosclerosis. IMPRESSION: Acute left femoral neck fracture with varus impaction. Electronically Signed   By: Genevie Ann M.D.   On: 09/10/2018 21:24   Dg Femur Min 2 Views Left  Result Date: 09/10/2018 CLINICAL DATA:  84 year old female with left hip fracture after fall. EXAM: LEFT FEMUR 2 VIEWS COMPARISON:  Left hip series today. FINDINGS: Cross-table AP and lateral views of the mid and distal left femur are provided. No additional left femur fracture is identified. Alignment appears preserved at the left knee. No knee joint effusion is evident. The patella appears intact. IMPRESSION: Mid and distal left femur remain intact. Electronically Signed   By: Genevie Ann M.D.   On: 09/10/2018 21:26    Procedures Procedures (including critical care time)  Medications Ordered in ED Medications - No data to display   Initial Impression / Assessment and Plan / ED Course  I have reviewed the triage vital signs and the nursing notes.  Pertinent labs & imaging results that were available during my care of the patient were reviewed by  me and considered in my medical decision making (see chart for details).       Neurovascularly intact with no other areas of pain aside from L hip.  Managing shows a left intertrochanteric hip fracture.  Discussed with orthopedics, Dr. Griffin Basil. Their team will tentatively plan for repair tomorrow afternoon if medically clear. Given pt is due for dialysis, discussed w/ nephrology, Dr. Jonnie Finner, who has added to dialysis list. K is normal and pt on room air currently.   Discussed admission with [facility], Dr. Jonelle Sidle. Pt and her husband updated on plan.  Final Clinical Impressions(s) / ED Diagnoses   Final diagnoses:  Closed nondisplaced intertrochanteric fracture of left femur, initial encounter [facility])  ESRD on hemodialysis [facility])    ED Discharge Orders    None       Little, Wenda Overland, MD 09/10/18 2309

## 2018-09-10 NOTE — H&P (Signed)
History and Physical   Kristin Gross KGU:[id number] DOB: [birth year] DOA: 09/10/2018  Referring MD/NP/PA: Dr Rex Kras  PCP: [name], MD   Outpatient Specialists: Nephrology   Patient coming from: Home  Chief Complaint: Fall with left hip injury  HPI: Kristin Gross is a 84 y.o. female with medical history significant of end-stage renal disease on hemodialysis Mondays Wednesdays and Fridays, hypertension, hyperlipidemia, hypothyroidism, abdominal aortic aneurysm who sustained a mechanical fall at home today.  Patient was sitting in the chair and got up to answer the phone and tripped and fell over her coffee table landing on the left hip.  She denied hitting her head or losing consciousness.  She felt immediate pain in her left hip area.  Was brought to the ER where she was found to have left femoral neck fracture.  She is otherwise hemodynamically stable.  She is due for hemodialysis tomorrow.  Patient's pain is controlled not it was 8 out of 10 in the beginning.  Orthopedic surgery has been consulted and patient is being admitted for surgical repair.  She denied any other injury..  ED Course: Temperature is 97.9 blood pressure 213/82 pulse 83 respirate of 24 oxygen sat 94% room air White count is 12.7 hemoglobin 12.8 and platelets 167.  Sodium 137 potassium 3.8 chloride 94 CO2 28 with BUN 27 creatinine 7.23.  COVID-19 testing is pending.  Chest x-ray shows mild interstitial pulmonary edema and small left pleural effusion, x-ray of the left hip shows acute left femoral neck fracture with varus impaction.  X-ray of the distal femur showed no fracture.  Patient being admitted for surgical repair once medically cleared  Review of Systems: As per HPI otherwise 10 point review of systems negative.    Past Medical History:  Diagnosis Date  . AAA (abdominal aortic aneurysm) ([place])   . Chronic kidney disease   . Hyperlipidemia   . Hypertension   . PONV (postoperative nausea and vomiting)   .  Thyroid disease     Past Surgical History:  Procedure Laterality Date  . ABDOMINAL HYSTERECTOMY    . AORTA - BILATERAL FEMORAL ARTERY BYPASS GRAFT N/A 03/24/2017   Procedure: AORTO BI ILIAC BYPASS GRAFT;  Surgeon: Waynetta Sandy, MD;  Location: [place];  Service: Vascular;  Laterality: N/A;  . AV FISTULA PLACEMENT Left 05/25/2017   Procedure: ARTERIOVENOUS (AV) FISTULA CREATION LEFT ARM;  Surgeon: Waynetta Sandy, MD;  Location: [place];  Service: Vascular;  Laterality: Left;  . BASCILIC VEIN TRANSPOSITION Left 08/02/2017   Procedure: BASILIC VEIN TRANSPOSITION SECOND STAGE;  Surgeon: Waynetta Sandy, MD;  Location: [place];  Service: Vascular;  Laterality: Left;  . CATARACT EXTRACTION, BILATERAL    . EYE SURGERY    . INSERTION OF DIALYSIS CATHETER Right 05/25/2017   Procedure: INSERTION OF TUNNELED  DIALYSIS CATHETER RIGHT INTERNAL JUGULAR;  Surgeon: Waynetta Sandy, MD;  Location: [place];  Service: Vascular;  Laterality: Right;     reports that she has been smoking cigarettes. She has a 60.00 pack-year smoking history. She has never used smokeless tobacco. She reports that she does not drink alcohol or use drugs.  Allergies  Allergen Reactions  . Lisinopril Swelling    SWELLING REACTION UNSPECIFIED      Family History  Problem Relation Age of Onset  . Heart disease Sister   . Heart attack Sister   . Hypertension Sister   . Cancer Mother      Prior to Admission medications  Medication Sig Start Date End Date Taking? Authorizing Provider  aspirin 81 MG tablet Take 81 mg by mouth at bedtime.     [provider]  cholecalciferol (VITAMIN D) 1000 units tablet Take 1,000 Units by mouth daily.    [provider]  ferric citrate (AURYXIA) 1 GM 210 MG(Fe) tablet Take 210 mg by mouth daily.    [provider]  levothyroxine (SYNTHROID, LEVOTHROID) 100 MCG tablet Take 100 mcg by mouth daily before breakfast.  06/17/17   [provider]  metoprolol tartrate (LOPRESSOR) 50 MG tablet Take 50 mg by mouth daily.     [provider]  rosuvastatin (CRESTOR) 10 MG tablet Take 10 mg by mouth daily.     [provider]    Physical Exam: Vitals:   09/10/18 2008 09/10/18 2015 09/10/18 2030 09/10/18 2045  BP:  (!) 194/72 (!) 193/76 (!) 191/70  Pulse:  77 77 79  Resp:  (!) 24 18 (!) 22  Temp:      SpO2:  94% 96% 96%  Weight: 54.4 kg     Height: 5\' 5"  (1.651 m)         Constitutional: NAD, calm, uncomfortable Vitals:   09/10/18 2008 09/10/18 2015 09/10/18 2030 09/10/18 2045  BP:  (!) 194/72 (!) 193/76 (!) 191/70  Pulse:  77 77 79  Resp:  (!) 24 18 (!) 22  Temp:      SpO2:  94% 96% 96%  Weight: 54.4 kg     Height: 5\' 5"  (1.651 m)      Eyes: PERRL, lids and conjunctivae normal ENMT: Mucous membranes are moist. Posterior pharynx clear of any exudate or lesions.Normal dentition.  Neck: normal, supple, no masses, no thyromegaly Respiratory: Good air entry with bilateral crackles. Normal respiratory effort. No accessory muscle use.  Cardiovascular: Regular rate and rhythm, no murmurs / rubs / gallops. No extremity edema. 2+ pedal pulses. No carotid bruits.  Abdomen: no tenderness, no masses palpated. No hepatosplenomegaly. Bowel sounds positive.  Musculoskeletal: Bruises on the left hip area with shortening and lateral rotation of the left lower extremity, decreased range of motion normal muscle tone.  Skin: no rashes, lesions, ulcers. No induration Neurologic: CN 2-12 grossly intact. Sensation intact, DTR normal. Strength 5/5 in all 4.  Psychiatric: Normal judgment and insight. Alert and oriented x 3. Normal mood.     Labs on Admission: I have personally reviewed following labs and imaging studies  CBC: Recent Labs  Lab 09/10/18 2006  WBC 12.7*  NEUTROABS 9.6*  HGB 12.8  HCT 38.2  MCV 97.0  PLT 196   Basic Metabolic Panel: Recent Labs  Lab 09/10/18 2006  NA 137  K 3.8  CL  94*  CO2 28  GLUCOSE 111*  BUN 27*  CREATININE 7.23*  CALCIUM 9.3   GFR: Estimated Creatinine Clearance: 5.2 mL/min (A) (by C-G formula based on SCr of 7.23 mg/dL (H)). Liver Function Tests: Recent Labs  Lab 09/10/18 2006  AST 16  ALT 11  ALKPHOS 83  BILITOT 0.4  PROT 6.7  ALBUMIN 3.1*   No results for input(s): LIPASE, AMYLASE in the last 168 hours. No results for input(s): AMMONIA in the last 168 hours. Coagulation Profile: No results for input(s): INR, PROTIME in the last 168 hours. Cardiac Enzymes: No results for input(s): CKTOTAL, CKMB, CKMBINDEX, TROPONINI in the last 168 hours. BNP (last 3 results) No results for input(s): PROBNP in the last 8760 hours. HbA1C: No results for input(s): HGBA1C  in the last 72 hours. CBG: No results for input(s): GLUCAP in the last 168 hours. Lipid Profile: No results for input(s): CHOL, HDL, LDLCALC, TRIG, CHOLHDL, LDLDIRECT in the last 72 hours. Thyroid Function Tests: No results for input(s): TSH, T4TOTAL, FREET4, T3FREE, THYROIDAB in the last 72 hours. Anemia Panel: No results for input(s): VITAMINB12, FOLATE, FERRITIN, TIBC, IRON, RETICCTPCT in the last 72 hours. Urine analysis:    Component Value Date/Time   COLORURINE YELLOW 03/21/2017 1344   APPEARANCEUR HAZY (A) 03/21/2017 1344   LABSPEC 1.014 03/21/2017 1344   PHURINE 5.0 03/21/2017 1344   GLUCOSEU NEGATIVE 03/21/2017 1344   HGBUR SMALL (A) 03/21/2017 1344   BILIRUBINUR NEGATIVE 03/21/2017 1344   KETONESUR NEGATIVE 03/21/2017 1344   PROTEINUR 30 (A) 03/21/2017 1344   NITRITE NEGATIVE 03/21/2017 1344   LEUKOCYTESUR TRACE (A) 03/21/2017 1344   Sepsis Labs: @LABRCNTIP (procalcitonin:4,lacticidven:4) )No results found for this or any previous visit (from the past 240 hour(s)).   Radiological Exams on Admission: Dg Chest 1 View  Result Date: 09/10/2018 CLINICAL DATA:  Fall EXAM: CHEST  1 VIEW COMPARISON:  08/07/2018 FINDINGS: Mild bilateral interstitial opacities,  slightly worse on the left, unchanged from the prior study. No focal airspace consolidation. No pneumothorax. Small left pleural effusion. IMPRESSION: Mild interstitial pulmonary edema and small left pleural effusion, unchanged. Electronically Signed   By: [name] M.D.   On: 09/10/2018 21:31   Dg Hip Unilat With Pelvis 2-3 Views Left  Result Date: 09/10/2018 CLINICAL DATA:  84 year old female with left hip pain after fall. EXAM: DG HIP (WITH OR WITHOUT PELVIS) 2-3V LEFT COMPARISON:  CT Abdomen and Pelvis 03/09/2017. FINDINGS: There is an acute left femoral neck fracture with varus impaction. The intertrochanteric segment of the proximal left femur appears to remain intact. The left femoral head remains normally located. The pelvis appears intact. Grossly intact proximal right femur. Negative visible bowel gas pattern. Aortoiliac calcified atherosclerosis. IMPRESSION: Acute left femoral neck fracture with varus impaction. Electronically Signed   By: Genevie Ann M.D.   On: 09/10/2018 21:24   Dg Femur Min 2 Views Left  Result Date: 09/10/2018 CLINICAL DATA:  84 year old female with left hip fracture after fall. EXAM: LEFT FEMUR 2 VIEWS COMPARISON:  Left hip series today. FINDINGS: Cross-table AP and lateral views of the mid and distal left femur are provided. No additional left femur fracture is identified. Alignment appears preserved at the left knee. No knee joint effusion is evident. The patella appears intact. IMPRESSION: Mid and distal left femur remain intact. Electronically Signed   By: Genevie Ann M.D.   On: 09/10/2018 21:26    Assessment/Plan Principal Problem:   Closed left femoral fracture (HCC) Active Problems:   Hypertensive chronic kidney disease   AAA (abdominal aortic aneurysm) without rupture ([facility])   ESRD on dialysis ([place])   Hypertension with fluid overload   Hypothyroidism     #1 left femoral neck fracture: Patient will be admitted.  She will be getting surgical repair after  medical clearance.  Hemodialysis in the morning.  Otherwise appears to be medically stable for repair.  Will get EKG and possibly echocardiogram.  #2 end-stage renal disease: Patient is scheduled for hemodialysis tomorrow.  Nephrology already consulted.  We will continue to monitor  #3 hypertension: Not completely controlled.  Resume home regimen.  Hemodialysis in the morning to assist.  #4 hypothyroidism: Continue levothyroxine  #5 leukocytosis: Probably due to inflammation.  Follow white count closely.   DVT prophylaxis: Heparin Code Status:  Full code Family Communication: No family at bedside Disposition Plan: To be determined Consults called: Orthopedic surgery Dr. Sallye Lat, Dr. Harvin Hazel, nephrology Admission status: Inpatient  Severity of Illness: The appropriate patient status for this patient is INPATIENT. Inpatient status is judged to be reasonable and necessary in order to provide the required intensity of service to ensure the patient's safety. The patient's presenting symptoms, physical exam findings, and initial radiographic and laboratory data in the context of their chronic comorbidities is felt to place them at high risk for further clinical deterioration. Furthermore, it is not anticipated that the patient will be medically stable for discharge from the hospital within 2 midnights of admission. The following factors support the patient status of inpatient.   " The patient's presenting symptoms include fall with left hip pain. " The worrisome physical exam findings include shortening of the left lower extremity. " The initial radiographic and laboratory data are worrisome because of x-ray showing fracture of the left femoral neck. " The chronic co-morbidities include end-stage renal disease.   * I certify that at the point of admission it is my clinical judgment that the patient will require inpatient hospital care spanning beyond 2 midnights from the point of admission due to  high intensity of service, high risk for further deterioration and high frequency of surveillance required.Barbette Merino MD [facility] Pager [phone]  If 7PM-7AM, please contact night-coverage [email or web address] Password [facility]  09/10/2018, 10:38 PM

## 2018-09-11 ENCOUNTER — Inpatient Hospital Stay (HOSPITAL_COMMUNITY): Payer: Medicare Other

## 2018-09-11 ENCOUNTER — Inpatient Hospital Stay (HOSPITAL_COMMUNITY): Payer: Medicare Other | Admitting: Certified Registered Nurse Anesthetist

## 2018-09-11 ENCOUNTER — Other Ambulatory Visit: Payer: Self-pay

## 2018-09-11 ENCOUNTER — Encounter (HOSPITAL_COMMUNITY): Payer: Self-pay | Admitting: Certified Registered Nurse Anesthetist

## 2018-09-11 ENCOUNTER — Encounter (HOSPITAL_COMMUNITY)
Admission: EM | Disposition: A | Discharge status: Home Health | Payer: Self-pay | Source: Home / Self Care | Attending: Internal Medicine

## 2018-09-11 DIAGNOSIS — I129 Hypertensive chronic kidney disease with stage 1 through stage 4 chronic kidney disease, or unspecified chronic kidney disease: Secondary | ICD-10-CM

## 2018-09-11 HISTORY — PX: ANTERIOR APPROACH HEMI HIP ARTHROPLASTY: SHX6690

## 2018-09-11 LAB — COMPREHENSIVE METABOLIC PANEL
ALT: 12 U/L (ref 0–44)
AST: 21 U/L (ref 15–41)
Albumin: 2.9 g/dL — ABNORMAL LOW (ref 3.5–5.0)
Alkaline Phosphatase: 66 U/L (ref 38–126)
Anion gap: 12 (ref 5–15)
BUN: 29 mg/dL — ABNORMAL HIGH (ref 8–23)
CO2: 26 mmol/L (ref 22–32)
Calcium: 9.1 mg/dL (ref 8.9–10.3)
Chloride: 98 mmol/L (ref 98–111)
Creatinine, Ser: 7.9 mg/dL — ABNORMAL HIGH (ref 0.44–1.00)
GFR calc Af Amer: 5 mL/min — ABNORMAL LOW (ref >60)
GFR calc non Af Amer: 4 mL/min — ABNORMAL LOW (ref >60)
Glucose, Bld: 103 mg/dL — ABNORMAL HIGH (ref 70–99)
Potassium: 4 mmol/L (ref 3.5–5.1)
Sodium: 136 mmol/L (ref 135–145)
Total Bilirubin: 0.7 mg/dL (ref 0.3–1.2)
Total Protein: 5.8 g/dL — ABNORMAL LOW (ref 6.5–8.1)

## 2018-09-11 LAB — CBC
HCT: 36.4 % (ref 36.0–46.0)
Hemoglobin: 12.4 g/dL (ref 12.0–15.0)
MCH: 32.7 pg (ref 26.0–34.0)
MCHC: 34.1 g/dL (ref 30.0–36.0)
MCV: 96 fL (ref 80.0–100.0)
Platelets: 154 10*3/uL (ref 150–400)
RBC: 3.79 MIL/uL — ABNORMAL LOW (ref 3.87–5.11)
RDW: 13.2 % (ref 11.5–15.5)
WBC: 11.6 10*3/uL — ABNORMAL HIGH (ref 4.0–10.5)
nRBC: 0 % (ref 0.0–0.2)

## 2018-09-11 LAB — SARS CORONAVIRUS 2 (TAT 6-24 HRS): SARS Coronavirus 2: NEGATIVE

## 2018-09-11 LAB — MRSA PCR SCREENING: MRSA by PCR: NEGATIVE

## 2018-09-11 SURGERY — HEMIARTHROPLASTY, HIP, DIRECT ANTERIOR APPROACH, FOR FRACTURE
Anesthesia: General | Site: Hip | Laterality: Left

## 2018-09-11 MED ORDER — HYDROCODONE-ACETAMINOPHEN 5-325 MG PO TABS: 1.0000 | ORAL_TABLET | ORAL | Status: DC | PRN

## 2018-09-11 MED ORDER — ONDANSETRON HCL 4 MG PO TABS: 4.0000 mg | ORAL_TABLET | Freq: Four times a day (QID) | ORAL | Status: DC | PRN

## 2018-09-11 MED ORDER — ALBUMIN HUMAN 5 % IV SOLN
INTRAVENOUS | Status: DC | PRN
  Administered 2018-09-11: 12:00:00 via INTRAVENOUS

## 2018-09-11 MED ORDER — KETOROLAC TROMETHAMINE 30 MG/ML IJ SOLN
INTRAMUSCULAR | Status: AC
  Filled 2018-09-11: qty 1

## 2018-09-11 MED ORDER — LIDOCAINE HCL (CARDIAC) PF 100 MG/5ML IV SOSY
PREFILLED_SYRINGE | INTRAVENOUS | Status: DC | PRN
  Administered 2018-09-11: 60 mg via INTRAVENOUS

## 2018-09-11 MED ORDER — 0.9 % SODIUM CHLORIDE (POUR BTL) OPTIME
TOPICAL | Status: DC | PRN
  Administered 2018-09-11: 1000 mL

## 2018-09-11 MED ORDER — NICOTINE 14 MG/24HR TD PT24
14.0000 mg | MEDICATED_PATCH | Freq: Every day | TRANSDERMAL | Status: DC
  Administered 2018-09-11 – 2018-09-12 (×2): 14 mg via TRANSDERMAL
  Filled 2018-09-11 (×2): qty 1

## 2018-09-11 MED ORDER — PROMETHAZINE HCL 25 MG/ML IJ SOLN: 12.5000 mg | Freq: Four times a day (QID) | INTRAMUSCULAR | Status: DC | PRN

## 2018-09-11 MED ORDER — SUCCINYLCHOLINE CHLORIDE 200 MG/10ML IV SOSY
PREFILLED_SYRINGE | INTRAVENOUS | Status: AC
  Filled 2018-09-11: qty 10

## 2018-09-11 MED ORDER — SODIUM CHLORIDE 0.9 % IV SOLN
Freq: Once | INTRAVENOUS | Status: AC
  Administered 2018-09-11: 11:00:00 via INTRAVENOUS

## 2018-09-11 MED ORDER — METOPROLOL TARTRATE 50 MG PO TABS
50.0000 mg | ORAL_TABLET | Freq: Once | ORAL | Status: AC
  Administered 2018-09-11: 11:00:00 50 mg via ORAL

## 2018-09-11 MED ORDER — HYDROMORPHONE HCL 1 MG/ML IJ SOLN
INTRAMUSCULAR | Status: DC | PRN
  Administered 2018-09-11: 0.5 mg via INTRAVENOUS

## 2018-09-11 MED ORDER — FENTANYL CITRATE (PF) 100 MCG/2ML IJ SOLN: 25.0000 ug | INTRAMUSCULAR | Status: DC | PRN

## 2018-09-11 MED ORDER — FENTANYL CITRATE (PF) 250 MCG/5ML IJ SOLN
INTRAMUSCULAR | Status: DC | PRN
  Administered 2018-09-11: 100 ug via INTRAVENOUS
  Administered 2018-09-11 (×3): 50 ug via INTRAVENOUS

## 2018-09-11 MED ORDER — PHENYLEPHRINE 40 MCG/ML (10ML) SYRINGE FOR IV PUSH (FOR BLOOD PRESSURE SUPPORT)
PREFILLED_SYRINGE | INTRAVENOUS | Status: DC | PRN
  Administered 2018-09-11: 80 ug via INTRAVENOUS
  Administered 2018-09-11 (×2): 120 ug via INTRAVENOUS

## 2018-09-11 MED ORDER — ONDANSETRON HCL 4 MG/2ML IJ SOLN
INTRAMUSCULAR | Status: AC
  Filled 2018-09-11: qty 2

## 2018-09-11 MED ORDER — HYDROCODONE-ACETAMINOPHEN 7.5-325 MG PO TABS: 1.0000 | ORAL_TABLET | ORAL | Status: DC | PRN

## 2018-09-11 MED ORDER — ROCURONIUM BROMIDE 10 MG/ML (PF) SYRINGE
PREFILLED_SYRINGE | INTRAVENOUS | Status: AC
  Filled 2018-09-11: qty 10

## 2018-09-11 MED ORDER — BUPIVACAINE-EPINEPHRINE (PF) 0.5% -1:200000 IJ SOLN
INTRAMUSCULAR | Status: AC
  Filled 2018-09-11: qty 30

## 2018-09-11 MED ORDER — FENTANYL CITRATE (PF) 250 MCG/5ML IJ SOLN
INTRAMUSCULAR | Status: AC
  Filled 2018-09-11: qty 5

## 2018-09-11 MED ORDER — ONDANSETRON HCL 4 MG/2ML IJ SOLN
INTRAMUSCULAR | Status: DC | PRN
  Administered 2018-09-11: 4 mg via INTRAVENOUS

## 2018-09-11 MED ORDER — MENTHOL 3 MG MT LOZG: 1.0000 | LOZENGE | OROMUCOSAL | Status: DC | PRN

## 2018-09-11 MED ORDER — PHENOL 1.4 % MT LIQD: 1.0000 | OROMUCOSAL | Status: DC | PRN

## 2018-09-11 MED ORDER — DEXAMETHASONE SODIUM PHOSPHATE 10 MG/ML IJ SOLN
INTRAMUSCULAR | Status: AC
  Filled 2018-09-11: qty 1

## 2018-09-11 MED ORDER — SODIUM CHLORIDE 0.9 % IV SOLN
INTRAVENOUS | Status: DC | PRN
  Administered 2018-09-11: 50 ug/min via INTRAVENOUS

## 2018-09-11 MED ORDER — SODIUM CHLORIDE (PF) 0.9 % IJ SOLN
INTRAMUSCULAR | Status: DC | PRN
  Administered 2018-09-11: 29 mL via INTRAVENOUS

## 2018-09-11 MED ORDER — KETOROLAC TROMETHAMINE 30 MG/ML IJ SOLN
INTRAMUSCULAR | Status: DC | PRN
  Administered 2018-09-11: 30 mg via INTRAVENOUS

## 2018-09-11 MED ORDER — PROPOFOL 10 MG/ML IV BOLUS
INTRAVENOUS | Status: DC | PRN
  Administered 2018-09-11: 100 mg via INTRAVENOUS

## 2018-09-11 MED ORDER — SUCCINYLCHOLINE CHLORIDE 200 MG/10ML IV SOSY
PREFILLED_SYRINGE | INTRAVENOUS | Status: DC | PRN
  Administered 2018-09-11: 60 mg via INTRAVENOUS

## 2018-09-11 MED ORDER — METOPROLOL TARTRATE 50 MG PO TABS
ORAL_TABLET | ORAL | Status: AC
  Filled 2018-09-11: qty 1

## 2018-09-11 MED ORDER — CEFAZOLIN SODIUM-DEXTROSE 2-3 GM-%(50ML) IV SOLR
INTRAVENOUS | Status: DC | PRN
  Administered 2018-09-11: 2 g via INTRAVENOUS

## 2018-09-11 MED ORDER — PRO-STAT SUGAR FREE PO LIQD
30.0000 mL | Freq: Two times a day (BID) | ORAL | Status: DC
  Administered 2018-09-12 (×2): 30 mL via ORAL
  Filled 2018-09-11 (×2): qty 30

## 2018-09-11 MED ORDER — DOCUSATE SODIUM 100 MG PO CAPS
100.0000 mg | ORAL_CAPSULE | Freq: Two times a day (BID) | ORAL | Status: DC
  Administered 2018-09-11 – 2018-09-12 (×2): 100 mg via ORAL
  Filled 2018-09-11 (×3): qty 1

## 2018-09-11 MED ORDER — CEFAZOLIN SODIUM-DEXTROSE 2-4 GM/100ML-% IV SOLN: 2.0000 g | Freq: Four times a day (QID) | INTRAVENOUS | Status: DC

## 2018-09-11 MED ORDER — ASPIRIN 81 MG PO CHEW
81.0000 mg | CHEWABLE_TABLET | Freq: Two times a day (BID) | ORAL | Status: DC
  Administered 2018-09-12 (×2): 81 mg via ORAL
  Filled 2018-09-11 (×3): qty 1

## 2018-09-11 MED ORDER — ONDANSETRON HCL 4 MG/2ML IJ SOLN: 4.0000 mg | Freq: Four times a day (QID) | INTRAMUSCULAR | Status: DC | PRN

## 2018-09-11 MED ORDER — CEFAZOLIN SODIUM 1 G IJ SOLR
INTRAMUSCULAR | Status: AC
  Filled 2018-09-11: qty 20

## 2018-09-11 MED ORDER — ACETAMINOPHEN 325 MG PO TABS
325.0000 mg | ORAL_TABLET | Freq: Four times a day (QID) | ORAL | Status: DC | PRN
  Administered 2018-09-11 – 2018-09-12 (×2): 650 mg via ORAL
  Filled 2018-09-11: qty 2

## 2018-09-11 MED ORDER — CEFAZOLIN SODIUM-DEXTROSE 1-4 GM/50ML-% IV SOLN
1.0000 g | Freq: Once | INTRAVENOUS | Status: AC
  Administered 2018-09-11: 23:00:00 1 g via INTRAVENOUS
  Filled 2018-09-11: qty 50

## 2018-09-11 MED ORDER — TRANEXAMIC ACID 1000 MG/10ML IV SOLN
INTRAVENOUS | Status: DC | PRN
  Administered 2018-09-11: 1000 mg via INTRAVENOUS

## 2018-09-11 MED ORDER — DEXAMETHASONE SODIUM PHOSPHATE 10 MG/ML IJ SOLN
INTRAMUSCULAR | Status: DC | PRN
  Administered 2018-09-11: 5 mg via INTRAVENOUS

## 2018-09-11 MED ORDER — HYDROMORPHONE HCL 1 MG/ML IJ SOLN
INTRAMUSCULAR | Status: AC
  Filled 2018-09-11: qty 0.5

## 2018-09-11 MED ORDER — BUPIVACAINE-EPINEPHRINE 0.5% -1:200000 IJ SOLN
INTRAMUSCULAR | Status: DC | PRN
  Administered 2018-09-11: 30 mL

## 2018-09-11 MED ORDER — SODIUM CHLORIDE 0.9 % IV SOLN
Freq: Once | INTRAVENOUS | Status: AC
  Administered 2018-09-11: 17:00:00 via INTRAVENOUS

## 2018-09-11 SURGICAL SUPPLY — 87 items
ALCOHOL 70% 16 OZ (MISCELLANEOUS) ×2 IMPLANT
BLADE CLIPPER SURG (BLADE) IMPLANT
BLADE SAGITTAL 25.0X1.27X90 (BLADE) ×2 IMPLANT
CHLORAPREP W/TINT 26 (MISCELLANEOUS) ×6 IMPLANT
CLSR STERI-STRIP ANTIMIC 1/2X4 (GAUZE/BANDAGES/DRESSINGS) ×2 IMPLANT
COVER SURGICAL LIGHT HANDLE (MISCELLANEOUS) ×4 IMPLANT
COVER WAND RF STERILE (DRAPES) ×2 IMPLANT
CUP ACET PINNACLE SECTR 48MM (Joint) IMPLANT
DERMABOND ADVANCED (GAUZE/BANDAGES/DRESSINGS) ×1
DERMABOND ADVANCED .7 DNX12 (GAUZE/BANDAGES/DRESSINGS) ×2 IMPLANT
DRAPE C-ARM 42X72 X-RAY (DRAPES) ×2 IMPLANT
DRAPE INCISE IOBAN 66X45 STRL (DRAPES) ×2 IMPLANT
DRAPE ORTHO SPLIT 77X108 STRL (DRAPES) ×2
DRAPE STERI IOBAN 125X83 (DRAPES) ×2 IMPLANT
DRAPE SURG ORHT 6 SPLT 77X108 (DRAPES) ×2 IMPLANT
DRAPE U-SHAPE 47X51 STRL (DRAPES) ×8 IMPLANT
DRSG AQUACEL AG ADV 3.5X10 (GAUZE/BANDAGES/DRESSINGS) ×2 IMPLANT
ELECT BLADE 4.0 EZ CLEAN MEGAD (MISCELLANEOUS) ×2
ELECT CAUTERY BLADE 6.4 (BLADE) ×2 IMPLANT
ELECT PENCIL ROCKER SW 15FT (MISCELLANEOUS) ×2 IMPLANT
ELECT REM PT RETURN 9FT ADLT (ELECTROSURGICAL) ×4
ELECTRODE BLDE 4.0 EZ CLN MEGD (MISCELLANEOUS) ×1 IMPLANT
ELECTRODE REM PT RTRN 9FT ADLT (ELECTROSURGICAL) ×2 IMPLANT
EVACUATOR 1/8 PVC DRAIN (DRAIN) IMPLANT
GLOVE BIO SURGEON STRL SZ8 (GLOVE) ×4 IMPLANT
GLOVE BIO SURGEON STRL SZ8.5 (GLOVE) ×4 IMPLANT
GLOVE BIOGEL PI IND STRL 8 (GLOVE) ×1 IMPLANT
GLOVE BIOGEL PI IND STRL 8.5 (GLOVE) ×1 IMPLANT
GLOVE BIOGEL PI INDICATOR 8 (GLOVE) ×1
GLOVE BIOGEL PI INDICATOR 8.5 (GLOVE) ×1
GLOVE BIOGEL PI ORTHO PRO SZ8 (GLOVE)
GLOVE PI ORTHO PRO STRL SZ8 (GLOVE) IMPLANT
GOWN STRL REUS W/ TWL LRG LVL3 (GOWN DISPOSABLE) ×4 IMPLANT
GOWN STRL REUS W/ TWL XL LVL3 (GOWN DISPOSABLE) ×2 IMPLANT
GOWN STRL REUS W/TWL 2XL LVL3 (GOWN DISPOSABLE) ×2 IMPLANT
GOWN STRL REUS W/TWL LRG LVL3 (GOWN DISPOSABLE) ×4
GOWN STRL REUS W/TWL XL LVL3 (GOWN DISPOSABLE) ×2
HANDPIECE INTERPULSE COAX TIP (DISPOSABLE) ×1
HEAD FEM STD 32X+5 STRL (Hips) ×1 IMPLANT
HOOD PEEL AWAY FACE SHEILD DIS (HOOD) ×4 IMPLANT
KIT BASIN OR (CUSTOM PROCEDURE TRAY) ×4 IMPLANT
KIT TURNOVER KIT B (KITS) ×4 IMPLANT
MANIFOLD NEPTUNE II (INSTRUMENTS) ×4 IMPLANT
MARKER SKIN DUAL TIP RULER LAB (MISCELLANEOUS) ×4 IMPLANT
NDL MAYO TROCAR (NEEDLE) IMPLANT
NDL SPNL 18GX3.5 QUINCKE PK (NEEDLE) ×1 IMPLANT
NEEDLE MAYO TROCAR (NEEDLE) IMPLANT
NEEDLE SPNL 18GX3.5 QUINCKE PK (NEEDLE) ×2 IMPLANT
NS IRRIG 1000ML POUR BTL (IV SOLUTION) ×2 IMPLANT
PACK TOTAL JOINT (CUSTOM PROCEDURE TRAY) ×4 IMPLANT
PACK UNIVERSAL I (CUSTOM PROCEDURE TRAY) ×2 IMPLANT
PAD ARMBOARD 7.5X6 YLW CONV (MISCELLANEOUS) ×8 IMPLANT
PILLOW ABDUCTION MEDIUM (MISCELLANEOUS) ×2 IMPLANT
PINN ALTRX NEUT ID X OD 32X48 ×1 IMPLANT
PINNSECTOR W/GRIP ACE CUP 48MM (Joint) ×2 IMPLANT
RETRIEVER SUT HEWSON (MISCELLANEOUS) ×2 IMPLANT
SAW OSC TIP CART 19.5X105X1.3 (SAW) ×2 IMPLANT
SEALER BIPOLAR AQUA 6.0 (INSTRUMENTS) IMPLANT
SET HNDPC FAN SPRY TIP SCT (DISPOSABLE) ×1 IMPLANT
SOL PREP POV-IOD 4OZ 10% (MISCELLANEOUS) ×2 IMPLANT
STAPLER VISISTAT 35W (STAPLE) IMPLANT
STEM TRI LOC BPS SZ7 W GRIPTON (Hips) IMPLANT
SUT ETHIBOND NAB CT1 #1 30IN (SUTURE) ×4 IMPLANT
SUT FIBERWIRE #2 38 REV NDL BL (SUTURE) ×4
SUT MNCRL AB 3-0 PS2 18 (SUTURE) ×2 IMPLANT
SUT MON AB 2-0 CT1 36 (SUTURE) ×2 IMPLANT
SUT MON AB 3-0 SH 27 (SUTURE)
SUT MON AB 3-0 SH27 (SUTURE) IMPLANT
SUT VIC AB 0 CT1 27 (SUTURE) ×2
SUT VIC AB 0 CT1 27XBRD ANBCTR (SUTURE) ×2 IMPLANT
SUT VIC AB 1 CT1 27 (SUTURE) ×1
SUT VIC AB 1 CT1 27XBRD ANBCTR (SUTURE) ×1 IMPLANT
SUT VIC AB 2-0 CT1 27 (SUTURE) ×3
SUT VIC AB 2-0 CT1 TAPERPNT 27 (SUTURE) ×3 IMPLANT
SUT VIC AB 2-0 SH 27 (SUTURE)
SUT VIC AB 2-0 SH 27XBRD (SUTURE) IMPLANT
SUT VLOC 180 0 24IN GS25 (SUTURE) ×2 IMPLANT
SUTURE FIBERWR#2 38 REV NDL BL (SUTURE) ×2 IMPLANT
SYR 50ML LL SCALE MARK (SYRINGE) ×2 IMPLANT
TOWEL GREEN STERILE (TOWEL DISPOSABLE) ×2 IMPLANT
TOWEL GREEN STERILE FF (TOWEL DISPOSABLE) ×2 IMPLANT
TRAY CATH 16FR W/PLASTIC CATH (SET/KITS/TRAYS/PACK) IMPLANT
TRAY FOLEY W/BAG SLVR 14FR (SET/KITS/TRAYS/PACK) ×2 IMPLANT
TRAY FOLEY W/BAG SLVR 16FR (SET/KITS/TRAYS/PACK)
TRAY FOLEY W/BAG SLVR 16FR ST (SET/KITS/TRAYS/PACK) IMPLANT
TRI LOC BPS SZ 7 W GRIPTON (Hips) ×2 IMPLANT
WATER STERILE IRR 1000ML POUR (IV SOLUTION) ×6 IMPLANT

## 2018-09-11 NOTE — Progress Notes (Signed)
Patient BP runs high with SBP>170s. Patient asymptomatic. Paged on call hospitalist. No further order at this time. Will continue to monitor.

## 2018-09-11 NOTE — Consult Note (Signed)
Reason for Consult:Hip fx Referring Physician: [name] is an 84 y.o. female.  HPI: Kristin Gross had answered the phone yesterday and was moving to sit back down when she tripped over her coffee table and fell. She had immediate left hip pain and could not get up. She was brought to the ED where x-rays showed a left hip fx and orthopedic surgery was consulted. She c/o localize pain in the area. She does not use any assistive devices to ambulate and is moderately active. She lives with her husband.  Past Medical History:  Diagnosis Date  . AAA (abdominal aortic aneurysm) ([place])   . Chronic kidney disease   . Hyperlipidemia   . Hypertension   . PONV (postoperative nausea and vomiting)   . Thyroid disease     Past Surgical History:  Procedure Laterality Date  . ABDOMINAL HYSTERECTOMY    . AORTA - BILATERAL FEMORAL ARTERY BYPASS GRAFT N/A 03/24/2017   Procedure: AORTO BI ILIAC BYPASS GRAFT;  Surgeon: Waynetta Sandy, MD;  Location: [place];  Service: Vascular;  Laterality: N/A;  . AV FISTULA PLACEMENT Left 05/25/2017   Procedure: ARTERIOVENOUS (AV) FISTULA CREATION LEFT ARM;  Surgeon: Waynetta Sandy, MD;  Location: [place];  Service: Vascular;  Laterality: Left;  . BASCILIC VEIN TRANSPOSITION Left 08/02/2017   Procedure: BASILIC VEIN TRANSPOSITION SECOND STAGE;  Surgeon: Waynetta Sandy, MD;  Location: [place];  Service: Vascular;  Laterality: Left;  . CATARACT EXTRACTION, BILATERAL    . EYE SURGERY    . INSERTION OF DIALYSIS CATHETER Right 05/25/2017   Procedure: INSERTION OF TUNNELED  DIALYSIS CATHETER RIGHT INTERNAL JUGULAR;  Surgeon: Waynetta Sandy, MD;  Location: [place];  Service: Vascular;  Laterality: Right;    Family History  Problem Relation Age of Onset  . Heart disease Sister   . Heart attack Sister   . Hypertension Sister   . Cancer Mother     Social History:  reports that she has been smoking cigarettes. She has a 60.00  pack-year smoking history. She has never used smokeless tobacco. She reports that she does not drink alcohol or use drugs.  Allergies:  Allergies  Allergen Reactions  . Lisinopril Swelling    SWELLING REACTION UNSPECIFIED      Medications: I have reviewed the patient's current medications.  Results for orders placed or performed during the hospital encounter of 09/10/18 (from the past 48 hour(s))  CBC with Differential     Status: Abnormal   Collection Time: 09/10/18  8:06 PM  Result Value Ref Range   WBC 12.7 (H) 4.0 - 10.5 K/uL   RBC 3.94 3.87 - 5.11 MIL/uL   Hemoglobin 12.8 12.0 - 15.0 g/dL   HCT 38.2 36.0 - 46.0 %   MCV 97.0 80.0 - 100.0 fL   MCH 32.5 26.0 - 34.0 pg   MCHC 33.5 30.0 - 36.0 g/dL   RDW 13.2 11.5 - 15.5 %   Platelets 167 150 - 400 K/uL   nRBC 0.0 0.0 - 0.2 %   Neutrophils Relative % 74 %   Neutro Abs 9.6 (H) 1.7 - 7.7 K/uL   Lymphocytes Relative 13 %   Lymphs Abs 1.7 0.7 - 4.0 K/uL   Monocytes Relative 7 %   Monocytes Absolute 0.9 0.1 - 1.0 K/uL   Eosinophils Relative 4 %   Eosinophils Absolute 0.5 0.0 - 0.5 K/uL   Basophils Relative 1 %   Basophils Absolute 0.1 0.0 - 0.1 K/uL  Immature Granulocytes 1 %   Abs Immature Granulocytes 0.07 0.00 - 0.07 K/uL    Comment: Performed at [facility], [place] [address], [place], [place] [address]  Comprehensive metabolic panel     Status: Abnormal   Collection Time: 09/10/18  8:06 PM  Result Value Ref Range   Sodium 137 135 - 145 mmol/L   Potassium 3.8 3.5 - 5.1 mmol/L   Chloride 94 (L) 98 - 111 mmol/L   CO2 28 22 - 32 mmol/L   Glucose, Bld 111 (H) 70 - 99 mg/dL   BUN 27 (H) 8 - 23 mg/dL   Creatinine, Ser 7.23 (H) 0.44 - 1.00 mg/dL   Calcium 9.3 8.9 - 10.3 mg/dL   Total Protein 6.7 6.5 - 8.1 g/dL   Albumin 3.1 (L) 3.5 - 5.0 g/dL   AST 16 15 - 41 U/L   ALT 11 0 - 44 U/L   Alkaline Phosphatase 83 38 - 126 U/L   Total Bilirubin 0.4 0.3 - 1.2 mg/dL   GFR calc non Af Amer 5 (L) >60 mL/min   GFR calc Af  Amer 6 (L) >60 mL/min   Anion gap 15 5 - 15    Comment: Performed at [facility], [place] [address], [place], [place] [address]  SARS CORONAVIRUS 2 Nasal Swab Aptima Multi Swab     Status: None   Collection Time: 09/10/18  8:29 PM   Specimen: Aptima Multi Swab; Nasal Swab  Result Value Ref Range   SARS Coronavirus 2 NEGATIVE NEGATIVE    Comment: (NOTE) SARS-CoV-2 target nucleic acids are NOT DETECTED. The SARS-CoV-2 RNA is generally detectable in upper and lower respiratory specimens during the acute phase of infection. Negative results do not preclude SARS-CoV-2 infection, do not rule out co-infections with other pathogens, and should not be used as the sole basis for treatment or other patient management decisions. Negative results must be combined with clinical observations, patient history, and epidemiological information. The expected result is Negative. Fact Sheet for Patients: SugarRoll.be Fact Sheet for Healthcare Providers: https://www.woods-mathews.com/ This test is not yet approved or cleared by the [place] FDA and  has been authorized for detection and/or diagnosis of SARS-CoV-2 by FDA under an Emergency Use Authorization (EUA). This EUA will remain  in effect (meaning this test can be used) for the duration of the COVID-19 declaration under Section 56 4(b)(1) of the Act, 21 U.S.C. section 360bbb-3(b)(1), unless the authorization is terminated or revoked sooner. Performed at [facility], [place] [address], [place], [place] [address]   MRSA PCR Screening     Status: None   Collection Time: 09/11/18 12:34 AM   Specimen: Nasopharyngeal  Result Value Ref Range   MRSA by PCR NEGATIVE NEGATIVE    Comment:        The GeneXpert MRSA Assay (FDA approved for NASAL specimens only), is one component of a comprehensive MRSA colonization surveillance program. It is not intended to diagnose MRSA infection nor to guide  or monitor treatment for MRSA infections. Performed at [facility], [place] [address], [place],  [address]   Comprehensive metabolic panel     Status: Abnormal   Collection Time: 09/11/18  4:08 AM  Result Value Ref Range   Sodium 136 135 - 145 mmol/L   Potassium 4.0 3.5 - 5.1 mmol/L   Chloride 98 98 - 111 mmol/L   CO2 26 22 - 32 mmol/L   Glucose, Bld 103 (H) 70 -  99 mg/dL   BUN 29 (H) 8 - 23 mg/dL   Creatinine, Ser 7.90 (H) 0.44 - 1.00 mg/dL   Calcium 9.1 8.9 - 10.3 mg/dL   Total Protein 5.8 (L) 6.5 - 8.1 g/dL   Albumin 2.9 (L) 3.5 - 5.0 g/dL   AST 21 15 - 41 U/L   ALT 12 0 - 44 U/L   Alkaline Phosphatase 66 38 - 126 U/L   Total Bilirubin 0.7 0.3 - 1.2 mg/dL   GFR calc non Af Amer 4 (L) >60 mL/min   GFR calc Af Amer 5 (L) >60 mL/min   Anion gap 12 5 - 15    Comment: Performed at [facility] [address] [place], [place] [address]  CBC     Status: Abnormal   Collection Time: 09/11/18  4:08 AM  Result Value Ref Range   WBC 11.6 (H) 4.0 - 10.5 K/uL   RBC 3.79 (L) 3.87 - 5.11 MIL/uL   Hemoglobin 12.4 12.0 - 15.0 g/dL   HCT 36.4 36.0 - 46.0 %   MCV 96.0 80.0 - 100.0 fL   MCH 32.7 26.0 - 34.0 pg   MCHC 34.1 30.0 - 36.0 g/dL   RDW 13.2 11.5 - 15.5 %   Platelets 154 150 - 400 K/uL   nRBC 0.0 0.0 - 0.2 %    Comment: Performed at [facility], [place] [address], [place], [place] [address]    Dg Chest 1 View  Result Date: 09/10/2018 CLINICAL DATA:  Fall EXAM: CHEST  1 VIEW COMPARISON:  08/07/2018 FINDINGS: Mild bilateral interstitial opacities, slightly worse on the left, unchanged from the prior study. No focal airspace consolidation. No pneumothorax. Small left pleural effusion. IMPRESSION: Mild interstitial pulmonary edema and small left pleural effusion, unchanged. Electronically Signed   By: [name] M.D.   On: 09/10/2018 21:31   Dg Hip Unilat With Pelvis 2-3 Views Left  Result Date: 09/10/2018 CLINICAL DATA:  84 year old female with left hip  pain after fall. EXAM: DG HIP (WITH OR WITHOUT PELVIS) 2-3V LEFT COMPARISON:  CT Abdomen and Pelvis 03/09/2017. FINDINGS: There is an acute left femoral neck fracture with varus impaction. The intertrochanteric segment of the proximal left femur appears to remain intact. The left femoral head remains normally located. The pelvis appears intact. Grossly intact proximal right femur. Negative visible bowel gas pattern. Aortoiliac calcified atherosclerosis. IMPRESSION: Acute left femoral neck fracture with varus impaction. Electronically Signed   By: Genevie Ann M.D.   On: 09/10/2018 21:24   Dg Femur Min 2 Views Left  Result Date: 09/10/2018 CLINICAL DATA:  84 year old female with left hip fracture after fall. EXAM: LEFT FEMUR 2 VIEWS COMPARISON:  Left hip series today. FINDINGS: Cross-table AP and lateral views of the mid and distal left femur are provided. No additional left femur fracture is identified. Alignment appears preserved at the left knee. No knee joint effusion is evident. The patella appears intact. IMPRESSION: Mid and distal left femur remain intact. Electronically Signed   By: Genevie Ann M.D.   On: 09/10/2018 21:26    Review of Systems  Constitutional: Negative for weight loss.  HENT: Negative for ear discharge, ear pain, hearing loss and tinnitus.   Eyes: Negative for blurred vision, double vision, photophobia and pain.  Respiratory: Negative for cough, sputum production and shortness of breath.   Cardiovascular: Negative for chest pain.  Gastrointestinal: Negative for abdominal pain, nausea and vomiting.  Genitourinary: Negative for dysuria, flank pain, frequency  and urgency.  Musculoskeletal: Positive for joint pain (Left hip). Negative for back pain, falls, myalgias and neck pain.  Neurological: Negative for dizziness, tingling, sensory change, focal weakness, loss of consciousness and headaches.  Endo/Heme/Allergies: Does not bruise/bleed easily.  Psychiatric/Behavioral: Negative for  depression, memory loss and substance abuse. The patient is not nervous/anxious.    Blood pressure (!) 124/57, pulse 74, temperature 99.1 F (37.3 C), temperature source Oral, resp. rate 16, height 5\' 5"  (1.651 m), weight 65 kg, SpO2 95 %. Physical Exam  Constitutional: She appears well-developed and well-nourished. No distress.  HENT:  Head: Normocephalic and atraumatic.  Eyes: Conjunctivae are normal. Right eye exhibits no discharge. Left eye exhibits no discharge. No scleral icterus.  Neck: Normal range of motion.  Cardiovascular: Normal rate and regular rhythm.  Respiratory: Effort normal. No respiratory distress.  Musculoskeletal:     Comments: LLE No traumatic wounds, ecchymosis, or rash  Mild TTP hip  No knee or ankle effusion  Knee stable to varus/ valgus and anterior/posterior stress  Sens DPN, SPN, TN intact  Motor EHL, ext, flex, evers 5/5  DP 2+, PT 1+, No significant edema  Neurological: She is alert.  Skin: Skin is warm and dry. She is not diaphoretic.  Psychiatric: She has a normal mood and affect. Her behavior is normal.    Assessment/Plan: Left hip fx -- For THA today by Dr. Lyla Glassing. Please keep NPO. Multiple medical problems including ESRD on HD, HLD, hypothyroidism, AAA, and HTN -- per primary service    Lisette Abu, PA-C Orthopedic Surgery [phone] 09/11/2018, 11:01 AM

## 2018-09-11 NOTE — Consult Note (Signed)
[facility] Renal Consultation Note    Indication for Consultation:  Management of ESRD/hemodialysis, anemia, hypertension/volume, and secondary hyperparathyroidism. PCP:  HPI: Kristin Gross is a 84 y.o. female with ESRD, HTN, COPD, Hx AAA (s/p B aortoiliac bypass), hyperlipidemia, and hypothyroidism who was admitted s/p L hip fracture.  Was in usual state of health until tripped over her coffee table at home on 8/23 and landed on her L hip. Brought to ED where imaging showed L femoral neck fracture. She was evaluated by orthopedic and planned for surgical repair this afternoon. Intake labs showed K 3.8, Alb 3.1, WBC 12.7, Hgb 12.8.  Seen this morning during dialysis treatment. Says she feels dizzy, which is likely d/t pain medications as laying flat with normal BP and UF off at the moment. Denies CP or dyspnea. No other recent illnesses.  Dialyzes on MWF sched at [facility]. No recent issues with her dialysis, she is compliant with treatments and medications.  Past Medical History:  Diagnosis Date  . AAA (abdominal aortic aneurysm) ([place])   . Chronic kidney disease   . Hyperlipidemia   . Hypertension   . PONV (postoperative nausea and vomiting)   . Thyroid disease    Past Surgical History:  Procedure Laterality Date  . ABDOMINAL HYSTERECTOMY    . AORTA - BILATERAL FEMORAL ARTERY BYPASS GRAFT N/A 03/24/2017   Procedure: AORTO BI ILIAC BYPASS GRAFT;  Surgeon: Waynetta Sandy, MD;  Location: [place];  Service: Vascular;  Laterality: N/A;  . AV FISTULA PLACEMENT Left 05/25/2017   Procedure: ARTERIOVENOUS (AV) FISTULA CREATION LEFT ARM;  Surgeon: Waynetta Sandy, MD;  Location: [place];  Service: Vascular;  Laterality: Left;  . BASCILIC VEIN TRANSPOSITION Left 08/02/2017   Procedure: BASILIC VEIN TRANSPOSITION SECOND STAGE;  Surgeon: Waynetta Sandy, MD;  Location: [place];  Service: Vascular;  Laterality: Left;  . CATARACT EXTRACTION, BILATERAL    .  EYE SURGERY    . INSERTION OF DIALYSIS CATHETER Right 05/25/2017   Procedure: INSERTION OF TUNNELED  DIALYSIS CATHETER RIGHT INTERNAL JUGULAR;  Surgeon: Waynetta Sandy, MD;  Location: [place];  Service: Vascular;  Laterality: Right;   Family History  Problem Relation Age of Onset  . Heart disease Sister   . Heart attack Sister   . Hypertension Sister   . Cancer Mother    Social History:  reports that she has been smoking cigarettes. She has a 60.00 pack-year smoking history. She has never used smokeless tobacco. She reports that she does not drink alcohol or use drugs.  ROS: As per HPI otherwise negative.  Physical Exam: Vitals:   09/11/18 0830 09/11/18 0900 09/11/18 0930 09/11/18 1000  BP: 136/65 139/68 139/68 (!) 124/57  Pulse: 71 75 77 74  Resp:      Temp:      TempSrc:      SpO2:      Weight:      Height:         General: Frail woman, NAD. On room air. Head: Normocephalic, atraumatic, sclera non-icteric, mucus membranes are moist. Neck: Supple without lymphadenopathy/masses. JVD not elevated. Lungs: Clear bilaterally to auscultation without wheezes, rales, or rhonchi. Breathing is unlabored. Heart: RRR with normal S1, S2. No murmurs, rubs, or gallops appreciated. Abdomen: Soft, non-tender, non-distended with normoactive bowel sounds. No rebound/guarding. Musculoskeletal:  Strength and tone appear normal for age. Lower extremities: No edema or ischemic changes, no open wounds. Neuro: Alert and oriented X 3. Moves all extremities spontaneously.  Psych:  Responds to questions appropriately with a normal affect. Dialysis Access: L AVF (cannulated)  Allergies  Allergen Reactions  . Lisinopril Swelling    SWELLING REACTION UNSPECIFIED     Prior to Admission medications   Medication Sig Start Date End Date Taking? Authorizing Provider  amLODipine (NORVASC) 5 MG tablet Take 5 mg by mouth daily. as directed 08/09/18  Yes [provider]  aspirin 81 MG tablet  Take 81 mg by mouth at bedtime.    Yes [provider]  cholecalciferol (VITAMIN D) 1000 units tablet Take 1,000 Units by mouth daily.   Yes [provider]  levothyroxine (SYNTHROID, LEVOTHROID) 100 MCG tablet Take 100 mcg by mouth daily before breakfast.  06/17/17  Yes [provider]  metoprolol tartrate (LOPRESSOR) 50 MG tablet Take 50 mg by mouth daily.    Yes [provider]  rosuvastatin (CRESTOR) 10 MG tablet Take 10 mg by mouth daily.    Yes [provider]   Current Facility-Administered Medications  Medication Dose Route Frequency Provider Last Rate Last Dose  . 0.9 %  sodium chloride infusion  250 mL Intravenous PRN Gala Romney L, MD      . Chlorhexidine Gluconate Cloth 2 % PADS 6 each  6 each Topical Q0600 Elwyn Reach, MD   6 each at 09/11/18 0636  . heparin injection 5,000 Units  5,000 Units Subcutaneous Q8H Garba, Mohammad L, MD      . HYDROmorphone (DILAUDID) injection 0.5 mg  0.5 mg Intravenous Q4H PRN Elwyn Reach, MD   0.5 mg at 09/11/18 0805  . morphine 2 MG/ML injection 2 mg  2 mg Intravenous Q2H PRN Elwyn Reach, MD   2 mg at 09/10/18 2337  . sodium chloride flush (NS) 0.9 % injection 3 mL  3 mL Intravenous Q12H Elwyn Reach, MD   3 mL at 09/10/18 2348  . sodium chloride flush (NS) 0.9 % injection 3 mL  3 mL Intravenous PRN Elwyn Reach, MD       Labs: Basic Metabolic Panel: Recent Labs  Lab 09/10/18 2006 09/11/18 0408  NA 137 136  K 3.8 4.0  CL 94* 98  CO2 28 26  GLUCOSE 111* 103*  BUN 27* 29*  CREATININE 7.23* 7.90*  CALCIUM 9.3 9.1   Liver Function Tests: Recent Labs  Lab 09/10/18 2006 09/11/18 0408  AST 16 21  ALT 11 12  ALKPHOS 83 66  BILITOT 0.4 0.7  PROT 6.7 5.8*  ALBUMIN 3.1* 2.9*   CBC: Recent Labs  Lab 09/10/18 2006 09/11/18 0408  WBC 12.7* 11.6*  NEUTROABS 9.6*  --   HGB 12.8 12.4  HCT 38.2 36.4  MCV 97.0 96.0  PLT 167 154   Studies/Results: Dg Chest 1  View  Result Date: 09/10/2018 CLINICAL DATA:  Fall EXAM: CHEST  1 VIEW COMPARISON:  08/07/2018 FINDINGS: Mild bilateral interstitial opacities, slightly worse on the left, unchanged from the prior study. No focal airspace consolidation. No pneumothorax. Small left pleural effusion. IMPRESSION: Mild interstitial pulmonary edema and small left pleural effusion, unchanged. Electronically Signed   By: [name] M.D.   On: 09/10/2018 21:31   Dg Hip Unilat With Pelvis 2-3 Views Left  Result Date: 09/10/2018 CLINICAL DATA:  84 year old female with left hip pain after fall. EXAM: DG HIP (WITH OR WITHOUT PELVIS) 2-3V LEFT COMPARISON:  CT Abdomen and Pelvis 03/09/2017. FINDINGS: There is an acute left femoral neck fracture with varus impaction. The intertrochanteric segment  of the proximal left femur appears to remain intact. The left femoral head remains normally located. The pelvis appears intact. Grossly intact proximal right femur. Negative visible bowel gas pattern. Aortoiliac calcified atherosclerosis. IMPRESSION: Acute left femoral neck fracture with varus impaction. Electronically Signed   By: Genevie Ann M.D.   On: 09/10/2018 21:24   Dg Femur Min 2 Views Left  Result Date: 09/10/2018 CLINICAL DATA:  84 year old female with left hip fracture after fall. EXAM: LEFT FEMUR 2 VIEWS COMPARISON:  Left hip series today. FINDINGS: Cross-table AP and lateral views of the mid and distal left femur are provided. No additional left femur fracture is identified. Alignment appears preserved at the left knee. No knee joint effusion is evident. The patella appears intact. IMPRESSION: Mid and distal left femur remain intact. Electronically Signed   By: Genevie Ann M.D.   On: 09/10/2018 21:26   Dialysis Orders:  MWF at [facility] 4hr, 400/800, EDW 55kg, 2K/2Ca, UFP #2, AVF, heparin 2000 - Venofer 50mg  weekly - Calcitriol 0.79mcg PO q HD - No ESA  Assessment/Plan: 1.  L femoral neck fracture (s/p fall at home): For surgical  treatment later today. 2.  ESRD: Continue HD per MWF schedule - HD now. No heparin 3.  Hypertension/volume: BP stable, no edema. Minimal UF today. 4.  Anemia: Hgb > 12 currently, no ESA as outpatient. Anticipating post-op Hgb drop, likely will give ESA later this week. 5.  Metabolic bone disease: Ca ok, continue home meds/VDRA. 6.  Nutrition: Alb low, will add supplements. 7.  Hypothyroidism  Veneta Penton, PA-C 09/11/2018, 10:22 AM  [facility] Pager: [phone]

## 2018-09-11 NOTE — Procedures (Signed)
I was present at this dialysis session. I have reviewed the session itself and made appropriate changes.   HD today on schedule prior to repair of L Femoral fracture later today.  K 4.0, using 4K bath. No ehparin. No issues usign LUE AVF.    UF limited by mild low BPs.  Hb 12.4.    Filed Weights   09/10/18 2008 09/10/18 2323 09/11/18 0650  Weight: 54.4 kg 54.4 kg 65 kg    Recent Labs  Lab 09/11/18 0408  NA 136  K 4.0  CL 98  CO2 26  GLUCOSE 103*  BUN 29*  CREATININE 7.90*  CALCIUM 9.1    Recent Labs  Lab 09/10/18 2006 09/11/18 0408  WBC 12.7* 11.6*  NEUTROABS 9.6*  --   HGB 12.8 12.4  HCT 38.2 36.4  MCV 97.0 96.0  PLT 167 154    Scheduled Meds: . Chlorhexidine Gluconate Cloth  6 each Topical Q0600  . heparin  5,000 Units Subcutaneous Q8H  . HYDROmorphone      . sodium chloride flush  3 mL Intravenous Q12H   Continuous Infusions: . sodium chloride     PRN Meds:.sodium chloride, HYDROmorphone (DILAUDID) injection, morphine injection, sodium chloride flush   Pearson Grippe  MD 09/11/2018, 8:33 AM

## 2018-09-11 NOTE — Op Note (Signed)
OPERATIVE REPORT  SURGEON: Rod Can, MD   ASSISTANT: Ky Barban, RNFA  PREOPERATIVE DIAGNOSIS: Displaced Left femoral neck fracture.   POSTOPERATIVE DIAGNOSIS: Displaced Left femoral neck fracture.   PROCEDURE: Left total hip arthroplasty, anterior approach.   IMPLANTS: DePuy Tri Lock stem, size 7, hi offset. DePuy Pinnacle Cup, size 48 mm. DePuy Altrx liner, size 32 by 52 mm, +4 neutral. DePuy metal head ball, size 32 + 5 mm.  ANESTHESIA:  General  ANTIBIOTICS: 2g ancef.  ESTIMATED BLOOD LOSS:-400 mL    DRAINS: None.  COMPLICATIONS: None   CONDITION: PACU - hemodynamically stable.   BRIEF CLINICAL NOTE: Kristin Gross is a 84 y.o. female with a displaced Left femoral neck fracture. The patient was admitted to the hospitalist service and underwent perioperative risk stratification and medical optimization. The risks, benefits, and alternatives to total hip arthroplasty were explained, and the patient elected to proceed.  PROCEDURE IN DETAIL: The patient was taken to the operating room and general anesthesia was induced on the hospital bed.  The patient was then positioned on the Hana table.  All bony prominences were well padded.  The hip was prepped and draped in the normal sterile surgical fashion.  A time-out was called verifying side and site of surgery. Antibiotics were given within 60 minutes of beginning the procedure.  The direct anterior approach to the hip was performed through the Hueter interval.  Lateral femoral circumflex vessels were treated with the Auqumantys. The anterior capsule was exposed and an inverted T capsulotomy was made.  Fracture hematoma was encountered and evacuated. The patient was found to have a comminuted Left subcapital femoral neck fracture.  I freshened the femoral neck cut with a saw.  I removed the femoral neck fragment.  A corkscrew was placed into the head and the head was removed.  This was passed to the back table and was  measured.   Acetabular exposure was achieved, and the pulvinar and labrum were excised. Sequential reaming of the acetabulum was then performed up to a size 47 mm reamer. A 48 mm cup was then opened and impacted into place at approximately 40 degrees of abduction and 20 degrees of anteversion. The final polyethylene liner was impacted into place.    I then gained femoral exposure taking care to protect the abductors and greater trochanter.  This was performed using standard external rotation, extension, and adduction.  The capsule was peeled off the inner aspect of the greater trochanter, taking care to preserve the short external rotators. A cookie cutter was used to enter the femoral canal, and then the femoral canal finder was used to confirm location.  I then sequentially broached up to a size 7.  Calcar planer was used on the femoral neck remnant.  I paced a std neck and a trial head ball. The hip was reduced.  Leg lengths were checked fluoroscopically.  Note that leg lengths and offset were equal, but soft tissue tension was not acceptable. I trialed with a high offset stem and subsequently up to a +5 head ball to achieve adequate tension and stability. The hip was dislocated and trial components were removed.  I placed the real stem followed by the real spacer and head ball.  The hip was reduced.  Fluoroscopy was used to confirm component position and leg lengths.  At 90 degrees of external rotation and extension, the hip was stable to an anterior directed force.   The wound was copiously irrigated with Irrisept solution and  normal saline using pule lavage.  Marcaine solution was injected into the periarticular soft tissue.  The wound was closed in layers using #1 Vicryl and V-Loc for the fascia, 2-0 Vicryl for the subcutaneous fat, 2-0 Monocryl for the deep dermal layer, 3-0 running Monocryl subcuticular stitch and glue for the skin.  Once the glue was fully dried, an Aquacell Ag dressing was applied.   The patient was then awakened from anesthesia and transported to the recovery room in stable condition.  Sponge, needle, and instrument counts were correct at the end of the case x2.  The patient tolerated the procedure well and there were no known complications.

## 2018-09-11 NOTE — Progress Notes (Signed)
CSW acknowledges consult for SNF. The patient had surgery today. The patient has pending PT/OT evaluations. CSW will continue to follow and base disposition plan off of therapy recommendations.   Donni Oglesby, MSW, LCSW-A Clinical Social Worker [facility]  [phone]  

## 2018-09-11 NOTE — Progress Notes (Signed)
[facility] PROGRESS NOTE  Kristin Gross ZHG:[id number] DOB: Mar 07, 1934 DOA: 09/10/2018  PCP: [name], MD  Brief History/Interval Summary: 84 y.o. female with medical history significant of end-stage renal disease on hemodialysis Mondays Wednesdays and Fridays, hypertension, hyperlipidemia, hypothyroidism, abdominal aortic aneurysm who sustained a mechanical fall at home.  Patient was sitting in the chair and got up to answer the phone and tripped and fell over her coffee table landing on the left hip.  She denied hitting her head or losing consciousness.  She was found to have left hip fracture.  She was hospitalized for further management.  Reason for Visit: Left hip fracture  Consultants: Orthopedics  Procedures: Dialysis as per her usual schedule  Antibiotics: Anti-infectives (From admission, onward)   None       Subjective/Interval History: Patient seen while she was getting dialyzed.  She does have significant pain in the left hip area.  Denies any shortness of breath or chest pain.  Denies any history of heart disease.  ROS: No nausea or vomiting  Objective:  Vital Signs  Vitals:   09/11/18 0830 09/11/18 0900 09/11/18 0930 09/11/18 1000  BP: 136/65 139/68 139/68 (!) 124/57  Pulse: 71 75 77 74  Resp:      Temp:      TempSrc:      SpO2:      Weight:      Height:       No intake or output data in the 24 hours ending 09/11/18 1108 Filed Weights   09/10/18 2008 09/10/18 2323 09/11/18 0650  Weight: 54.4 kg 54.4 kg 65 kg    General appearance: Awake alert.  In no distress Resp: Clear to auscultation bilaterally.  Normal effort Cardio: S1-S2 is normal regular.  No S3-S4.  No rubs murmurs or bruit GI: Abdomen is soft.  Nontender nondistended.  Bowel sounds are present normal.  No masses organomegaly Extremities: No edema.  Left lower extremity is externally rotated Neurologic: She is awake alert.  No focal neurological deficits noted.   Lab  Results:  Data Reviewed: I have personally reviewed following labs and imaging studies  CBC: Recent Labs  Lab 09/10/18 2006 09/11/18 0408  WBC 12.7* 11.6*  NEUTROABS 9.6*  --   HGB 12.8 12.4  HCT 38.2 36.4  MCV 97.0 96.0  PLT 167 196    Basic Metabolic Panel: Recent Labs  Lab 09/10/18 2006 09/11/18 0408  NA 137 136  K 3.8 4.0  CL 94* 98  CO2 28 26  GLUCOSE 111* 103*  BUN 27* 29*  CREATININE 7.23* 7.90*  CALCIUM 9.3 9.1    GFR: Estimated Creatinine Clearance: 4.9 mL/min (A) (by C-G formula based on SCr of 7.9 mg/dL (H)).  Liver Function Tests: Recent Labs  Lab 09/10/18 2006 09/11/18 0408  AST 16 21  ALT 11 12  ALKPHOS 83 66  BILITOT 0.4 0.7  PROT 6.7 5.8*  ALBUMIN 3.1* 2.9*      Recent Results (from the past 240 hour(s))  SARS CORONAVIRUS 2 Nasal Swab Aptima Multi Swab     Status: None   Collection Time: 09/10/18  8:29 PM   Specimen: Aptima Multi Swab; Nasal Swab  Result Value Ref Range Status   SARS Coronavirus 2 NEGATIVE NEGATIVE Final    Comment: (NOTE) SARS-CoV-2 target nucleic acids are NOT DETECTED. The SARS-CoV-2 RNA is generally detectable in upper and lower respiratory specimens during the acute phase of infection. Negative results do not preclude SARS-CoV-2 infection, do  not rule out co-infections with other pathogens, and should not be used as the sole basis for treatment or other patient management decisions. Negative results must be combined with clinical observations, patient history, and epidemiological information. The expected result is Negative. Fact Sheet for Patients: SugarRoll.be Fact Sheet for Healthcare Providers: https://www.woods-mathews.com/ This test is not yet approved or cleared by the [place] FDA and  has been authorized for detection and/or diagnosis of SARS-CoV-2 by FDA under an Emergency Use Authorization (EUA). This EUA will remain  in effect (meaning this test can  be used) for the duration of the COVID-19 declaration under Section 56 4(b)(1) of the Act, 21 U.S.C. section 360bbb-3(b)(1), unless the authorization is terminated or revoked sooner. Performed at [facility], [place] [address], [place], [place] [address]   MRSA PCR Screening     Status: None   Collection Time: 09/11/18 12:34 AM   Specimen: Nasopharyngeal  Result Value Ref Range Status   MRSA by PCR NEGATIVE NEGATIVE Final    Comment:        The GeneXpert MRSA Assay (FDA approved for NASAL specimens only), is one component of a comprehensive MRSA colonization surveillance program. It is not intended to diagnose MRSA infection nor to guide or monitor treatment for MRSA infections. Performed at [facility], [place] [address] [place], [place] [address]       Radiology Studies: Dg Chest 1 View  Result Date: 09/10/2018 CLINICAL DATA:  Fall EXAM: CHEST  1 VIEW COMPARISON:  08/07/2018 FINDINGS: Mild bilateral interstitial opacities, slightly worse on the left, unchanged from the prior study. No focal airspace consolidation. No pneumothorax. Small left pleural effusion. IMPRESSION: Mild interstitial pulmonary edema and small left pleural effusion, unchanged. Electronically Signed   By: [name] M.D.   On: 09/10/2018 21:31   Dg Hip Unilat With Pelvis 2-3 Views Left  Result Date: 09/10/2018 CLINICAL DATA:  84 year old female with left hip pain after fall. EXAM: DG HIP (WITH OR WITHOUT PELVIS) 2-3V LEFT COMPARISON:  CT Abdomen and Pelvis 03/09/2017. FINDINGS: There is an acute left femoral neck fracture with varus impaction. The intertrochanteric segment of the proximal left femur appears to remain intact. The left femoral head remains normally located. The pelvis appears intact. Grossly intact proximal right femur. Negative visible bowel gas pattern. Aortoiliac calcified atherosclerosis. IMPRESSION: Acute left femoral neck fracture with varus impaction. Electronically Signed    By: Genevie Ann M.D.   On: 09/10/2018 21:24   Dg Femur Min 2 Views Left  Result Date: 09/10/2018 CLINICAL DATA:  84 year old female with left hip fracture after fall. EXAM: LEFT FEMUR 2 VIEWS COMPARISON:  Left hip series today. FINDINGS: Cross-table AP and lateral views of the mid and distal left femur are provided. No additional left femur fracture is identified. Alignment appears preserved at the left knee. No knee joint effusion is evident. The patella appears intact. IMPRESSION: Mid and distal left femur remain intact. Electronically Signed   By: Genevie Ann M.D.   On: 09/10/2018 21:26     Medications:  Scheduled: . [MAR Hold] Chlorhexidine Gluconate Cloth  6 each Topical Q0600  . [MAR Hold] feeding supplement (PRO-STAT SUGAR FREE 64)  30 mL Oral BID  . [MAR Hold] heparin  5,000 Units Subcutaneous Q8H  . [MAR Hold] sodium chloride flush  3 mL Intravenous Q12H   Continuous: . [MAR Hold] sodium chloride     PRN:[MAR Hold] sodium chloride, [MAR Hold]  HYDROmorphone (DILAUDID) injection, [MAR Hold]  morphine injection, [MAR Hold] sodium chloride flush    Assessment/Plan:    Left hip fracture as a result of mechanical fall Patient without any history of heart disease.  Cardiac examination was benign.  Patient had unremarkable stress test on March 19, 2017.  Patient denies any chest pain or shortness of breath.  She is being dialyzed this morning.  Should be able to undergo surgery without further work-up.    End-stage renal disease on hemodialysis on Monday Wednesday and Fridays Nephrology is following.  Patient being dialyzed this morning.  Essential hypertension Was hypertensive when she came in.  Possibly due to pain.  Better controlled this morning.  Hypothyroidism Continue levothyroxine.  Leukocytosis Likely reactive.  She is afebrile.  No obvious source of infection.   DVT Prophylaxis: Subcutaneous heparin    Code Status: Full code Family Communication: Discussed with the  patient Disposition Plan: Management as outlined above.  Await surgery.  Will likely need to go to skilled nursing facility eventually.    LOS: 1 day   Katharine Rochefort [employer] on [email or web address]  09/11/2018, 11:08 AM

## 2018-09-11 NOTE — Anesthesia Procedure Notes (Signed)
Procedure Name: Intubation Date/Time: 09/11/2018 12:13 PM Performed by: Raenette Rover, CRNA Pre-anesthesia Checklist: Patient identified, Emergency Drugs available, Suction available and Patient being monitored Patient Re-evaluated:Patient Re-evaluated prior to induction Oxygen Delivery Method: Circle system utilized Preoxygenation: Pre-oxygenation with 100% oxygen Induction Type: IV induction Ventilation: Mask ventilation without difficulty Laryngoscope Size: Miller and 2 Grade View: Grade I Tube type: Oral Tube size: 7.0 mm Number of attempts: 1 Airway Equipment and Method: Stylet Placement Confirmation: ETT inserted through vocal cords under direct vision,  positive ETCO2,  CO2 detector and breath sounds checked- equal and bilateral Secured at: 20 cm Tube secured with: Tape Dental Injury: Teeth and Oropharynx as per pre-operative assessment

## 2018-09-11 NOTE — Anesthesia Postprocedure Evaluation (Signed)
Anesthesia Post Note  Patient: TRANISE FORREST  Procedure(s) Performed: Anterior Approach Hemi Hip Arthroplasty (Left Hip)     Patient location during evaluation: PACU Anesthesia Type: General Level of consciousness: awake and alert Pain management: pain level controlled Vital Signs Assessment: post-procedure vital signs reviewed and stable Respiratory status: spontaneous breathing, nonlabored ventilation, respiratory function stable and patient connected to nasal cannula oxygen Cardiovascular status: blood pressure returned to baseline and stable Postop Assessment: no apparent nausea or vomiting Anesthetic complications: no    Last Vitals:  Vitals:   09/11/18 1515 09/11/18 1603  BP: (!) 118/54 (!) 101/51  Pulse: (!) 57 (!) 52  Resp: 16 16  Temp: (!) 36.3 C 36.6 C  SpO2: 100% 98%    Last Pain:  Vitals:   09/11/18 1603  TempSrc: Oral  PainSc:                  Audry Pili

## 2018-09-11 NOTE — Interval H&P Note (Signed)
History and Physical Interval Note:  09/11/2018 11:57 AM  Kristin Gross  has presented today for surgery, with the diagnosis of LEFT HIP.  The various methods of treatment have been discussed with the patient and family. After consideration of risks, benefits and other options for treatment, the patient has consented to  Procedure(s): Anterior Approach Hemi Hip Arthroplasty (Left) as a surgical intervention.  The patient's history has been reviewed, patient examined, no change in status, stable for surgery.  I have reviewed the patient's chart and labs.  Questions were answered to the patient's satisfaction.    The risks, benefits, and alternatives were discussed with the patient. There are risks associated with the surgery including, but not limited to, problems with anesthesia (death), infection, instability (giving out of the joint), dislocation, differences in leg length/angulation/rotation, fracture of bones, loosening or failure of implants, hematoma (blood accumulation) which may require surgical drainage, blood clots, pulmonary embolism, nerve injury (foot drop and lateral thigh numbness), and blood vessel injury. The patient understands these risks and elects to proceed.   Hilton Cork Mayerly Kaman

## 2018-09-11 NOTE — Discharge Instructions (Signed)
°Dr. Keawe Marcello °Joint Replacement Specialist °[facility] °[address] °[place], [place] [address] °[phone] ° ° °TOTAL HIP REPLACEMENT POSTOPERATIVE DIRECTIONS ° ° ° °Hip Rehabilitation, Guidelines Following Surgery  ° °WEIGHT BEARING °Weight bearing as tolerated with assist device (walker, cane, etc) as directed, use it as long as suggested by your surgeon or therapist, typically at least 4-6 weeks. ° °The results of a hip operation are greatly improved after range of motion and muscle strengthening exercises. Follow all safety measures which are given to protect your hip. If any of these exercises cause increased pain or swelling in your joint, decrease the amount until you are comfortable again. Then slowly increase the exercises. Call your caregiver if you have problems or questions.  ° °HOME CARE INSTRUCTIONS  °Most of the following instructions are designed to prevent the dislocation of your new hip.  °Remove items at home which could result in a fall. This includes throw rugs or furniture in walking pathways.  °Continue medications as instructed at time of discharge. °· You may have some home medications which will be placed on hold until you complete the course of blood thinner medication. °· You may start showering once you are discharged home. Do not remove your dressing. °Do not put on socks or shoes without following the instructions of your caregivers.   °Sit on chairs with arms. Use the chair arms to help push yourself up when arising.  °Arrange for the use of a toilet seat elevator so you are not sitting low.  °· Walk with walker as instructed.  °You may resume a sexual relationship in one month or when given the OK by your caregiver.  °Use walker as long as suggested by your caregivers.  °You may put full weight on your legs and walk as much as is comfortable. °Avoid periods of inactivity such as sitting longer than an hour when not asleep. This helps prevent  blood clots.  °You may return to work once you are cleared by your surgeon.  °Do not drive a car for 6 weeks or until released by your surgeon.  °Do not drive while taking narcotics.  °Wear elastic stockings for two weeks following surgery during the day but you may remove then at night.  °Make sure you keep all of your appointments after your operation with all of your doctors and caregivers. You should call the office at the above phone number and make an appointment for approximately two weeks after the date of your surgery. °Please pick up a stool softener and laxative for home use as long as you are requiring pain medications. °· ICE to the affected hip every three hours for 30 minutes at a time and then as needed for pain and swelling. Continue to use ice on the hip for pain and swelling from surgery. You may notice swelling that will progress down to the foot and ankle.  This is normal after surgery.  Elevate the leg when you are not up walking on it.   °It is important for you to complete the blood thinner medication as prescribed by your doctor. °· Continue to use the breathing machine which will help keep your temperature down.  It is common for your temperature to cycle up and down following surgery, especially at night when you are not up moving around and exerting yourself.  The breathing machine keeps your lungs expanded and your temperature down. ° °RANGE OF MOTION AND STRENGTHENING EXERCISES  °These exercises are   designed to help you keep full movement of your hip joint. Follow your caregiver's or physical therapist's instructions. Perform all exercises about fifteen times, three times per day or as directed. Exercise both hips, even if you have had only one joint replacement. These exercises can be done on a training (exercise) mat, on the floor, on a table or on a bed. Use whatever works the best and is most comfortable for you. Use music or television while you are exercising so that the exercises  are a pleasant break in your day. This will make your life better with the exercises acting as a break in routine you can look forward to.  °Lying on your back, slowly slide your foot toward your buttocks, raising your knee up off the floor. Then slowly slide your foot back down until your leg is straight again.  °Lying on your back spread your legs as far apart as you can without causing discomfort.  °Lying on your side, raise your upper leg and foot straight up from the floor as far as is comfortable. Slowly lower the leg and repeat.  °Lying on your back, tighten up the muscle in the front of your thigh (quadriceps muscles). You can do this by keeping your leg straight and trying to raise your heel off the floor. This helps strengthen the largest muscle supporting your knee.  °Lying on your back, tighten up the muscles of your buttocks both with the legs straight and with the knee bent at a comfortable angle while keeping your heel on the floor.  ° °SKILLED REHAB INSTRUCTIONS: °If the patient is transferred to a skilled rehab facility following release from the hospital, a list of the current medications will be sent to the facility for the patient to continue.  When discharged from the skilled rehab facility, please have the facility set up the patient's Home Health Physical Therapy prior to being released. Also, the skilled facility will be responsible for providing the patient with their medications at time of release from the facility to include their pain medication and their blood thinner medication. If the patient is still at the rehab facility at time of the two week follow up appointment, the skilled rehab facility will also need to assist the patient in arranging follow up appointment in our office and any transportation needs. ° °MAKE SURE YOU:  °Understand these instructions.  °Will watch your condition.  °Will get help right away if you are not doing well or get worse. ° °Pick up stool softner and  laxative for home use following surgery while on pain medications. °Do not remove your dressing. °The dressing is waterproof--it is OK to take showers. °Continue to use ice for pain and swelling after surgery. °Do not use any lotions or creams on the incision until instructed by your surgeon. °Total Hip Protocol. ° ° °

## 2018-09-11 NOTE — Anesthesia Preprocedure Evaluation (Addendum)
Anesthesia Evaluation  Patient identified by MRN, date of birth, ID bandGeneral Assessment Comment:Patient awake  Reviewed: Allergy & Precautions, NPO status , Patient's Chart, lab work & pertinent test results  Airway Mallampati: III  TM Distance: >3 FB Neck ROM: Full    Dental  (+) Edentulous Upper, Edentulous Lower   Pulmonary Current Smoker and Patient abstained from smoking.,    Pulmonary exam normal breath sounds clear to auscultation       Cardiovascular hypertension, Pt. on medications and Pt. on home beta blockers Normal cardiovascular exam Rhythm:Regular Rate:Normal  ECG: rate 64. Normal sinus rhythm Right bundle branch block   Neuro/Psych negative neurological ROS  negative psych ROS   GI/Hepatic negative GI ROS, Neg liver ROS,   Endo/Other  Hypothyroidism   Renal/GU ESRF and DialysisRenal diseaseOn HD M, W, F     Musculoskeletal negative musculoskeletal ROS (+)   Abdominal   Peds  Hematology HLD   Anesthesia Other Findings Fall with left hip injury  Reproductive/Obstetrics                           Anesthesia Physical Anesthesia Plan  ASA: IV  Anesthesia Plan: General   Post-op Pain Management:    Induction: Intravenous  PONV Risk Score and Plan: 2 and Ondansetron, Dexamethasone and Treatment may vary due to age or medical condition  Airway Management Planned: Oral ETT  Additional Equipment:   Intra-op Plan:   Post-operative Plan: Extubation in OR  Informed Consent: I have reviewed the patients History and Physical, chart, labs and discussed the procedure including the risks, benefits and alternatives for the proposed anesthesia with the patient or authorized representative who has indicated his/her understanding and acceptance.     Dental advisory given  Plan Discussed with: CRNA  Anesthesia Plan Comments: (Patient prefers general anesthesia)         Anesthesia Quick Evaluation

## 2018-09-11 NOTE — Progress Notes (Signed)
PHARMACY NOTE:  ANTIMICROBIAL RENAL DOSAGE ADJUSTMENT  Current antimicrobial regimen includes a mismatch between antimicrobial dosage and estimated renal function.  As per policy approved by the Pharmacy & Therapeutics and Medical Executive Committees, the antimicrobial dosage will be adjusted accordingly.  Current antimicrobial dosage:  Cefazolin 2 gm IV Q 6 hr X 2  Indication: Surgical prophylaxis  Renal Function:  Estimated Creatinine Clearance: 4.9 mL/min (A) (by C-G formula based on SCr of 7.9 mg/dL (H)). [x]      On intermittent HD, scheduled: []      On CRRT    Antimicrobial dosage has been changed to:  Cefazolin 1 gm IV X 1  Thank you for allowing pharmacy to be a part of this patient's care.  Gillermina Hu, PharmD, BCPS, [facility] [place] Clinical Pharmacist 09/11/2018 4:11 PM

## 2018-09-11 NOTE — Transfer of Care (Signed)
Immediate Anesthesia Transfer of Care Note  Patient: Kristin Gross  Procedure(s) Performed: Anterior Approach Hemi Hip Arthroplasty (Left Hip)  Patient Location: PACU  Anesthesia Type:General  Level of Consciousness: awake, alert , oriented and patient cooperative  Airway & Oxygen Therapy: Patient Spontanous Breathing and Patient connected to face mask oxygen  Post-op Assessment: Report given to RN and Post -op Vital signs reviewed and stable  Post vital signs: Reviewed and stable  Last Vitals:  Vitals Value Taken Time  BP 151/54 09/11/18 1417  Temp 36.1 C 09/11/18 1415  Pulse 68 09/11/18 1419  Resp 13 09/11/18 1419  SpO2 94 % 09/11/18 1419  Vitals shown include unvalidated device data.  Last Pain:  Vitals:   09/11/18 1415  TempSrc:   PainSc: Asleep      Patients Stated Pain Goal: 0 (93/96/88 6484)  Complications: No apparent anesthesia complications

## 2018-09-11 NOTE — H&P (View-Only) (Signed)
Reason for Consult:Hip fx Referring Physician: Malisha Mabey is an 84 y.o. female.  HPI: Kristin Gross had answered the phone yesterday and was moving to sit back down when she tripped over her coffee table and fell. She had immediate left hip pain and could not get up. She was brought to the ED where x-rays showed a left hip fx and orthopedic surgery was consulted. She c/o localize pain in the area. She does not use any assistive devices to ambulate and is moderately active. She lives with her husband.  Past Medical History:  Diagnosis Date  . AAA (abdominal aortic aneurysm) ([place])   . Chronic kidney disease   . Hyperlipidemia   . Hypertension   . PONV (postoperative nausea and vomiting)   . Thyroid disease     Past Surgical History:  Procedure Laterality Date  . ABDOMINAL HYSTERECTOMY    . AORTA - BILATERAL FEMORAL ARTERY BYPASS GRAFT N/A 03/24/2017   Procedure: AORTO BI ILIAC BYPASS GRAFT;  Surgeon: Waynetta Sandy, MD;  Location: [facility];  Service: Vascular;  Laterality: N/A;  . AV FISTULA PLACEMENT Left 05/25/2017   Procedure: ARTERIOVENOUS (AV) FISTULA CREATION LEFT ARM;  Surgeon: Waynetta Sandy, MD;  Location: [facility];  Service: Vascular;  Laterality: Left;  . BASCILIC VEIN TRANSPOSITION Left 08/02/2017   Procedure: BASILIC VEIN TRANSPOSITION SECOND STAGE;  Surgeon: Waynetta Sandy, MD;  Location: [facility];  Service: Vascular;  Laterality: Left;  . CATARACT EXTRACTION, BILATERAL    . EYE SURGERY    . INSERTION OF DIALYSIS CATHETER Right 05/25/2017   Procedure: INSERTION OF TUNNELED  DIALYSIS CATHETER RIGHT INTERNAL JUGULAR;  Surgeon: Waynetta Sandy, MD;  Location: [facility];  Service: Vascular;  Laterality: Right;    Family History  Problem Relation Age of Onset  . Heart disease Sister   . Heart attack Sister   . Hypertension Sister   . Cancer Mother     Social History:  reports that she has been smoking cigarettes. She has a 60.00  pack-year smoking history. She has never used smokeless tobacco. She reports that she does not drink alcohol or use drugs.  Allergies:  Allergies  Allergen Reactions  . Lisinopril Swelling    SWELLING REACTION UNSPECIFIED      Medications: I have reviewed the patient's current medications.  Results for orders placed or performed during the hospital encounter of 09/10/18 (from the past 48 hour(s))  CBC with Differential     Status: Abnormal   Collection Time: 09/10/18  8:06 PM  Result Value Ref Range   WBC 12.7 (H) 4.0 - 10.5 K/uL   RBC 3.94 3.87 - 5.11 MIL/uL   Hemoglobin 12.8 12.0 - 15.0 g/dL   HCT 38.2 36.0 - 46.0 %   MCV 97.0 80.0 - 100.0 fL   MCH 32.5 26.0 - 34.0 pg   MCHC 33.5 30.0 - 36.0 g/dL   RDW 13.2 11.5 - 15.5 %   Platelets 167 150 - 400 K/uL   nRBC 0.0 0.0 - 0.2 %   Neutrophils Relative % 74 %   Neutro Abs 9.6 (H) 1.7 - 7.7 K/uL   Lymphocytes Relative 13 %   Lymphs Abs 1.7 0.7 - 4.0 K/uL   Monocytes Relative 7 %   Monocytes Absolute 0.9 0.1 - 1.0 K/uL   Eosinophils Relative 4 %   Eosinophils Absolute 0.5 0.0 - 0.5 K/uL   Basophils Relative 1 %   Basophils Absolute 0.1 0.0 - 0.1 K/uL  Immature Granulocytes 1 %   Abs Immature Granulocytes 0.07 0.00 - 0.07 K/uL    Comment: Performed at [facility], [place] [address] [place], [place] [address]  Comprehensive metabolic panel     Status: Abnormal   Collection Time: 09/10/18  8:06 PM  Result Value Ref Range   Sodium 137 135 - 145 mmol/L   Potassium 3.8 3.5 - 5.1 mmol/L   Chloride 94 (L) 98 - 111 mmol/L   CO2 28 22 - 32 mmol/L   Glucose, Bld 111 (H) 70 - 99 mg/dL   BUN 27 (H) 8 - 23 mg/dL   Creatinine, Ser 7.23 (H) 0.44 - 1.00 mg/dL   Calcium 9.3 8.9 - 10.3 mg/dL   Total Protein 6.7 6.5 - 8.1 g/dL   Albumin 3.1 (L) 3.5 - 5.0 g/dL   AST 16 15 - 41 U/L   ALT 11 0 - 44 U/L   Alkaline Phosphatase 83 38 - 126 U/L   Total Bilirubin 0.4 0.3 - 1.2 mg/dL   GFR calc non Af Amer 5 (L) >60 mL/min   GFR calc Af  Amer 6 (L) >60 mL/min   Anion gap 15 5 - 15    Comment: Performed at [facility], [place] [address], [place], [place] [address]  SARS CORONAVIRUS 2 Nasal Swab Aptima Multi Swab     Status: None   Collection Time: 09/10/18  8:29 PM   Specimen: Aptima Multi Swab; Nasal Swab  Result Value Ref Range   SARS Coronavirus 2 NEGATIVE NEGATIVE    Comment: (NOTE) SARS-CoV-2 target nucleic acids are NOT DETECTED. The SARS-CoV-2 RNA is generally detectable in upper and lower respiratory specimens during the acute phase of infection. Negative results do not preclude SARS-CoV-2 infection, do not rule out co-infections with other pathogens, and should not be used as the sole basis for treatment or other patient management decisions. Negative results must be combined with clinical observations, patient history, and epidemiological information. The expected result is Negative. Fact Sheet for Patients: SugarRoll.be Fact Sheet for Healthcare Providers: https://www.woods-mathews.com/ This test is not yet approved or cleared by the [place] FDA and  has been authorized for detection and/or diagnosis of SARS-CoV-2 by FDA under an Emergency Use Authorization (EUA). This EUA will remain  in effect (meaning this test can be used) for the duration of the COVID-19 declaration under Section 56 4(b)(1) of the Act, 21 U.S.C. section 360bbb-3(b)(1), unless the authorization is terminated or revoked sooner. Performed at [facility], [place] [address], [place], [place] [address]   MRSA PCR Screening     Status: None   Collection Time: 09/11/18 12:34 AM   Specimen: Nasopharyngeal  Result Value Ref Range   MRSA by PCR NEGATIVE NEGATIVE    Comment:        The GeneXpert MRSA Assay (FDA approved for NASAL specimens only), is one component of a comprehensive MRSA colonization surveillance program. It is not intended to diagnose MRSA infection nor to guide  or monitor treatment for MRSA infections. Performed at [facility], [place] [address], [place], [place] [address]   Comprehensive metabolic panel     Status: Abnormal   Collection Time: 09/11/18  4:08 AM  Result Value Ref Range   Sodium 136 135 - 145 mmol/L   Potassium 4.0 3.5 - 5.1 mmol/L   Chloride 98 98 - 111 mmol/L   CO2 26 22 - 32 mmol/L   Glucose, Bld 103 (H) 70 -  99 mg/dL   BUN 29 (H) 8 - 23 mg/dL   Creatinine, Ser 7.90 (H) 0.44 - 1.00 mg/dL   Calcium 9.1 8.9 - 10.3 mg/dL   Total Protein 5.8 (L) 6.5 - 8.1 g/dL   Albumin 2.9 (L) 3.5 - 5.0 g/dL   AST 21 15 - 41 U/L   ALT 12 0 - 44 U/L   Alkaline Phosphatase 66 38 - 126 U/L   Total Bilirubin 0.7 0.3 - 1.2 mg/dL   GFR calc non Af Amer 4 (L) >60 mL/min   GFR calc Af Amer 5 (L) >60 mL/min   Anion gap 12 5 - 15    Comment: Performed at [facility] [address], [place], [place] [address]  CBC     Status: Abnormal   Collection Time: 09/11/18  4:08 AM  Result Value Ref Range   WBC 11.6 (H) 4.0 - 10.5 K/uL   RBC 3.79 (L) 3.87 - 5.11 MIL/uL   Hemoglobin 12.4 12.0 - 15.0 g/dL   HCT 36.4 36.0 - 46.0 %   MCV 96.0 80.0 - 100.0 fL   MCH 32.7 26.0 - 34.0 pg   MCHC 34.1 30.0 - 36.0 g/dL   RDW 13.2 11.5 - 15.5 %   Platelets 154 150 - 400 K/uL   nRBC 0.0 0.0 - 0.2 %    Comment: Performed at [facility], [place] [address] [place], [place] [address]    Dg Chest 1 View  Result Date: 09/10/2018 CLINICAL DATA:  Fall EXAM: CHEST  1 VIEW COMPARISON:  08/07/2018 FINDINGS: Mild bilateral interstitial opacities, slightly worse on the left, unchanged from the prior study. No focal airspace consolidation. No pneumothorax. Small left pleural effusion. IMPRESSION: Mild interstitial pulmonary edema and small left pleural effusion, unchanged. Electronically Signed   By: [name] M.D.   On: 09/10/2018 21:31   Dg Hip Unilat With Pelvis 2-3 Views Left  Result Date: 09/10/2018 CLINICAL DATA:  84 year old female with left hip  pain after fall. EXAM: DG HIP (WITH OR WITHOUT PELVIS) 2-3V LEFT COMPARISON:  CT Abdomen and Pelvis 03/09/2017. FINDINGS: There is an acute left femoral neck fracture with varus impaction. The intertrochanteric segment of the proximal left femur appears to remain intact. The left femoral head remains normally located. The pelvis appears intact. Grossly intact proximal right femur. Negative visible bowel gas pattern. Aortoiliac calcified atherosclerosis. IMPRESSION: Acute left femoral neck fracture with varus impaction. Electronically Signed   By: Genevie Ann M.D.   On: 09/10/2018 21:24   Dg Femur Min 2 Views Left  Result Date: 09/10/2018 CLINICAL DATA:  84 year old female with left hip fracture after fall. EXAM: LEFT FEMUR 2 VIEWS COMPARISON:  Left hip series today. FINDINGS: Cross-table AP and lateral views of the mid and distal left femur are provided. No additional left femur fracture is identified. Alignment appears preserved at the left knee. No knee joint effusion is evident. The patella appears intact. IMPRESSION: Mid and distal left femur remain intact. Electronically Signed   By: Genevie Ann M.D.   On: 09/10/2018 21:26    Review of Systems  Constitutional: Negative for weight loss.  HENT: Negative for ear discharge, ear pain, hearing loss and tinnitus.   Eyes: Negative for blurred vision, double vision, photophobia and pain.  Respiratory: Negative for cough, sputum production and shortness of breath.   Cardiovascular: Negative for chest pain.  Gastrointestinal: Negative for abdominal pain, nausea and vomiting.  Genitourinary: Negative for dysuria, flank pain, frequency  and urgency.  Musculoskeletal: Positive for joint pain (Left hip). Negative for back pain, falls, myalgias and neck pain.  Neurological: Negative for dizziness, tingling, sensory change, focal weakness, loss of consciousness and headaches.  Endo/Heme/Allergies: Does not bruise/bleed easily.  Psychiatric/Behavioral: Negative for  depression, memory loss and substance abuse. The patient is not nervous/anxious.    Blood pressure (!) 124/57, pulse 74, temperature 99.1 F (37.3 C), temperature source Oral, resp. rate 16, height 5\' 5"  (1.651 m), weight 65 kg, SpO2 95 %. Physical Exam  Constitutional: She appears well-developed and well-nourished. No distress.  HENT:  Head: Normocephalic and atraumatic.  Eyes: Conjunctivae are normal. Right eye exhibits no discharge. Left eye exhibits no discharge. No scleral icterus.  Neck: Normal range of motion.  Cardiovascular: Normal rate and regular rhythm.  Respiratory: Effort normal. No respiratory distress.  Musculoskeletal:     Comments: LLE No traumatic wounds, ecchymosis, or rash  Mild TTP hip  No knee or ankle effusion  Knee stable to varus/ valgus and anterior/posterior stress  Sens DPN, SPN, TN intact  Motor EHL, ext, flex, evers 5/5  DP 2+, PT 1+, No significant edema  Neurological: She is alert.  Skin: Skin is warm and dry. She is not diaphoretic.  Psychiatric: She has a normal mood and affect. Her behavior is normal.    Assessment/Plan: Left hip fx -- For THA today by Dr. Lyla Glassing. Please keep NPO. Multiple medical problems including ESRD on HD, HLD, hypothyroidism, AAA, and HTN -- per primary service    Kristin Abu, PA-C Orthopedic Surgery [phone] 09/11/2018, 11:01 AM

## 2018-09-12 ENCOUNTER — Encounter (HOSPITAL_COMMUNITY): Payer: Self-pay | Admitting: Orthopedic Surgery

## 2018-09-12 DIAGNOSIS — D62 Acute posthemorrhagic anemia: Secondary | ICD-10-CM

## 2018-09-12 LAB — BASIC METABOLIC PANEL
Anion gap: 11 (ref 5–15)
BUN: 16 mg/dL (ref 8–23)
CO2: 26 mmol/L (ref 22–32)
Calcium: 8.3 mg/dL — ABNORMAL LOW (ref 8.9–10.3)
Chloride: 100 mmol/L (ref 98–111)
Creatinine, Ser: 4.28 mg/dL — ABNORMAL HIGH (ref 0.44–1.00)
GFR calc Af Amer: 10 mL/min — ABNORMAL LOW (ref >60)
GFR calc non Af Amer: 9 mL/min — ABNORMAL LOW (ref >60)
Glucose, Bld: 112 mg/dL — ABNORMAL HIGH (ref 70–99)
Potassium: 4.8 mmol/L (ref 3.5–5.1)
Sodium: 137 mmol/L (ref 135–145)

## 2018-09-12 LAB — CBC
HCT: 30.9 % — ABNORMAL LOW (ref 36.0–46.0)
Hemoglobin: 10 g/dL — ABNORMAL LOW (ref 12.0–15.0)
MCH: 32.5 pg (ref 26.0–34.0)
MCHC: 32.4 g/dL (ref 30.0–36.0)
MCV: 100.3 fL — ABNORMAL HIGH (ref 80.0–100.0)
Platelets: 131 10*3/uL — ABNORMAL LOW (ref 150–400)
RBC: 3.08 MIL/uL — ABNORMAL LOW (ref 3.87–5.11)
RDW: 13.4 % (ref 11.5–15.5)
WBC: 12.1 10*3/uL — ABNORMAL HIGH (ref 4.0–10.5)
nRBC: 0 % (ref 0.0–0.2)

## 2018-09-12 LAB — HEPATITIS PANEL, ACUTE
HCV Ab: <0.1 s/co ratio (ref 0.0–0.9)
Hep A IgM: NEGATIVE
Hep B C IgM: NEGATIVE
Hepatitis B Surface Ag: NEGATIVE

## 2018-09-12 LAB — HEPATITIS B CORE ANTIBODY, TOTAL: Hep B Core Total Ab: NEGATIVE

## 2018-09-12 LAB — HEPATITIS B E ANTIGEN: Hep B E Ag: NEGATIVE

## 2018-09-12 LAB — HEPATITIS B SURFACE ANTIBODY, QUANTITATIVE: Hep B S AB Quant (Post): <3.1 m[IU]/mL — ABNORMAL LOW (ref >9.9)

## 2018-09-12 MED ORDER — CHLORHEXIDINE GLUCONATE CLOTH 2 % EX PADS
6.0000 | MEDICATED_PAD | Freq: Every day | CUTANEOUS | Status: DC
  Administered 2018-09-12 – 2018-09-13 (×2): 6 via TOPICAL

## 2018-09-12 MED ORDER — ASPIRIN 81 MG PO CHEW: 81.0000 mg | CHEWABLE_TABLET | Freq: Two times a day (BID) | ORAL | 1 refills | Status: AC

## 2018-09-12 MED ORDER — DARBEPOETIN ALFA 25 MCG/0.42ML IJ SOSY
25.0000 ug | PREFILLED_SYRINGE | INTRAMUSCULAR | Status: DC
  Filled 2018-09-12: qty 0.42

## 2018-09-12 MED ORDER — HYDROCODONE-ACETAMINOPHEN 5-325 MG PO TABS: 1.0000 | ORAL_TABLET | ORAL | 0 refills | Status: AC | PRN

## 2018-09-12 MED ORDER — LIDOCAINE-PRILOCAINE 2.5-2.5 % EX CREA
1.0000 "application " | TOPICAL_CREAM | CUTANEOUS | Status: DC | PRN
  Filled 2018-09-12: qty 5

## 2018-09-12 MED ORDER — CALCITRIOL 0.25 MCG PO CAPS
0.2500 ug | ORAL_CAPSULE | ORAL | Status: DC
  Filled 2018-09-12: qty 1

## 2018-09-12 NOTE — Evaluation (Signed)
Physical Therapy Evaluation Patient Details Name: BIDDIE SEBEK MRN: [id number] DOB: Nov 24, 1934 Today's Date: 09/12/2018   History of Present Illness  Pt presents to hospital on 09/10/18 with L femoral fx following fall at home. Pt underwent L hip arthroplasty, anterior approach on 09/11/18. Pt with PMH significant for ESRD on dialysis (MWF), HTN, AAA. Pt now WBAT LLE.  Clinical Impression  Pt is doing well in regards to functional mobility as she is able to complete bed mobility, sit<>Stand transfers and gait with RW & min assist overall with pt only limited by decreased endurance & fatigue. Educated pt on LLE long arc quads & seated hip flexion to focus on strengthening & ROM. Pt would benefit from continued skilled PT treatment to focus on gait & stair negotiation as pt has 3 steps with R rail to enter home.     Follow Up Recommendations Home health PT;Supervision for mobility/OOB    Equipment Recommendations  None recommended by PT(pt reports she already has a RW at home)    Recommendations for Other Services       Precautions / Restrictions Precautions Precautions: Fall Restrictions Weight Bearing Restrictions: Yes LLE Weight Bearing: Weight bearing as tolerated      Mobility  Bed Mobility Overal bed mobility: Needs Assistance Bed Mobility: Supine to Sit     Supine to sit: Min assist     General bed mobility comments: to transition LLE to EOB  Transfers Overall transfer level: Needs assistance Equipment used: Rolling walker (2 wheeled) Transfers: Sit to/from Stand Sit to Stand: Min assist         General transfer comment: cuing for safe hand placement  Ambulation/Gait Ambulation/Gait assistance: Min assist Gait Distance (Feet): 30 Feet Assistive device: Rolling walker (2 wheeled)       General Gait Details: decreased weight shifting L, decreased gait speed  Stairs            Wheelchair Mobility    Modified Rankin (Stroke Patients Only)        Balance Overall balance assessment: Needs assistance           Standing balance-Leahy Scale: Poor Standing balance comment: requires RW for standing balance, min assist from therapist during gait                             Pertinent Vitals/Pain Pain Assessment: Faces Faces Pain Scale: Hurts a little bit Pain Descriptors / Indicators: Grimacing;Sore Pain Intervention(s): Limited activity within patient's tolerance;Monitored during session;Repositioned    Home Living Family/patient expects to be discharged to:: Private residence Living Arrangements: Spouse/significant other Available Help at Discharge: Available 24 hours/day;Family(daughter plans to take a couple weeks off work to stay with pt) Type of Home: House Home Access: Stairs to enter Entrance Stairs-Rails: Right Entrance Stairs-Number of Steps: 3 Home Layout: Multi-level;1/2 bath on main level(family has made bedroom on main level so pt can stay down there) Home Equipment: Walker - 2 wheels Additional Comments: Pt assists husband with cooking & other ADLs.    Prior Function Level of Independence: Independent         Comments: cares for husband (no physical assist though)     Hand Dominance        Extremity/Trunk Assessment                Communication   Communication: No difficulties  Cognition Arousal/Alertness: Awake/alert Behavior During Therapy: WFL for tasks assessed/performed Overall Cognitive Status: Within Functional  Limits for tasks assessed                                        General Comments General comments (skin integrity, edema, etc.): pt c/o SOB after gait but SpO2 WNL and therapist educated her on pursed lip breathing    Exercises     Assessment/Plan    PT Assessment Patient needs continued PT services  PT Problem List Decreased strength;Decreased mobility;Decreased coordination;Decreased range of motion;Decreased activity  tolerance;Cardiopulmonary status limiting activity;Decreased balance;Decreased knowledge of use of DME;Pain       PT Treatment Interventions DME instruction;Therapeutic activities;Gait training;Therapeutic exercise;Patient/family education;Stair training;Balance training;Neuromuscular re-education;Functional mobility training    PT Goals (Current goals can be found in the Care Plan section)  Acute Rehab PT Goals Patient Stated Goal: go home PT Goal Formulation: With patient Time For Goal Achievement: 09/26/18 Potential to Achieve Goals: Good    Frequency Min 5X/week   Barriers to discharge        Co-evaluation               AM-PAC PT "6 Clicks" Mobility  Outcome Measure Help needed turning from your back to your side while in a flat bed without using bedrails?: A Little Help needed moving from lying on your back to sitting on the side of a flat bed without using bedrails?: A Little Help needed moving to and from a bed to a chair (including a wheelchair)?: A Little Help needed standing up from a chair using your arms (e.g., wheelchair or bedside chair)?: A Little Help needed to walk in hospital room?: A Little Help needed climbing 3-5 steps with a railing? : A Little 6 Click Score: 18    End of Session Equipment Utilized During Treatment: Gait belt Activity Tolerance: Patient tolerated treatment well;Patient limited by fatigue Patient left: in chair;with chair alarm set;with call bell/phone within reach Nurse Communication: Mobility status PT Visit Diagnosis: Muscle weakness (generalized) (M62.81);Difficulty in walking, not elsewhere classified (R26.2)    Time: 1660-6301 PT Time Calculation (min) (ACUTE ONLY): 26 min   Charges:   PT Evaluation $PT Eval Moderate Complexity: 1 Mod PT Treatments $Therapeutic Activity: 8-22 mins          Waunita Schooner, PT, DPT 09/12/2018, 1:52 PM

## 2018-09-12 NOTE — TOC Initial Note (Signed)
Transition of Care [facility]) - Initial/Assessment Note    Patient Details  Name: Kristin Gross MRN: [id number] Date of Birth: 06/12/1934  Transition of Care [facility]) CM/SW Contact:    Carles Collet, RN Phone Number: 09/12/2018, 1:41 PM  Clinical Narrative:       Kristin Gross w patient and daughter at bedside. She would like to return home. She lives with spouse, her daughter will also be staying with her starting on Saturday, she lives a few minutes away. She would like a RW, has a 3/1 at home. Would like AHH, referral made. NEEDS order for [facility] RN             Expected Discharge Plan: [place] Barriers to Discharge: Continued Medical Work up   Patient Goals and CMS Choice Patient states their goals for this hospitalization and ongoing recovery are:: to go home, does not want SNF CMS Medicare.gov Compare Post Acute Care list provided to:: Patient Choice offered to / list presented to : Patient  Expected Discharge Plan and Services Expected Discharge Plan: [place]   Discharge Planning Services: CM Consult Post Acute Care Choice: Home Health, Durable Medical Equipment Living arrangements for the past 2 months: Single Family Home                 DME Arranged: Walker rolling DME Agency: [employer] Date DME Agency Contacted: 09/12/18 Time DME Agency Contacted: [id number] Representative spoke with at DME Agency: zack HH Arranged: RN [place] Agency: [place] ([place]) Date [place]: 09/12/18 Time [place]: 7 Representative spoke with at [place]: [place] Arrangements/Services Living arrangements for the past 2 months: [place] Lives with:: Spouse Patient language and need for interpreter reviewed:: Yes Do you feel safe going back to the place where you live?: Yes            Criminal Activity/Legal Involvement Pertinent to Current Situation/Hospitalization: No - Comment as needed  Activities of  Daily Living Home Assistive Devices/Equipment: Dentures (specify type), Eyeglasses ADL Screening (condition at time of admission) Patient's cognitive ability adequate to safely complete daily activities?: Yes Is the patient deaf or have difficulty hearing?: No Does the patient have difficulty seeing, even when wearing glasses/contacts?: No Does the patient have difficulty concentrating, remembering, or making decisions?: No Patient able to express need for assistance with ADLs?: Yes Does the patient have difficulty dressing or bathing?: No Independently performs ADLs?: Yes (appropriate for developmental age) Does the patient have difficulty walking or climbing stairs?: Yes Weakness of Legs: Left Weakness of Arms/Hands: None  Permission Sought/Granted                  Emotional Assessment Appearance:: Appears stated age Attitude/Demeanor/Rapport: Engaged Affect (typically observed): Calm Orientation: : Oriented to Self, Oriented to Place, Oriented to  Time, Oriented to Situation      Admission diagnosis:  Fall [W19.XXXA] ESRD on hemodialysis ([place]) [N18.6, Z99.2] Closed nondisplaced intertrochanteric fracture of left femur, initial encounter [facility]) [S72.145A] Patient Active Problem List   Diagnosis Date Noted  . Closed left femoral fracture ([place]) 09/10/2018  . Prolonged QT interval 08/07/2018  . Hypertension with fluid overload 08/07/2018  . Hypothyroidism 08/07/2018  . ESRD on dialysis ([place]) 08/26/2017  . AAA (abdominal aortic aneurysm) ([place]) 03/24/2017  . Preoperative cardiovascular examination 03/14/2017  . Hypertensive chronic kidney disease 03/14/2017  . AAA (abdominal aortic aneurysm) without rupture ([place]) 03/14/2017   PCP:  [name],  MD Pharmacy:   [facility] [address] [place] - [address] [place] [place] [address] Phone: [phone] Fax: [phone]     Social Determinants of Health (SDOH) Interventions     Readmission Risk Interventions No flowsheet data found.

## 2018-09-12 NOTE — Plan of Care (Signed)
  Problem: Activity: Goal: Ability to ambulate and perform ADLs will improve Outcome: Progressing   Problem: Education: Goal: Knowledge of General Education information will improve Description: Including pain rating scale, medication(s)/side effects and non-pharmacologic comfort measures Outcome: Progressing   Problem: Nutrition: Goal: Adequate nutrition will be maintained Outcome: Progressing   Problem: Safety: Goal: Ability to remain free from injury will improve Outcome: Progressing   Problem: Skin Integrity: Goal: Risk for impaired skin integrity will decrease Outcome: Progressing

## 2018-09-12 NOTE — Progress Notes (Signed)
[facility] PROGRESS NOTE  Kristin Gross WEX:[id number] DOB: [birth year] DOA: 09/10/2018  PCP: [name], MD  Brief History/Interval Summary: 84 y.o. female with medical history significant of end-stage renal disease on hemodialysis Mondays Wednesdays and Fridays, hypertension, hyperlipidemia, hypothyroidism, abdominal aortic aneurysm who sustained a mechanical fall at home.  Patient was sitting in the chair and got up to answer the phone and tripped and fell over her coffee table landing on the left hip.  She denied hitting her head or losing consciousness.  She was found to have left hip fracture.  She was hospitalized for further management.  Reason for Visit: Left hip fracture  Consultants: Orthopedics  Procedures:   Dialysis as per her usual schedule  Left hip total arthroplasty, anterior approach on 8/24.  Antibiotics: Anti-infectives (From admission, onward)   Start     Dose/Rate Route Frequency Ordered Stop   09/11/18 2200  ceFAZolin (ANCEF) IVPB 1 g/50 mL premix     1 g 100 mL/hr over 30 Minutes Intravenous  Once 09/11/18 1610 09/11/18 2321   09/11/18 1600  ceFAZolin (ANCEF) IVPB 2g/100 mL premix  Status:  Discontinued     2 g 200 mL/hr over 30 Minutes Intravenous Every 6 hours 09/11/18 1545 09/11/18 1609       Subjective/Interval History: Patient states that she is feeling well.  No significant pain in her lower extremities.  Denies any shortness of breath or chest pain.  States that she would like to go home all possible instead of skilled nursing facility.  She lives with her husband.  ROS: No nausea or vomiting  Objective:  Vital Signs  Vitals:   09/11/18 2001 09/11/18 2356 09/12/18 0531 09/12/18 0905  BP: (!) 139/55 (!) 129/53 (!) 154/58 (!) 157/59  Pulse: (!) 55 (!) 55 64 70  Resp: 14 14 15 18   Temp: 98.2 F (36.8 C) 97.8 F (36.6 C) 97.9 F (36.6 C) 97.7 F (36.5 C)  TempSrc:  Oral Oral Oral  SpO2: 100% 96% 91% 95%  Weight:        Height:        Intake/Output Summary (Last 24 hours) at 09/12/2018 1110 Last data filed at 09/12/2018 0500 Gross per 24 hour  Intake 790 ml  Output 400 ml  Net 390 ml   Filed Weights   09/10/18 2323 09/11/18 0650 09/11/18 1036  Weight: 54.4 kg 65 kg 64.5 kg    General appearance: Awake alert.  In no distress Resp: Clear to auscultation bilaterally.  Normal effort Cardio: S1-S2 is normal regular.  No S3-S4.  No rubs murmurs or bruit GI: Abdomen is soft.  Nontender nondistended.  Bowel sounds are present normal.  No masses organomegaly Extremities: No bruising noted over the left thigh area. Neurologic: Alert and oriented x3.  No focal neurological deficits.     Lab Results:  Data Reviewed: I have personally reviewed following labs and imaging studies  CBC: Recent Labs  Lab 09/10/18 2006 09/11/18 0408 09/12/18 0302  WBC 12.7* 11.6* 12.1*  NEUTROABS 9.6*  --   --   HGB 12.8 12.4 10.0*  HCT 38.2 36.4 30.9*  MCV 97.0 96.0 100.3*  PLT 167 154 131*    Basic Metabolic Panel: Recent Labs  Lab 09/10/18 2006 09/11/18 0408 09/12/18 0302  NA 137 136 137  K 3.8 4.0 4.8  CL 94* 98 100  CO2 28 26 26   GLUCOSE 111* 103* 112*  BUN 27* 29* 16  CREATININE 7.23* 7.90* 4.28*  CALCIUM 9.3 9.1 8.3*    GFR: Estimated Creatinine Clearance: 9.1 mL/min (A) (by C-G formula based on SCr of 4.28 mg/dL (H)).  Liver Function Tests: Recent Labs  Lab 09/10/18 2006 09/11/18 0408  AST 16 21  ALT 11 12  ALKPHOS 83 66  BILITOT 0.4 0.7  PROT 6.7 5.8*  ALBUMIN 3.1* 2.9*      Recent Results (from the past 240 hour(s))  SARS CORONAVIRUS 2 Nasal Swab Aptima Multi Swab     Status: None   Collection Time: 09/10/18  8:29 PM   Specimen: Aptima Multi Swab; Nasal Swab  Result Value Ref Range Status   SARS Coronavirus 2 NEGATIVE NEGATIVE Final    Comment: (NOTE) SARS-CoV-2 target nucleic acids are NOT DETECTED. The SARS-CoV-2 RNA is generally detectable in upper and lower respiratory  specimens during the acute phase of infection. Negative results do not preclude SARS-CoV-2 infection, do not rule out co-infections with other pathogens, and should not be used as the sole basis for treatment or other patient management decisions. Negative results must be combined with clinical observations, patient history, and epidemiological information. The expected result is Negative. Fact Sheet for Patients: SugarRoll.be Fact Sheet for Healthcare Providers: https://www.woods-mathews.com/ This test is not yet approved or cleared by the [place] FDA and  has been authorized for detection and/or diagnosis of SARS-CoV-2 by FDA under an Emergency Use Authorization (EUA). This EUA will remain  in effect (meaning this test can be used) for the duration of the COVID-19 declaration under Section 56 4(b)(1) of the Act, 21 U.S.C. section 360bbb-3(b)(1), unless the authorization is terminated or revoked sooner. Performed at [facility], [place] [address], [place], [place] [address]   MRSA PCR Screening     Status: None   Collection Time: 09/11/18 12:34 AM   Specimen: Nasopharyngeal  Result Value Ref Range Status   MRSA by PCR NEGATIVE NEGATIVE Final    Comment:        The GeneXpert MRSA Assay (FDA approved for NASAL specimens only), is one component of a comprehensive MRSA colonization surveillance program. It is not intended to diagnose MRSA infection nor to guide or monitor treatment for MRSA infections. Performed at [facility], [place] [address] [place], [place] [address]       Radiology Studies: Dg Chest 1 View  Result Date: 09/10/2018 CLINICAL DATA:  Fall EXAM: CHEST  1 VIEW COMPARISON:  08/07/2018 FINDINGS: Mild bilateral interstitial opacities, slightly worse on the left, unchanged from the prior study. No focal airspace consolidation. No pneumothorax. Small left pleural effusion. IMPRESSION: Mild interstitial  pulmonary edema and small left pleural effusion, unchanged. Electronically Signed   By: [name] M.D.   On: 09/10/2018 21:31   Pelvis Portable  Result Date: 09/11/2018 CLINICAL DATA:  Postop day 0 LEFT ANTERIOR approach total hip arthroplasty. EXAM: PORTABLE PELVIS 1-2 VIEWS 3:15 p.m.: COMPARISON:  Intraoperative LEFT hip x-rays earlier same day. Bone window images from CT abdomen and pelvis 03/09/2017. FINDINGS: Anatomic alignment of the LEFT hip prosthesis in the AP projection post total hip arthroplasty. No acute complicating features. Contralateral RIGHT hip normal in appearance. IMPRESSION: Anatomic alignment post LEFT total hip arthroplasty without acute complicating features. Electronically Signed   By: [name] M.D.   On: 09/11/2018 15:23   Dg C-arm 1-60 Min  Result Date: 09/11/2018 CLINICAL DATA:  Left hip surgery. EXAM: OPERATIVE LEFT HIP (WITH PELVIS IF PERFORMED) to VIEWS TECHNIQUE: Fluoroscopic spot image(s) were submitted for interpretation post-operatively.  COMPARISON:  09/10/2018. FINDINGS: Total left hip replacement.  Hardware intact.  Anatomic alignment. IMPRESSION: Total left hip replacement with anatomic alignment. Electronically Signed   By: Marcello Moores  Register   On: 09/11/2018 13:52   Dg Hip Operative Unilat W Or W/o Pelvis Left  Result Date: 09/11/2018 CLINICAL DATA:  Left hip surgery. EXAM: OPERATIVE LEFT HIP (WITH PELVIS IF PERFORMED) to VIEWS TECHNIQUE: Fluoroscopic spot image(s) were submitted for interpretation post-operatively. COMPARISON:  09/10/2018. FINDINGS: Total left hip replacement.  Hardware intact.  Anatomic alignment. IMPRESSION: Total left hip replacement with anatomic alignment. Electronically Signed   By: Marcello Moores  Register   On: 09/11/2018 13:52   Dg Hip Unilat With Pelvis 2-3 Views Left  Result Date: 09/10/2018 CLINICAL DATA:  84 year old female with left hip pain after fall. EXAM: DG HIP (WITH OR WITHOUT PELVIS) 2-3V LEFT COMPARISON:  CT Abdomen  and Pelvis 03/09/2017. FINDINGS: There is an acute left femoral neck fracture with varus impaction. The intertrochanteric segment of the proximal left femur appears to remain intact. The left femoral head remains normally located. The pelvis appears intact. Grossly intact proximal right femur. Negative visible bowel gas pattern. Aortoiliac calcified atherosclerosis. IMPRESSION: Acute left femoral neck fracture with varus impaction. Electronically Signed   By: Genevie Ann M.D.   On: 09/10/2018 21:24   Dg Femur Min 2 Views Left  Result Date: 09/10/2018 CLINICAL DATA:  84 year old female with left hip fracture after fall. EXAM: LEFT FEMUR 2 VIEWS COMPARISON:  Left hip series today. FINDINGS: Cross-table AP and lateral views of the mid and distal left femur are provided. No additional left femur fracture is identified. Alignment appears preserved at the left knee. No knee joint effusion is evident. The patella appears intact. IMPRESSION: Mid and distal left femur remain intact. Electronically Signed   By: Genevie Ann M.D.   On: 09/10/2018 21:26     Medications:  Scheduled:  aspirin  81 mg Oral BID WC   [START ON 09/13/2018] calcitRIOL  0.25 mcg Oral 3 times weekly   Chlorhexidine Gluconate Cloth  6 each Topical Q0600   [START ON 09/13/2018] darbepoetin (ARANESP) injection - DIALYSIS  25 mcg Intravenous Q Wed-HD   docusate sodium  100 mg Oral BID   feeding supplement (PRO-STAT SUGAR FREE 64)  30 mL Oral BID   nicotine  14 mg Transdermal Daily   Continuous:  MWN:[id number], HYDROcodone-acetaminophen, HYDROcodone-acetaminophen, HYDROmorphone (DILAUDID) injection, menthol-cetylpyridinium **OR** phenol, promethazine    Assessment/Plan:    Left hip fracture as a result of mechanical fall Patient underwent total hip arthroplasty on 8/24.  Seems to be stable this morning.  Denies any significant pain.  PT and OT evaluation.  Patient would like to avoid going to skilled nursing facility if possible  and would prefer to go home.  Was told that we will need to wait and see how she does with the physical and occupational therapist.  It appears like her orthopedics has started her on aspirin twice a day for DVT prophylaxis.  Pain control.  End-stage renal disease on hemodialysis on Monday Wednesday and Fridays Nephrology is following.  Patient being dialyzed as per her usual schedule.  Next dialysis tomorrow.  Anemia due to acute blood loss Likely due to operative loss.  Recheck hemoglobin tomorrow.  Essential hypertension Was hypertensive when she was admitted.  Most likely due to pain issues.  Blood pressure better subsequently.  Continue to monitor.    Hypothyroidism Continue levothyroxine.  Leukocytosis Likely reactive.  She is afebrile.  No  obvious source of infection.  Continue to trend.  Mild thrombocytopenia Mild drop in platelet counts noted this morning.  Recheck tomorrow.   DVT Prophylaxis: On aspirin twice a day per orthopedics Code Status: Full code Family Communication: Discussed with the patient Disposition Plan: Await PT and OT evaluation.  Patient would prefer to go home instead of skilled nursing facility.      LOS: 2 days   [facility] Pager on [email or web address]  09/12/2018, 11:10 AM

## 2018-09-12 NOTE — Progress Notes (Signed)
[facility] Progress Note   Subjective:  Seen in room. Feels good today - no pain or dyspnea. S/p L THA yesterday.    Objective Vitals:   09/11/18 1603 09/11/18 2001 09/11/18 2356 09/12/18 0531  BP: (!) 101/51 (!) 139/55 (!) 129/53 (!) 154/58  Pulse: (!) 52 (!) 55 (!) 55 64  Resp: 16 14 14 15   Temp: 97.9 F (36.6 C) 98.2 F (36.8 C) 97.8 F (36.6 C) 97.9 F (36.6 C)  TempSrc: Oral  Oral Oral  SpO2: 98% 100% 96% 91%  Weight:      Height:       Physical Exam General: Well appearing, elderly woman. NAD Heart: RRR; no murmur Lungs: CTAB Abdomen: soft, non-tender Extremities: No LE edema Dialysis Access: L AVF + thrill  Additional Objective Labs: Basic Metabolic Panel: Recent Labs  Lab 09/10/18 2006 09/11/18 0408 09/12/18 0302  NA 137 136 137  K 3.8 4.0 4.8  CL 94* 98 100  CO2 28 26 26   GLUCOSE 111* 103* 112*  BUN 27* 29* 16  CREATININE 7.23* 7.90* 4.28*  CALCIUM 9.3 9.1 8.3*   Liver Function Tests: Recent Labs  Lab 09/10/18 2006 09/11/18 0408  AST 16 21  ALT 11 12  ALKPHOS 83 66  BILITOT 0.4 0.7  PROT 6.7 5.8*  ALBUMIN 3.1* 2.9*   CBC: Recent Labs  Lab 09/10/18 2006 09/11/18 0408 09/12/18 0302  WBC 12.7* 11.6* 12.1*  NEUTROABS 9.6*  --   --   HGB 12.8 12.4 10.0*  HCT 38.2 36.4 30.9*  MCV 97.0 96.0 100.3*  PLT 167 154 131*   Studies/Results: Dg Chest 1 View  Result Date: 09/10/2018 CLINICAL DATA:  Fall EXAM: CHEST  1 VIEW COMPARISON:  08/07/2018 FINDINGS: Mild bilateral interstitial opacities, slightly worse on the left, unchanged from the prior study. No focal airspace consolidation. No pneumothorax. Small left pleural effusion. IMPRESSION: Mild interstitial pulmonary edema and small left pleural effusion, unchanged. Electronically Signed   By: [name] M.D.   On: 09/10/2018 21:31   Pelvis Portable  Result Date: 09/11/2018 CLINICAL DATA:  Postop day 0 LEFT ANTERIOR approach total hip arthroplasty. EXAM: PORTABLE PELVIS  1-2 VIEWS 3:15 p.m.: COMPARISON:  Intraoperative LEFT hip x-rays earlier same day. Bone window images from CT abdomen and pelvis 03/09/2017. FINDINGS: Anatomic alignment of the LEFT hip prosthesis in the AP projection post total hip arthroplasty. No acute complicating features. Contralateral RIGHT hip normal in appearance. IMPRESSION: Anatomic alignment post LEFT total hip arthroplasty without acute complicating features. Electronically Signed   By: [name] M.D.   On: 09/11/2018 15:23   Dg C-arm 1-60 Min  Result Date: 09/11/2018 CLINICAL DATA:  Left hip surgery. EXAM: OPERATIVE LEFT HIP (WITH PELVIS IF PERFORMED) to VIEWS TECHNIQUE: Fluoroscopic spot image(s) were submitted for interpretation post-operatively. COMPARISON:  09/10/2018. FINDINGS: Total left hip replacement.  Hardware intact.  Anatomic alignment. IMPRESSION: Total left hip replacement with anatomic alignment. Electronically Signed   By: Marcello Moores  Register   On: 09/11/2018 13:52   Dg Hip Operative Unilat W Or W/o Pelvis Left  Result Date: 09/11/2018 CLINICAL DATA:  Left hip surgery. EXAM: OPERATIVE LEFT HIP (WITH PELVIS IF PERFORMED) to VIEWS TECHNIQUE: Fluoroscopic spot image(s) were submitted for interpretation post-operatively. COMPARISON:  09/10/2018. FINDINGS: Total left hip replacement.  Hardware intact.  Anatomic alignment. IMPRESSION: Total left hip replacement with anatomic alignment. Electronically Signed   By: Marcello Moores  Register   On: 09/11/2018 13:52   Dg Hip Unilat With Pelvis 2-3  Views Left  Result Date: 09/10/2018 CLINICAL DATA:  84 year old female with left hip pain after fall. EXAM: DG HIP (WITH OR WITHOUT PELVIS) 2-3V LEFT COMPARISON:  CT Abdomen and Pelvis 03/09/2017. FINDINGS: There is an acute left femoral neck fracture with varus impaction. The intertrochanteric segment of the proximal left femur appears to remain intact. The left femoral head remains normally located. The pelvis appears intact. Grossly intact  proximal right femur. Negative visible bowel gas pattern. Aortoiliac calcified atherosclerosis. IMPRESSION: Acute left femoral neck fracture with varus impaction. Electronically Signed   By: Genevie Ann M.D.   On: 09/10/2018 21:24   Dg Femur Min 2 Views Left  Result Date: 09/10/2018 CLINICAL DATA:  84 year old female with left hip fracture after fall. EXAM: LEFT FEMUR 2 VIEWS COMPARISON:  Left hip series today. FINDINGS: Cross-table AP and lateral views of the mid and distal left femur are provided. No additional left femur fracture is identified. Alignment appears preserved at the left knee. No knee joint effusion is evident. The patella appears intact. IMPRESSION: Mid and distal left femur remain intact. Electronically Signed   By: Genevie Ann M.D.   On: 09/10/2018 21:26   Medications:  . aspirin  81 mg Oral BID WC  . docusate sodium  100 mg Oral BID  . feeding supplement (PRO-STAT SUGAR FREE 64)  30 mL Oral BID  . nicotine  14 mg Transdermal Daily    Dialysis Orders: MWF at [facility] 4hr, 400/800, EDW 55kg, 2K/2Ca, UFP #2, AVF, heparin 2000 - Venofer 50mg  weekly - Calcitriol 0.31mcg PO q HD - No ESA  Assessment/Plan: 1.  L femoral neck fracture (s/p fall at home): S/p THA 8/24. Will need PT/OT. 2.  ESRD: Continue HD per MWF schedule - next 8/26. No heparin. 3.  Hypertension/volume: BP slightly high, no edema. Will re-evaluate EDW tomorrow. 4.  Anemia: Hgb down to 10 post-op. Will give low dose ESA with next HD. 5.  Metabolic bone disease: Ca ok, continue home meds/VDRA. 6.  Nutrition: Alb low, continue supplements. 7.  Hypothyroidism  Veneta Penton, PA-C 09/12/2018, 9:25 AM  [facility] Pager: [phone]

## 2018-09-12 NOTE — Progress Notes (Signed)
    Subjective:  Patient reports pain as mild.  Denies N/V/CP/SOB. No c/o.  Objective:   VITALS:   Vitals:   09/11/18 2001 09/11/18 2356 09/12/18 0531 09/12/18 0905  BP: (!) 139/55 (!) 129/53 (!) 154/58 (!) 157/59  Pulse: (!) 55 (!) 55 64 70  Resp: 14 14 15 18   Temp: 98.2 F (36.8 C) 97.8 F (36.6 C) 97.9 F (36.6 C) 97.7 F (36.5 C)  TempSrc:  Oral Oral Oral  SpO2: 100% 96% 91% 95%  Weight:      Height:        NAD ABD soft Sensation intact distally Intact pulses distally Dorsiflexion/Plantar flexion intact Incision: dressing C/D/I Compartment soft   Lab Results  Component Value Date   WBC 12.1 (H) 09/12/2018   HGB 10.0 (L) 09/12/2018   HCT 30.9 (L) 09/12/2018   MCV 100.3 (H) 09/12/2018   PLT 131 (L) 09/12/2018   BMET    Component Value Date/Time   NA 137 09/12/2018 0302   K 4.8 09/12/2018 0302   CL 100 09/12/2018 0302   CO2 26 09/12/2018 0302   GLUCOSE 112 (H) 09/12/2018 0302   BUN 16 09/12/2018 0302   CREATININE 4.28 (H) 09/12/2018 0302   CALCIUM 8.3 (L) 09/12/2018 0302   GFRNONAA 9 (L) 09/12/2018 0302   GFRAA 10 (L) 09/12/2018 0302     Assessment/Plan: 1 Day Post-Op   Principal Problem:   Closed left femoral fracture (HCC) Active Problems:   Hypertensive chronic kidney disease   AAA (abdominal aortic aneurysm) without rupture (HCC)   ESRD on dialysis ([place])   Hypertension with fluid overload   Hypothyroidism   WBAT with walker DVT ppx: Aspirin, SCDs, TEDS PO pain control PT/OT Dispo: D/C home when ok with hospitalist   Kristin Gross 09/12/2018, 12:07 PM   Rod Can, MD Cell: [phone] [facility]  [place] [address], [address], [place], [place] [address] Phone: [phone] [email or web address] Facebook  [employer]

## 2018-09-13 DIAGNOSIS — I120 Hypertensive chronic kidney disease with stage 5 chronic kidney disease or end stage renal disease: Secondary | ICD-10-CM

## 2018-09-13 LAB — CBC
HCT: 26.6 % — ABNORMAL LOW (ref 36.0–46.0)
Hemoglobin: 8.6 g/dL — ABNORMAL LOW (ref 12.0–15.0)
MCH: 32.3 pg (ref 26.0–34.0)
MCHC: 32.3 g/dL (ref 30.0–36.0)
MCV: 100 fL (ref 80.0–100.0)
Platelets: 135 10*3/uL — ABNORMAL LOW (ref 150–400)
RBC: 2.66 MIL/uL — ABNORMAL LOW (ref 3.87–5.11)
RDW: 13.9 % (ref 11.5–15.5)
WBC: 9.6 10*3/uL (ref 4.0–10.5)
nRBC: 0 % (ref 0.0–0.2)

## 2018-09-13 LAB — BASIC METABOLIC PANEL
Anion gap: 12 (ref 5–15)
BUN: 30 mg/dL — ABNORMAL HIGH (ref 8–23)
CO2: 25 mmol/L (ref 22–32)
Calcium: 8.5 mg/dL — ABNORMAL LOW (ref 8.9–10.3)
Chloride: 99 mmol/L (ref 98–111)
Creatinine, Ser: 6.25 mg/dL — ABNORMAL HIGH (ref 0.44–1.00)
GFR calc Af Amer: 7 mL/min — ABNORMAL LOW (ref >60)
GFR calc non Af Amer: 6 mL/min — ABNORMAL LOW (ref >60)
Glucose, Bld: 88 mg/dL (ref 70–99)
Potassium: 4.7 mmol/L (ref 3.5–5.1)
Sodium: 136 mmol/L (ref 135–145)

## 2018-09-13 MED ORDER — DARBEPOETIN ALFA 100 MCG/0.5ML IJ SOSY
PREFILLED_SYRINGE | INTRAMUSCULAR | Status: AC
  Administered 2018-09-13: 100 ug via INTRAVENOUS
  Filled 2018-09-13: qty 0.5

## 2018-09-13 MED ORDER — DARBEPOETIN ALFA 100 MCG/0.5ML IJ SOSY
100.0000 ug | PREFILLED_SYRINGE | INTRAMUSCULAR | Status: DC
  Administered 2018-09-13: 12:00:00 100 ug via INTRAVENOUS
  Filled 2018-09-13: qty 0.5

## 2018-09-13 MED ORDER — HYDRALAZINE HCL 20 MG/ML IJ SOLN
5.0000 mg | Freq: Once | INTRAMUSCULAR | Status: AC
  Administered 2018-09-13: 06:00:00 5 mg via INTRAVENOUS
  Filled 2018-09-13: qty 1

## 2018-09-13 NOTE — Discharge Summary (Signed)
Physician Discharge Summary  Kristin Gross QIW:[id number] DOB: 05-Oct-1934 DOA: 09/10/2018  PCP: [name], MD  Admit date: 09/10/2018 Discharge date: 09/13/2018  Admitted From: Home Disposition: Home with home health  Recommendations for Outpatient Follow-up:  1. Follow up with PCP in 1-2 weeks 2. Please obtain BMP/CBC in one week  Home Health: HHPT  Equipment/Devices:None  Discharge Condition: Stable CODE STATUS: Full Diet recommendation: Renal diet  Brief/Interim Summary: 84 y.o.femalewith medical history significant ofend-stage renal disease on hemodialysis Mondays Wednesdays and Fridays, hypertension, hyperlipidemia, hypothyroidism, abdominal aortic aneurysm who sustained a mechanical fall at home. Patient was sitting in the chair and got up to answer the phone and tripped and fell over her coffee table landing on the left hip. She denied hitting her head or losing consciousness.  She was found to have left hip fracture.  She was hospitalized for further management.  Patient admitted as above after mechanical fall with left hip fracture, now status post ORIF on 824 with orthopedic surgery.  Patient tolerated procedure well, now ambulating with minimal difficulty with rolling walker.  PT recommending further therapy at home via home health.  Orthopedics recommends aspirin twice daily for DVT prophylaxis, otherwise patient's chronic comorbid conditions as below remain well controlled, continues to follow with nephrology for dialysis on Monday Wednesday Fridays.  Patient at this time otherwise medically stable and agreeable for discharge home.  Discharge Diagnoses:  Principal Problem:   Closed left femoral fracture (HCC) Active Problems:   Hypertensive chronic kidney disease   AAA (abdominal aortic aneurysm) without rupture (HCC)   ESRD on dialysis [facility])   Hypertension with fluid overload   Hypothyroidism  Discharge Instructions Discharge Instructions    Diet - low  sodium heart healthy   Complete by: As directed    Increase activity slowly   Complete by: As directed      Allergies as of 09/13/2018      Reactions   Lisinopril Swelling   SWELLING REACTION UNSPECIFIED       Medication List    STOP taking these medications   aspirin 81 MG tablet Replaced by: aspirin 81 MG chewable tablet     TAKE these medications   amLODipine 5 MG tablet Commonly known as: NORVASC Take 5 mg by mouth daily. as directed   aspirin 81 MG chewable tablet Chew 1 tablet (81 mg total) by mouth 2 (two) times daily with a meal. Replaces: aspirin 81 MG tablet   cholecalciferol 1000 units tablet Commonly known as: VITAMIN D Take 1,000 Units by mouth daily.   HYDROcodone-acetaminophen 5-325 MG tablet Commonly known as: NORCO/VICODIN Take 1 tablet by mouth every 4 (four) hours as needed for moderate pain (pain score 4-6).   levothyroxine 100 MCG tablet Commonly known as: SYNTHROID Take 100 mcg by mouth daily before breakfast.   metoprolol tartrate 50 MG tablet Commonly known as: LOPRESSOR Take 50 mg by mouth daily.   rosuvastatin 10 MG tablet Commonly known as: CRESTOR Take 10 mg by mouth daily.            Durable Medical Equipment  (From admission, onward)         Start     Ordered   09/12/18 1346  For home use only DME Walker rolling  Once    Question:  Patient needs a walker to treat with the following condition  Answer:  Weakness   09/12/18 1345         Follow-up Information    Swinteck, Kristin Edelman, MD.  Schedule an appointment as soon as possible for a visit in 2 weeks.   Specialty: Orthopedic Surgery Why: For wound re-check Contact information: [address] [place] [address] [phone]          Allergies  Allergen Reactions  . Lisinopril Swelling    SWELLING REACTION UNSPECIFIED      Consultations:  Orthopedic surgery   Procedures/Studies: Dg Chest 1 View  Result Date: 09/10/2018 CLINICAL DATA:   Fall EXAM: CHEST  1 VIEW COMPARISON:  08/07/2018 FINDINGS: Mild bilateral interstitial opacities, slightly worse on the left, unchanged from the prior study. No focal airspace consolidation. No pneumothorax. Small left pleural effusion. IMPRESSION: Mild interstitial pulmonary edema and small left pleural effusion, unchanged. Electronically Signed   By: [name] M.D.   On: 09/10/2018 21:31   Pelvis Portable  Result Date: 09/11/2018 CLINICAL DATA:  Postop day 0 LEFT ANTERIOR approach total hip arthroplasty. EXAM: PORTABLE PELVIS 1-2 VIEWS 3:15 p.m.: COMPARISON:  Intraoperative LEFT hip x-rays earlier same day. Bone window images from CT abdomen and pelvis 03/09/2017. FINDINGS: Anatomic alignment of the LEFT hip prosthesis in the AP projection post total hip arthroplasty. No acute complicating features. Contralateral RIGHT hip normal in appearance. IMPRESSION: Anatomic alignment post LEFT total hip arthroplasty without acute complicating features. Electronically Signed   By: [name] M.D.   On: 09/11/2018 15:23   Dg C-arm 1-60 Min  Result Date: 09/11/2018 CLINICAL DATA:  Left hip surgery. EXAM: OPERATIVE LEFT HIP (WITH PELVIS IF PERFORMED) to VIEWS TECHNIQUE: Fluoroscopic spot image(s) were submitted for interpretation post-operatively. COMPARISON:  09/10/2018. FINDINGS: Total left hip replacement.  Hardware intact.  Anatomic alignment. IMPRESSION: Total left hip replacement with anatomic alignment. Electronically Signed   By: Marcello Moores  Register   On: 09/11/2018 13:52   Dg Hip Operative Unilat W Or W/o Pelvis Left  Result Date: 09/11/2018 CLINICAL DATA:  Left hip surgery. EXAM: OPERATIVE LEFT HIP (WITH PELVIS IF PERFORMED) to VIEWS TECHNIQUE: Fluoroscopic spot image(s) were submitted for interpretation post-operatively. COMPARISON:  09/10/2018. FINDINGS: Total left hip replacement.  Hardware intact.  Anatomic alignment. IMPRESSION: Total left hip replacement with anatomic alignment.  Electronically Signed   By: Marcello Moores  Register   On: 09/11/2018 13:52   Dg Hip Unilat With Pelvis 2-3 Views Left  Result Date: 09/10/2018 CLINICAL DATA:  84 year old female with left hip pain after fall. EXAM: DG HIP (WITH OR WITHOUT PELVIS) 2-3V LEFT COMPARISON:  CT Abdomen and Pelvis 03/09/2017. FINDINGS: There is an acute left femoral neck fracture with varus impaction. The intertrochanteric segment of the proximal left femur appears to remain intact. The left femoral head remains normally located. The pelvis appears intact. Grossly intact proximal right femur. Negative visible bowel gas pattern. Aortoiliac calcified atherosclerosis. IMPRESSION: Acute left femoral neck fracture with varus impaction. Electronically Signed   By: Genevie Ann M.D.   On: 09/10/2018 21:24   Dg Femur Min 2 Views Left  Result Date: 09/10/2018 CLINICAL DATA:  84 year old female with left hip fracture after fall. EXAM: LEFT FEMUR 2 VIEWS COMPARISON:  Left hip series today. FINDINGS: Cross-table AP and lateral views of the mid and distal left femur are provided. No additional left femur fracture is identified. Alignment appears preserved at the left knee. No knee joint effusion is evident. The patella appears intact. IMPRESSION: Mid and distal left femur remain intact. Electronically Signed   By: Genevie Ann M.D.   On: 09/10/2018 21:26    Subjective:  No acute issues or events overnight.  Denies chest pain, shortness of breath, nausea, vomiting, diarrhea, constipation, headache, fevers, chills.   Discharge Exam: Vitals:   09/13/18 1100 09/13/18 1144  BP: (!) 122/43 (!) 126/59  Pulse: 96 (!) 102  Resp:  16  Temp:  98.9 F (37.2 C)  SpO2:  93%   Vitals:   09/13/18 1000 09/13/18 1030 09/13/18 1100 09/13/18 1144  BP: (!) 100/37 (!) 123/54 (!) 122/43 (!) 126/59  Pulse: 89 92 96 (!) 102  Resp:    16  Temp:    98.9 F (37.2 C)  TempSrc:    Oral  SpO2:    93%  Weight:    63 kg  Height:        General:  Pleasantly resting  in bed, No acute distress. HEENT:  Normocephalic atraumatic.  Sclerae nonicteric, noninjected.  Extraocular movements intact bilaterally. Neck:  Without mass or deformity.  Trachea is midline. Lungs:  Clear to auscultate bilaterally without rhonchi, wheeze, or rales. Heart:  Regular rate and rhythm.  Without murmurs, rubs, or gallops. Abdomen:  Soft, nontender, nondistended.  Without guarding or rebound. Extremities: Without cyanosis, clubbing, edema, or obvious deformity. Vascular:  Dorsalis pedis and posterior tibial pulses palpable bilaterally. Skin:  Warm and dry, no erythema, no ulcerations.   The results of significant diagnostics from this hospitalization (including imaging, microbiology, ancillary and laboratory) are listed below for reference.     Microbiology: Recent Results (from the past 240 hour(s))  SARS CORONAVIRUS 2 Nasal Swab Aptima Multi Swab     Status: None   Collection Time: 09/10/18  8:29 PM   Specimen: Aptima Multi Swab; Nasal Swab  Result Value Ref Range Status   SARS Coronavirus 2 NEGATIVE NEGATIVE Final    Comment: (NOTE) SARS-CoV-2 target nucleic acids are NOT DETECTED. The SARS-CoV-2 RNA is generally detectable in upper and lower respiratory specimens during the acute phase of infection. Negative results do not preclude SARS-CoV-2 infection, do not rule out co-infections with other pathogens, and should not be used as the sole basis for treatment or other patient management decisions. Negative results must be combined with clinical observations, patient history, and epidemiological information. The expected result is Negative. Fact Sheet for Patients: SugarRoll.be Fact Sheet for Healthcare Providers: https://www.woods-mathews.com/ This test is not yet approved or cleared by the [place] FDA and  has been authorized for detection and/or diagnosis of SARS-CoV-2 by FDA under an Emergency Use Authorization  (EUA). This EUA will remain  in effect (meaning this test can be used) for the duration of the COVID-19 declaration under Section 56 4(b)(1) of the Act, 21 U.S.C. section 360bbb-3(b)(1), unless the authorization is terminated or revoked sooner. Performed at [facility], [place] [address] [place], [place] [address]   MRSA PCR Screening     Status: None   Collection Time: 09/11/18 12:34 AM   Specimen: Nasopharyngeal  Result Value Ref Range Status   MRSA by PCR NEGATIVE NEGATIVE Final    Comment:        The GeneXpert MRSA Assay (FDA approved for NASAL specimens only), is one component of a comprehensive MRSA colonization surveillance program. It is not intended to diagnose MRSA infection nor to guide or monitor treatment for MRSA infections. Performed at [facility], [place] [address] [place], [place] [address]      Labs:  Basic Metabolic Panel: Recent Labs  Lab 09/10/18 2006 09/11/18 0408 09/12/18 0302 09/13/18 0438  NA 137 136 137 136  K 3.8 4.0 4.8 4.7  CL 94* 98 100 99  CO2 28 26 26 25   GLUCOSE 111* 103* 112* 88  BUN 27* 29* 16 30*  CREATININE 7.23* 7.90* 4.28* 6.25*  CALCIUM 9.3 9.1 8.3* 8.5*   Liver Function Tests: Recent Labs  Lab 09/10/18 2006 09/11/18 0408  AST 16 21  ALT 11 12  ALKPHOS 83 66  BILITOT 0.4 0.7  PROT 6.7 5.8*  ALBUMIN 3.1* 2.9*   CBC: Recent Labs  Lab 09/10/18 2006 09/11/18 0408 09/12/18 0302 09/13/18 0438  WBC 12.7* 11.6* 12.1* 9.6  NEUTROABS 9.6*  --   --   --   HGB 12.8 12.4 10.0* 8.6*  HCT 38.2 36.4 30.9* 26.6*  MCV 97.0 96.0 100.3* 100.0  PLT 167 154 131* 135*   Urinalysis    Component Value Date/Time   COLORURINE YELLOW 03/21/2017 1344   APPEARANCEUR HAZY (A) 03/21/2017 1344   LABSPEC 1.014 03/21/2017 1344   PHURINE 5.0 03/21/2017 1344   GLUCOSEU NEGATIVE 03/21/2017 1344   HGBUR SMALL (A) 03/21/2017 1344   BILIRUBINUR NEGATIVE 03/21/2017 1344   KETONESUR NEGATIVE 03/21/2017 1344   PROTEINUR 30 (A)  03/21/2017 1344   NITRITE NEGATIVE 03/21/2017 1344   LEUKOCYTESUR TRACE (A) 03/21/2017 1344   Sepsis Labs Invalid input(s): PROCALCITONIN,  WBC,  LACTICIDVEN Microbiology Recent Results (from the past 240 hour(s))  SARS CORONAVIRUS 2 Nasal Swab Aptima Multi Swab     Status: None   Collection Time: 09/10/18  8:29 PM   Specimen: Aptima Multi Swab; Nasal Swab  Result Value Ref Range Status   SARS Coronavirus 2 NEGATIVE NEGATIVE Final    Comment: (NOTE) SARS-CoV-2 target nucleic acids are NOT DETECTED. The SARS-CoV-2 RNA is generally detectable in upper and lower respiratory specimens during the acute phase of infection. Negative results do not preclude SARS-CoV-2 infection, do not rule out co-infections with other pathogens, and should not be used as the sole basis for treatment or other patient management decisions. Negative results must be combined with clinical observations, patient history, and epidemiological information. The expected result is Negative. Fact Sheet for Patients: SugarRoll.be Fact Sheet for Healthcare Providers: https://www.woods-mathews.com/ This test is not yet approved or cleared by the [place] FDA and  has been authorized for detection and/or diagnosis of SARS-CoV-2 by FDA under an Emergency Use Authorization (EUA). This EUA will remain  in effect (meaning this test can be used) for the duration of the COVID-19 declaration under Section 56 4(b)(1) of the Act, 21 U.S.C. section 360bbb-3(b)(1), unless the authorization is terminated or revoked sooner. Performed at [facility], [place] [address] [place], [place] [address]   MRSA PCR Screening     Status: None   Collection Time: 09/11/18 12:34 AM   Specimen: Nasopharyngeal  Result Value Ref Range Status   MRSA by PCR NEGATIVE NEGATIVE Final    Comment:        The GeneXpert MRSA Assay (FDA approved for NASAL specimens only), is one component of  a comprehensive MRSA colonization surveillance program. It is not intended to diagnose MRSA infection nor to guide or monitor treatment for MRSA infections. Performed at [facility], [place] [address], [place], [place] [address]      Time coordinating discharge: Over 30 minutes  SIGNED:   Little Ishikawa, DO [facility] 09/13/2018, 12:31 PM Pager   If 7PM-7AM, please contact night-coverage [email or web address] Password [email or web address]

## 2018-09-13 NOTE — TOC Transition Note (Signed)
Transition of Care [facility]) - CM/SW Discharge Note Marvetta Gibbons RN,BSN Transitions of Care Unit 5N coverage - RN Case Manager [phone]   Patient Details  Name: [name] MRN: [id number] Date of Birth: 29-Sep-1934  Transition of Care [facility]) CM/SW Contact:  Dawayne Patricia, RN Phone Number: 09/13/2018, 1:31 PM   Clinical Narrative:    Pt stable for transition home today, per Zach with [place] has been delivered to room for discharge. Order for HHPT has been placed- pt has been referred to [facility] per choice- notified Mateo Flow with [facility] for start of care and HHPT needs-    Final next level of care: Home w Home Health Services Barriers to Discharge: No Barriers Identified, Barriers Resolved   Patient Goals and CMS Choice Patient states their goals for this hospitalization and ongoing recovery are:: to go home, does not want SNF CMS Medicare.gov Compare Post Acute Care list provided to:: Patient Choice offered to / list presented to : Patient  Discharge Placement         [facility]              Discharge Plan and Services   Discharge Planning Services: CM Consult Post Acute Care Choice: Home Health, Durable Medical Equipment          DME Arranged: Walker rolling DME Agency: [employer] Date DME Agency Contacted: 09/12/18 Time DME Agency Contacted: 1025 Representative spoke with at DME Agency: zack [place]: PT [place]: [place] ([place]) Date [place]: 09/13/18 Time [place]: [place] Representative spoke with at [place]: [place] ([place]) Interventions     Readmission Risk Interventions Readmission Risk Prevention Plan 09/13/2018  Transportation Screening Complete  PCP or Specialist Appt within 5-7 Days Complete  Home Care Screening Complete  Medication Review (RN CM) Complete  Some recent data might be hidden

## 2018-09-13 NOTE — Progress Notes (Signed)
Pt given prescriptions and discharge instructions. Instructions gone over with her and son present, both verbalized understanding. All questions answered. Walker taken to car by son. All belongings gathered to be sent home.

## 2018-09-13 NOTE — Procedures (Signed)
I was present at this dialysis session. I have reviewed the session itself and made appropriate changes.   Doing well.  For DC home today with HH PT.  2L UF, 2K bath.  BP stable.  NO issues at this time.  Next HD Friday at [facility].   Filed Weights   09/11/18 1036 09/13/18 0600 09/13/18 0709  Weight: 64.5 kg 64 kg 63.6 kg    Recent Labs  Lab 09/13/18 0438  NA 136  K 4.7  CL 99  CO2 25  GLUCOSE 88  BUN 30*  CREATININE 6.25*  CALCIUM 8.5*    Recent Labs  Lab 09/10/18 2006 09/11/18 0408 09/12/18 0302 09/13/18 0438  WBC 12.7* 11.6* 12.1* 9.6  NEUTROABS 9.6*  --   --   --   HGB 12.8 12.4 10.0* 8.6*  HCT 38.2 36.4 30.9* 26.6*  MCV 97.0 96.0 100.3* 100.0  PLT 167 154 131* 135*    Scheduled Meds: . aspirin  81 mg Oral BID WC  . calcitRIOL  0.25 mcg Oral 3 times weekly  . Chlorhexidine Gluconate Cloth  6 each Topical Q0600  . darbepoetin (ARANESP) injection - DIALYSIS  100 mcg Intravenous Q Wed-HD  . docusate sodium  100 mg Oral BID  . feeding supplement (PRO-STAT SUGAR FREE 64)  30 mL Oral BID  . nicotine  14 mg Transdermal Daily   Continuous Infusions: PRN Meds:.acetaminophen, HYDROcodone-acetaminophen, HYDROcodone-acetaminophen, HYDROmorphone (DILAUDID) injection, lidocaine-prilocaine, menthol-cetylpyridinium **OR** phenol, promethazine   Pearson Grippe  MD 09/13/2018, 8:46 AM

## 2018-09-13 NOTE — Plan of Care (Signed)
  Problem: Safety: Goal: Ability to remain free from injury will improve Outcome: Progressing   Problem: Elimination: Goal: Will not experience complications related to bowel motility Outcome: Progressing

## 2018-09-13 NOTE — Progress Notes (Signed)
Physical Therapy Treatment Patient Details Name: Kristin Gross MRN: [id number] DOB: [birth year] Today's Date: 09/13/2018    History of Present Illness Pt presents to hospital on 09/10/18 with L femoral fx following fall at home. Pt underwent L hip arthroplasty, anterior approach on 09/11/18. Pt with PMH significant for ESRD on dialysis (MWF), HTN, AAA.   PT Comments    Pt preparing for d/c this afternoon. Reviewed transfer and gait training with RW, pt moving well with min guard to supervision; declined stair training, but discussed safe technique. Son present throughout session. RW already delivered to room, adjusted to correct height. Reviewed precautions, positioning, therex and importance of mobility. If to remain admitted, will continue to follow acutely.     Follow Up Recommendations  Home health PT;Supervision for mobility/OOB     Equipment Recommendations  Rolling walker with 5" wheels(already delievered to room)    Recommendations for Other Services       Precautions / Restrictions Precautions Precautions: Fall Restrictions Weight Bearing Restrictions: Yes LLE Weight Bearing: Weight bearing as tolerated    Mobility  Bed Mobility               General bed mobility comments: Received sitting in recliner  Transfers Overall transfer level: Needs assistance Equipment used: Rolling walker (2 wheeled) Transfers: Sit to/from Stand Sit to Stand: Supervision         General transfer comment: Good hand placement without cues, supervision for safety; heavy reliance on BUE support  Ambulation/Gait Ambulation/Gait assistance: Supervision;Min guard Gait Distance (Feet): 50 Feet Assistive device: Rolling walker (2 wheeled) Gait Pattern/deviations: Step-through pattern;Decreased stride length;Antalgic;Trunk flexed Gait velocity: Decreased   General Gait Details: Slow, antalgic gait with RW and supervision to min guard for balance; pt limited by pain and fatigue; cues  for safety with RW. Son present for education   Stairs Stairs: (Pt declined; pt and son educated on safe technique with rail support and guarding)           Wheelchair Mobility    Modified Rankin (Stroke Patients Only)       Balance Overall balance assessment: Needs assistance   Sitting balance-Leahy Scale: Fair       Standing balance-Leahy Scale: Poor Standing balance comment: Reliant on UE support                            Cognition Arousal/Alertness: Awake/alert Behavior During Therapy: WFL for tasks assessed/performed Overall Cognitive Status: Within Functional Limits for tasks assessed                                        Exercises General Exercises - Lower Extremity Long Arc Quad: AROM;Both;Seated Hip Flexion/Marching: AROM;Both;Seated    General Comments        Pertinent Vitals/Pain Pain Assessment: Faces Faces Pain Scale: Hurts a little bit Pain Location: L hip Pain Descriptors / Indicators: Sore;Guarding Pain Intervention(s): Monitored during session    Home Living                      Prior Function            PT Goals (current goals can now be found in the care plan section) Acute Rehab PT Goals Patient Stated Goal: go home PT Goal Formulation: With patient Time For Goal Achievement: 09/26/18 Potential to Achieve Goals: Good  Progress towards PT goals: Progressing toward goals    Frequency    Min 5X/week      PT Plan Current plan remains appropriate    Co-evaluation              AM-PAC PT "6 Clicks" Mobility   Outcome Measure  Help needed turning from your back to your side while in a flat bed without using bedrails?: A Little Help needed moving from lying on your back to sitting on the side of a flat bed without using bedrails?: A Little Help needed moving to and from a bed to a chair (including a wheelchair)?: A Little Help needed standing up from a chair using your arms (e.g.,  wheelchair or bedside chair)?: A Little Help needed to walk in hospital room?: A Little Help needed climbing 3-5 steps with a railing? : A Little 6 Click Score: 18    End of Session   Activity Tolerance: Patient tolerated treatment well;Patient limited by fatigue Patient left: in chair;with call bell/phone within reach;with family/visitor present Nurse Communication: Mobility status PT Visit Diagnosis: Muscle weakness (generalized) (M62.81);Difficulty in walking, not elsewhere classified (R26.2)     Time: 4497-5300 PT Time Calculation (min) (ACUTE ONLY): 13 min  Charges:  $Gait Training: 8-22 mins                    Mabeline Caras, PT, DPT Acute Rehabilitation Services  Pager [phone] [place] 09/13/2018, 2:08 PM

## 2018-09-14 DIAGNOSIS — Z79891 Long term (current) use of opiate analgesic: Secondary | ICD-10-CM | POA: Diagnosis not present

## 2018-09-14 DIAGNOSIS — I120 Hypertensive chronic kidney disease with stage 5 chronic kidney disease or end stage renal disease: Secondary | ICD-10-CM | POA: Diagnosis not present

## 2018-09-14 DIAGNOSIS — Z992 Dependence on renal dialysis: Secondary | ICD-10-CM | POA: Diagnosis not present

## 2018-09-14 DIAGNOSIS — J449 Chronic obstructive pulmonary disease, unspecified: Secondary | ICD-10-CM | POA: Diagnosis not present

## 2018-09-14 DIAGNOSIS — W19XXXD Unspecified fall, subsequent encounter: Secondary | ICD-10-CM | POA: Diagnosis not present

## 2018-09-14 DIAGNOSIS — S72002D Fracture of unspecified part of neck of left femur, subsequent encounter for closed fracture with routine healing: Secondary | ICD-10-CM | POA: Diagnosis not present

## 2018-09-14 DIAGNOSIS — E785 Hyperlipidemia, unspecified: Secondary | ICD-10-CM | POA: Diagnosis not present

## 2018-09-14 DIAGNOSIS — E039 Hypothyroidism, unspecified: Secondary | ICD-10-CM | POA: Diagnosis not present

## 2018-09-14 DIAGNOSIS — Z7982 Long term (current) use of aspirin: Secondary | ICD-10-CM | POA: Diagnosis not present

## 2018-09-14 DIAGNOSIS — E211 Secondary hyperparathyroidism, not elsewhere classified: Secondary | ICD-10-CM | POA: Diagnosis not present

## 2018-09-14 DIAGNOSIS — N186 End stage renal disease: Secondary | ICD-10-CM | POA: Diagnosis not present

## 2018-09-14 DIAGNOSIS — I714 Abdominal aortic aneurysm, without rupture: Secondary | ICD-10-CM | POA: Diagnosis not present

## 2018-09-14 DIAGNOSIS — D631 Anemia in chronic kidney disease: Secondary | ICD-10-CM | POA: Diagnosis not present

## 2018-09-14 DIAGNOSIS — F17210 Nicotine dependence, cigarettes, uncomplicated: Secondary | ICD-10-CM | POA: Diagnosis not present

## 2018-09-14 NOTE — Evaluation (Signed)
Late ENTRY for 8/26  Occupational Therapy Evaluation Patient Details Name: Kristin Gross MRN: [id number] DOB: November 18, 1934 Today's Date: 09/14/2018    History of Present Illness Pt presents to hospital on 09/10/18 with L femoral fx following fall at home. Pt underwent L hip arthroplasty, anterior approach on 09/11/18. Pt with PMH significant for ESRD on dialysis (MWF), HTN, AAA.   Clinical Impression   OT eval and education complete.  Family will A as needed.      Follow Up Recommendations  Supervision/Assistance - 24 hour    Equipment Recommendations  None recommended by OT       Precautions / Restrictions Precautions Precautions: Fall Restrictions Weight Bearing Restrictions: Yes LLE Weight Bearing: Weight bearing as tolerated      Mobility Bed Mobility               General bed mobility comments: Received sitting in recliner  Transfers Overall transfer level: Needs assistance Equipment used: Rolling walker (2 wheeled) Transfers: Sit to/from Stand Sit to Stand: Supervision         General transfer comment: Good hand placement without cues, supervision for safety; heavy reliance on BUE support    Balance Overall balance assessment: Needs assistance   Sitting balance-Leahy Scale: Fair       Standing balance-Leahy Scale: Poor Standing balance comment: Reliant on UE support                           ADL either performed or assessed with clinical judgement   ADL Overall ADL's : Needs assistance/impaired Eating/Feeding: Set up;Sitting   Grooming: Set up;Sitting   Upper Body Bathing: Set up;Sitting   Lower Body Bathing: Set up;Sit to/from stand   Upper Body Dressing : Set up;Sitting   Lower Body Dressing: Minimal assistance;Sit to/from stand;With caregiver independent assisting   Toilet Transfer: Minimal assistance;BSC;Stand-pivot;Cueing for safety;Cueing for sequencing   Toileting- Clothing Manipulation and Hygiene: Minimal  assistance;Sit to/from stand         General ADL Comments: son present and will A pt at home as needed                  Pertinent Vitals/Pain Pain Assessment: Faces Faces Pain Scale: Hurts a little bit Pain Location: L hip Pain Descriptors / Indicators: Sore;Guarding     Hand Dominance     Extremity/Trunk Assessment Upper Extremity Assessment Upper Extremity Assessment: Generalized weakness           Communication Communication Communication: No difficulties   Cognition Arousal/Alertness: Awake/alert Behavior During Therapy: WFL for tasks assessed/performed Overall Cognitive Status: Within Functional Limits for tasks assessed                                                Home Living Family/patient expects to be discharged to:: Private residence Living Arrangements: Spouse/significant other Available Help at Discharge: Available 24 hours/day;Family(daughter plans to take a couple weeks off work to stay with pt) Type of Home: House Home Access: Stairs to enter Technical brewer of Steps: 3 Entrance Stairs-Rails: Right Home Layout: Multi-level;1/2 bath on main level(family has made bedroom on main level so pt can stay down there)               Home Equipment: Walker - 2 wheels   Additional Comments: Pt assists husband with  cooking & other ADLs.      Prior Functioning/Environment Level of Independence: Independent        Comments: cares for husband (no physical assist though)                 OT Goals(Current goals can be found in the care plan section) Acute Rehab OT Goals Patient Stated Goal: go home  OT Frequency:      AM-PAC OT "6 Clicks" Daily Activity     Outcome Measure Help from another person eating meals?: None Help from another person taking care of personal grooming?: None Help from another person toileting, which includes using toliet, bedpan, or urinal?: A Little Help from another person bathing  (including washing, rinsing, drying)?: A Little Help from another person to put on and taking off regular upper body clothing?: A Little Help from another person to put on and taking off regular lower body clothing?: A Little 6 Click Score: 20   End of Session Equipment Utilized During Treatment: Gait belt;Rolling walker Nurse Communication: Mobility status  Activity Tolerance: Patient tolerated treatment well Patient left: in chair;with call bell/phone within reach;with family/visitor present      Charges:             1 visit 1 eval  Kari Baars, [place] [phone] Office- [phone]     Travas Schexnayder, Edwena Felty D 09/14/2018, 10:50 AM

## 2018-09-18 DIAGNOSIS — I129 Hypertensive chronic kidney disease with stage 1 through stage 4 chronic kidney disease, or unspecified chronic kidney disease: Secondary | ICD-10-CM | POA: Diagnosis not present

## 2018-09-18 DIAGNOSIS — D509 Iron deficiency anemia, unspecified: Secondary | ICD-10-CM | POA: Diagnosis not present

## 2018-09-18 DIAGNOSIS — D689 Coagulation defect, unspecified: Secondary | ICD-10-CM | POA: Diagnosis not present

## 2018-09-18 DIAGNOSIS — N2581 Secondary hyperparathyroidism of renal origin: Secondary | ICD-10-CM | POA: Diagnosis not present

## 2018-09-18 DIAGNOSIS — N186 End stage renal disease: Secondary | ICD-10-CM | POA: Diagnosis not present

## 2018-09-18 DIAGNOSIS — Z992 Dependence on renal dialysis: Secondary | ICD-10-CM | POA: Diagnosis not present

## 2018-09-20 DIAGNOSIS — N2581 Secondary hyperparathyroidism of renal origin: Secondary | ICD-10-CM | POA: Diagnosis not present

## 2018-09-20 DIAGNOSIS — D631 Anemia in chronic kidney disease: Secondary | ICD-10-CM | POA: Diagnosis not present

## 2018-09-20 DIAGNOSIS — Z23 Encounter for immunization: Secondary | ICD-10-CM | POA: Diagnosis not present

## 2018-09-20 DIAGNOSIS — D689 Coagulation defect, unspecified: Secondary | ICD-10-CM | POA: Diagnosis not present

## 2018-09-20 DIAGNOSIS — N186 End stage renal disease: Secondary | ICD-10-CM | POA: Diagnosis not present

## 2018-09-20 DIAGNOSIS — Z992 Dependence on renal dialysis: Secondary | ICD-10-CM | POA: Diagnosis not present

## 2018-09-20 DIAGNOSIS — R51 Headache: Secondary | ICD-10-CM | POA: Diagnosis not present

## 2018-09-20 DIAGNOSIS — D509 Iron deficiency anemia, unspecified: Secondary | ICD-10-CM | POA: Diagnosis not present

## 2018-09-22 DIAGNOSIS — D631 Anemia in chronic kidney disease: Secondary | ICD-10-CM | POA: Diagnosis not present

## 2018-09-22 DIAGNOSIS — Z992 Dependence on renal dialysis: Secondary | ICD-10-CM | POA: Diagnosis not present

## 2018-09-22 DIAGNOSIS — R51 Headache: Secondary | ICD-10-CM | POA: Diagnosis not present

## 2018-09-22 DIAGNOSIS — N186 End stage renal disease: Secondary | ICD-10-CM | POA: Diagnosis not present

## 2018-09-22 DIAGNOSIS — D509 Iron deficiency anemia, unspecified: Secondary | ICD-10-CM | POA: Diagnosis not present

## 2018-09-22 DIAGNOSIS — D689 Coagulation defect, unspecified: Secondary | ICD-10-CM | POA: Diagnosis not present

## 2018-09-22 DIAGNOSIS — Z23 Encounter for immunization: Secondary | ICD-10-CM | POA: Diagnosis not present

## 2018-09-22 DIAGNOSIS — N2581 Secondary hyperparathyroidism of renal origin: Secondary | ICD-10-CM | POA: Diagnosis not present

## 2018-09-25 DIAGNOSIS — Z23 Encounter for immunization: Secondary | ICD-10-CM | POA: Diagnosis not present

## 2018-09-25 DIAGNOSIS — N2581 Secondary hyperparathyroidism of renal origin: Secondary | ICD-10-CM | POA: Diagnosis not present

## 2018-09-25 DIAGNOSIS — D631 Anemia in chronic kidney disease: Secondary | ICD-10-CM | POA: Diagnosis not present

## 2018-09-25 DIAGNOSIS — N186 End stage renal disease: Secondary | ICD-10-CM | POA: Diagnosis not present

## 2018-09-25 DIAGNOSIS — R51 Headache: Secondary | ICD-10-CM | POA: Diagnosis not present

## 2018-09-25 DIAGNOSIS — D689 Coagulation defect, unspecified: Secondary | ICD-10-CM | POA: Diagnosis not present

## 2018-09-25 DIAGNOSIS — Z992 Dependence on renal dialysis: Secondary | ICD-10-CM | POA: Diagnosis not present

## 2018-09-25 DIAGNOSIS — D509 Iron deficiency anemia, unspecified: Secondary | ICD-10-CM | POA: Diagnosis not present

## 2018-09-26 DIAGNOSIS — S72032D Displaced midcervical fracture of left femur, subsequent encounter for closed fracture with routine healing: Secondary | ICD-10-CM | POA: Diagnosis not present

## 2018-09-27 DIAGNOSIS — Z992 Dependence on renal dialysis: Secondary | ICD-10-CM | POA: Diagnosis not present

## 2018-09-27 DIAGNOSIS — D689 Coagulation defect, unspecified: Secondary | ICD-10-CM | POA: Diagnosis not present

## 2018-09-27 DIAGNOSIS — Z23 Encounter for immunization: Secondary | ICD-10-CM | POA: Diagnosis not present

## 2018-09-27 DIAGNOSIS — D509 Iron deficiency anemia, unspecified: Secondary | ICD-10-CM | POA: Diagnosis not present

## 2018-09-27 DIAGNOSIS — N2581 Secondary hyperparathyroidism of renal origin: Secondary | ICD-10-CM | POA: Diagnosis not present

## 2018-09-27 DIAGNOSIS — D631 Anemia in chronic kidney disease: Secondary | ICD-10-CM | POA: Diagnosis not present

## 2018-09-27 DIAGNOSIS — R51 Headache: Secondary | ICD-10-CM | POA: Diagnosis not present

## 2018-09-27 DIAGNOSIS — N186 End stage renal disease: Secondary | ICD-10-CM | POA: Diagnosis not present

## 2018-09-29 DIAGNOSIS — N2581 Secondary hyperparathyroidism of renal origin: Secondary | ICD-10-CM | POA: Diagnosis not present

## 2018-09-29 DIAGNOSIS — D631 Anemia in chronic kidney disease: Secondary | ICD-10-CM | POA: Diagnosis not present

## 2018-09-29 DIAGNOSIS — D509 Iron deficiency anemia, unspecified: Secondary | ICD-10-CM | POA: Diagnosis not present

## 2018-09-29 DIAGNOSIS — D689 Coagulation defect, unspecified: Secondary | ICD-10-CM | POA: Diagnosis not present

## 2018-09-29 DIAGNOSIS — R51 Headache: Secondary | ICD-10-CM | POA: Diagnosis not present

## 2018-09-29 DIAGNOSIS — Z992 Dependence on renal dialysis: Secondary | ICD-10-CM | POA: Diagnosis not present

## 2018-09-29 DIAGNOSIS — N186 End stage renal disease: Secondary | ICD-10-CM | POA: Diagnosis not present

## 2018-09-29 DIAGNOSIS — Z23 Encounter for immunization: Secondary | ICD-10-CM | POA: Diagnosis not present

## 2018-10-02 DIAGNOSIS — D509 Iron deficiency anemia, unspecified: Secondary | ICD-10-CM | POA: Diagnosis not present

## 2018-10-02 DIAGNOSIS — D689 Coagulation defect, unspecified: Secondary | ICD-10-CM | POA: Diagnosis not present

## 2018-10-02 DIAGNOSIS — D631 Anemia in chronic kidney disease: Secondary | ICD-10-CM | POA: Diagnosis not present

## 2018-10-02 DIAGNOSIS — Z992 Dependence on renal dialysis: Secondary | ICD-10-CM | POA: Diagnosis not present

## 2018-10-02 DIAGNOSIS — N2581 Secondary hyperparathyroidism of renal origin: Secondary | ICD-10-CM | POA: Diagnosis not present

## 2018-10-02 DIAGNOSIS — Z23 Encounter for immunization: Secondary | ICD-10-CM | POA: Diagnosis not present

## 2018-10-02 DIAGNOSIS — N186 End stage renal disease: Secondary | ICD-10-CM | POA: Diagnosis not present

## 2018-10-02 DIAGNOSIS — R51 Headache: Secondary | ICD-10-CM | POA: Diagnosis not present

## 2018-10-04 DIAGNOSIS — Z23 Encounter for immunization: Secondary | ICD-10-CM | POA: Diagnosis not present

## 2018-10-04 DIAGNOSIS — N186 End stage renal disease: Secondary | ICD-10-CM | POA: Diagnosis not present

## 2018-10-04 DIAGNOSIS — D631 Anemia in chronic kidney disease: Secondary | ICD-10-CM | POA: Diagnosis not present

## 2018-10-04 DIAGNOSIS — D509 Iron deficiency anemia, unspecified: Secondary | ICD-10-CM | POA: Diagnosis not present

## 2018-10-04 DIAGNOSIS — R51 Headache: Secondary | ICD-10-CM | POA: Diagnosis not present

## 2018-10-04 DIAGNOSIS — Z992 Dependence on renal dialysis: Secondary | ICD-10-CM | POA: Diagnosis not present

## 2018-10-04 DIAGNOSIS — N2581 Secondary hyperparathyroidism of renal origin: Secondary | ICD-10-CM | POA: Diagnosis not present

## 2018-10-04 DIAGNOSIS — D689 Coagulation defect, unspecified: Secondary | ICD-10-CM | POA: Diagnosis not present

## 2018-10-06 DIAGNOSIS — R51 Headache: Secondary | ICD-10-CM | POA: Diagnosis not present

## 2018-10-06 DIAGNOSIS — Z992 Dependence on renal dialysis: Secondary | ICD-10-CM | POA: Diagnosis not present

## 2018-10-06 DIAGNOSIS — D631 Anemia in chronic kidney disease: Secondary | ICD-10-CM | POA: Diagnosis not present

## 2018-10-06 DIAGNOSIS — N186 End stage renal disease: Secondary | ICD-10-CM | POA: Diagnosis not present

## 2018-10-06 DIAGNOSIS — N2581 Secondary hyperparathyroidism of renal origin: Secondary | ICD-10-CM | POA: Diagnosis not present

## 2018-10-06 DIAGNOSIS — Z23 Encounter for immunization: Secondary | ICD-10-CM | POA: Diagnosis not present

## 2018-10-06 DIAGNOSIS — D689 Coagulation defect, unspecified: Secondary | ICD-10-CM | POA: Diagnosis not present

## 2018-10-06 DIAGNOSIS — D509 Iron deficiency anemia, unspecified: Secondary | ICD-10-CM | POA: Diagnosis not present

## 2018-10-09 DIAGNOSIS — N186 End stage renal disease: Secondary | ICD-10-CM | POA: Diagnosis not present

## 2018-10-09 DIAGNOSIS — Z992 Dependence on renal dialysis: Secondary | ICD-10-CM | POA: Diagnosis not present

## 2018-10-09 DIAGNOSIS — D509 Iron deficiency anemia, unspecified: Secondary | ICD-10-CM | POA: Diagnosis not present

## 2018-10-09 DIAGNOSIS — Z23 Encounter for immunization: Secondary | ICD-10-CM | POA: Diagnosis not present

## 2018-10-09 DIAGNOSIS — R51 Headache: Secondary | ICD-10-CM | POA: Diagnosis not present

## 2018-10-09 DIAGNOSIS — D689 Coagulation defect, unspecified: Secondary | ICD-10-CM | POA: Diagnosis not present

## 2018-10-09 DIAGNOSIS — D631 Anemia in chronic kidney disease: Secondary | ICD-10-CM | POA: Diagnosis not present

## 2018-10-09 DIAGNOSIS — N2581 Secondary hyperparathyroidism of renal origin: Secondary | ICD-10-CM | POA: Diagnosis not present

## 2018-10-11 DIAGNOSIS — R51 Headache: Secondary | ICD-10-CM | POA: Diagnosis not present

## 2018-10-11 DIAGNOSIS — D689 Coagulation defect, unspecified: Secondary | ICD-10-CM | POA: Diagnosis not present

## 2018-10-11 DIAGNOSIS — N186 End stage renal disease: Secondary | ICD-10-CM | POA: Diagnosis not present

## 2018-10-11 DIAGNOSIS — Z23 Encounter for immunization: Secondary | ICD-10-CM | POA: Diagnosis not present

## 2018-10-11 DIAGNOSIS — Z992 Dependence on renal dialysis: Secondary | ICD-10-CM | POA: Diagnosis not present

## 2018-10-11 DIAGNOSIS — N2581 Secondary hyperparathyroidism of renal origin: Secondary | ICD-10-CM | POA: Diagnosis not present

## 2018-10-11 DIAGNOSIS — D509 Iron deficiency anemia, unspecified: Secondary | ICD-10-CM | POA: Diagnosis not present

## 2018-10-11 DIAGNOSIS — D631 Anemia in chronic kidney disease: Secondary | ICD-10-CM | POA: Diagnosis not present

## 2018-10-13 DIAGNOSIS — Z992 Dependence on renal dialysis: Secondary | ICD-10-CM | POA: Diagnosis not present

## 2018-10-13 DIAGNOSIS — D509 Iron deficiency anemia, unspecified: Secondary | ICD-10-CM | POA: Diagnosis not present

## 2018-10-13 DIAGNOSIS — N186 End stage renal disease: Secondary | ICD-10-CM | POA: Diagnosis not present

## 2018-10-13 DIAGNOSIS — D631 Anemia in chronic kidney disease: Secondary | ICD-10-CM | POA: Diagnosis not present

## 2018-10-13 DIAGNOSIS — D689 Coagulation defect, unspecified: Secondary | ICD-10-CM | POA: Diagnosis not present

## 2018-10-13 DIAGNOSIS — Z23 Encounter for immunization: Secondary | ICD-10-CM | POA: Diagnosis not present

## 2018-10-13 DIAGNOSIS — N2581 Secondary hyperparathyroidism of renal origin: Secondary | ICD-10-CM | POA: Diagnosis not present

## 2018-10-13 DIAGNOSIS — R51 Headache: Secondary | ICD-10-CM | POA: Diagnosis not present

## 2018-10-16 DIAGNOSIS — D689 Coagulation defect, unspecified: Secondary | ICD-10-CM | POA: Diagnosis not present

## 2018-10-16 DIAGNOSIS — Z992 Dependence on renal dialysis: Secondary | ICD-10-CM | POA: Diagnosis not present

## 2018-10-16 DIAGNOSIS — D631 Anemia in chronic kidney disease: Secondary | ICD-10-CM | POA: Diagnosis not present

## 2018-10-16 DIAGNOSIS — N186 End stage renal disease: Secondary | ICD-10-CM | POA: Diagnosis not present

## 2018-10-16 DIAGNOSIS — D509 Iron deficiency anemia, unspecified: Secondary | ICD-10-CM | POA: Diagnosis not present

## 2018-10-16 DIAGNOSIS — R51 Headache: Secondary | ICD-10-CM | POA: Diagnosis not present

## 2018-10-16 DIAGNOSIS — N2581 Secondary hyperparathyroidism of renal origin: Secondary | ICD-10-CM | POA: Diagnosis not present

## 2018-10-16 DIAGNOSIS — Z23 Encounter for immunization: Secondary | ICD-10-CM | POA: Diagnosis not present

## 2018-10-17 DIAGNOSIS — N186 End stage renal disease: Secondary | ICD-10-CM | POA: Diagnosis not present

## 2018-10-17 DIAGNOSIS — I120 Hypertensive chronic kidney disease with stage 5 chronic kidney disease or end stage renal disease: Secondary | ICD-10-CM | POA: Diagnosis not present

## 2018-10-17 DIAGNOSIS — F17210 Nicotine dependence, cigarettes, uncomplicated: Secondary | ICD-10-CM | POA: Diagnosis not present

## 2018-10-17 DIAGNOSIS — E211 Secondary hyperparathyroidism, not elsewhere classified: Secondary | ICD-10-CM | POA: Diagnosis not present

## 2018-10-17 DIAGNOSIS — Z79891 Long term (current) use of opiate analgesic: Secondary | ICD-10-CM | POA: Diagnosis not present

## 2018-10-17 DIAGNOSIS — S72002D Fracture of unspecified part of neck of left femur, subsequent encounter for closed fracture with routine healing: Secondary | ICD-10-CM | POA: Diagnosis not present

## 2018-10-17 DIAGNOSIS — I714 Abdominal aortic aneurysm, without rupture: Secondary | ICD-10-CM | POA: Diagnosis not present

## 2018-10-17 DIAGNOSIS — D631 Anemia in chronic kidney disease: Secondary | ICD-10-CM | POA: Diagnosis not present

## 2018-10-17 DIAGNOSIS — J449 Chronic obstructive pulmonary disease, unspecified: Secondary | ICD-10-CM | POA: Diagnosis not present

## 2018-10-17 DIAGNOSIS — E785 Hyperlipidemia, unspecified: Secondary | ICD-10-CM | POA: Diagnosis not present

## 2018-10-17 DIAGNOSIS — E039 Hypothyroidism, unspecified: Secondary | ICD-10-CM | POA: Diagnosis not present

## 2018-10-17 DIAGNOSIS — W19XXXD Unspecified fall, subsequent encounter: Secondary | ICD-10-CM | POA: Diagnosis not present

## 2018-10-17 DIAGNOSIS — Z992 Dependence on renal dialysis: Secondary | ICD-10-CM | POA: Diagnosis not present

## 2018-10-17 DIAGNOSIS — Z7982 Long term (current) use of aspirin: Secondary | ICD-10-CM | POA: Diagnosis not present

## 2018-10-18 DIAGNOSIS — I129 Hypertensive chronic kidney disease with stage 1 through stage 4 chronic kidney disease, or unspecified chronic kidney disease: Secondary | ICD-10-CM | POA: Diagnosis not present

## 2018-10-18 DIAGNOSIS — Z23 Encounter for immunization: Secondary | ICD-10-CM | POA: Diagnosis not present

## 2018-10-18 DIAGNOSIS — D509 Iron deficiency anemia, unspecified: Secondary | ICD-10-CM | POA: Diagnosis not present

## 2018-10-18 DIAGNOSIS — Z992 Dependence on renal dialysis: Secondary | ICD-10-CM | POA: Diagnosis not present

## 2018-10-18 DIAGNOSIS — N186 End stage renal disease: Secondary | ICD-10-CM | POA: Diagnosis not present

## 2018-10-18 DIAGNOSIS — R51 Headache: Secondary | ICD-10-CM | POA: Diagnosis not present

## 2018-10-18 DIAGNOSIS — D631 Anemia in chronic kidney disease: Secondary | ICD-10-CM | POA: Diagnosis not present

## 2018-10-18 DIAGNOSIS — N2581 Secondary hyperparathyroidism of renal origin: Secondary | ICD-10-CM | POA: Diagnosis not present

## 2018-10-18 DIAGNOSIS — D689 Coagulation defect, unspecified: Secondary | ICD-10-CM | POA: Diagnosis not present

## 2018-10-20 DIAGNOSIS — N186 End stage renal disease: Secondary | ICD-10-CM | POA: Diagnosis not present

## 2018-10-20 DIAGNOSIS — R52 Pain, unspecified: Secondary | ICD-10-CM | POA: Diagnosis not present

## 2018-10-20 DIAGNOSIS — Z992 Dependence on renal dialysis: Secondary | ICD-10-CM | POA: Diagnosis not present

## 2018-10-20 DIAGNOSIS — D509 Iron deficiency anemia, unspecified: Secondary | ICD-10-CM | POA: Diagnosis not present

## 2018-10-20 DIAGNOSIS — D689 Coagulation defect, unspecified: Secondary | ICD-10-CM | POA: Diagnosis not present

## 2018-10-20 DIAGNOSIS — N2581 Secondary hyperparathyroidism of renal origin: Secondary | ICD-10-CM | POA: Diagnosis not present

## 2018-10-23 DIAGNOSIS — N186 End stage renal disease: Secondary | ICD-10-CM | POA: Diagnosis not present

## 2018-10-23 DIAGNOSIS — D509 Iron deficiency anemia, unspecified: Secondary | ICD-10-CM | POA: Diagnosis not present

## 2018-10-23 DIAGNOSIS — N2581 Secondary hyperparathyroidism of renal origin: Secondary | ICD-10-CM | POA: Diagnosis not present

## 2018-10-23 DIAGNOSIS — Z992 Dependence on renal dialysis: Secondary | ICD-10-CM | POA: Diagnosis not present

## 2018-10-23 DIAGNOSIS — R52 Pain, unspecified: Secondary | ICD-10-CM | POA: Diagnosis not present

## 2018-10-23 DIAGNOSIS — D689 Coagulation defect, unspecified: Secondary | ICD-10-CM | POA: Diagnosis not present

## 2018-10-24 DIAGNOSIS — S72032D Displaced midcervical fracture of left femur, subsequent encounter for closed fracture with routine healing: Secondary | ICD-10-CM | POA: Diagnosis not present

## 2018-10-25 DIAGNOSIS — R52 Pain, unspecified: Secondary | ICD-10-CM | POA: Diagnosis not present

## 2018-10-25 DIAGNOSIS — Z992 Dependence on renal dialysis: Secondary | ICD-10-CM | POA: Diagnosis not present

## 2018-10-25 DIAGNOSIS — N186 End stage renal disease: Secondary | ICD-10-CM | POA: Diagnosis not present

## 2018-10-25 DIAGNOSIS — N2581 Secondary hyperparathyroidism of renal origin: Secondary | ICD-10-CM | POA: Diagnosis not present

## 2018-10-25 DIAGNOSIS — D509 Iron deficiency anemia, unspecified: Secondary | ICD-10-CM | POA: Diagnosis not present

## 2018-10-25 DIAGNOSIS — D689 Coagulation defect, unspecified: Secondary | ICD-10-CM | POA: Diagnosis not present

## 2018-10-27 DIAGNOSIS — N186 End stage renal disease: Secondary | ICD-10-CM | POA: Diagnosis not present

## 2018-10-27 DIAGNOSIS — D509 Iron deficiency anemia, unspecified: Secondary | ICD-10-CM | POA: Diagnosis not present

## 2018-10-27 DIAGNOSIS — D689 Coagulation defect, unspecified: Secondary | ICD-10-CM | POA: Diagnosis not present

## 2018-10-27 DIAGNOSIS — Z992 Dependence on renal dialysis: Secondary | ICD-10-CM | POA: Diagnosis not present

## 2018-10-27 DIAGNOSIS — R52 Pain, unspecified: Secondary | ICD-10-CM | POA: Diagnosis not present

## 2018-10-27 DIAGNOSIS — N2581 Secondary hyperparathyroidism of renal origin: Secondary | ICD-10-CM | POA: Diagnosis not present

## 2018-10-30 DIAGNOSIS — D689 Coagulation defect, unspecified: Secondary | ICD-10-CM | POA: Diagnosis not present

## 2018-10-30 DIAGNOSIS — R52 Pain, unspecified: Secondary | ICD-10-CM | POA: Diagnosis not present

## 2018-10-30 DIAGNOSIS — N186 End stage renal disease: Secondary | ICD-10-CM | POA: Diagnosis not present

## 2018-10-30 DIAGNOSIS — Z992 Dependence on renal dialysis: Secondary | ICD-10-CM | POA: Diagnosis not present

## 2018-10-30 DIAGNOSIS — N2581 Secondary hyperparathyroidism of renal origin: Secondary | ICD-10-CM | POA: Diagnosis not present

## 2018-10-30 DIAGNOSIS — D509 Iron deficiency anemia, unspecified: Secondary | ICD-10-CM | POA: Diagnosis not present

## 2018-11-01 DIAGNOSIS — Z992 Dependence on renal dialysis: Secondary | ICD-10-CM | POA: Diagnosis not present

## 2018-11-01 DIAGNOSIS — D689 Coagulation defect, unspecified: Secondary | ICD-10-CM | POA: Diagnosis not present

## 2018-11-01 DIAGNOSIS — D509 Iron deficiency anemia, unspecified: Secondary | ICD-10-CM | POA: Diagnosis not present

## 2018-11-01 DIAGNOSIS — N186 End stage renal disease: Secondary | ICD-10-CM | POA: Diagnosis not present

## 2018-11-01 DIAGNOSIS — N2581 Secondary hyperparathyroidism of renal origin: Secondary | ICD-10-CM | POA: Diagnosis not present

## 2018-11-01 DIAGNOSIS — R52 Pain, unspecified: Secondary | ICD-10-CM | POA: Diagnosis not present

## 2018-11-03 DIAGNOSIS — D689 Coagulation defect, unspecified: Secondary | ICD-10-CM | POA: Diagnosis not present

## 2018-11-03 DIAGNOSIS — Z992 Dependence on renal dialysis: Secondary | ICD-10-CM | POA: Diagnosis not present

## 2018-11-03 DIAGNOSIS — D509 Iron deficiency anemia, unspecified: Secondary | ICD-10-CM | POA: Diagnosis not present

## 2018-11-03 DIAGNOSIS — R52 Pain, unspecified: Secondary | ICD-10-CM | POA: Diagnosis not present

## 2018-11-03 DIAGNOSIS — N186 End stage renal disease: Secondary | ICD-10-CM | POA: Diagnosis not present

## 2018-11-03 DIAGNOSIS — N2581 Secondary hyperparathyroidism of renal origin: Secondary | ICD-10-CM | POA: Diagnosis not present

## 2018-11-06 DIAGNOSIS — Z992 Dependence on renal dialysis: Secondary | ICD-10-CM | POA: Diagnosis not present

## 2018-11-06 DIAGNOSIS — N186 End stage renal disease: Secondary | ICD-10-CM | POA: Diagnosis not present

## 2018-11-06 DIAGNOSIS — R52 Pain, unspecified: Secondary | ICD-10-CM | POA: Diagnosis not present

## 2018-11-06 DIAGNOSIS — D689 Coagulation defect, unspecified: Secondary | ICD-10-CM | POA: Diagnosis not present

## 2018-11-06 DIAGNOSIS — D509 Iron deficiency anemia, unspecified: Secondary | ICD-10-CM | POA: Diagnosis not present

## 2018-11-06 DIAGNOSIS — N2581 Secondary hyperparathyroidism of renal origin: Secondary | ICD-10-CM | POA: Diagnosis not present

## 2018-11-08 DIAGNOSIS — N2581 Secondary hyperparathyroidism of renal origin: Secondary | ICD-10-CM | POA: Diagnosis not present

## 2018-11-08 DIAGNOSIS — R52 Pain, unspecified: Secondary | ICD-10-CM | POA: Diagnosis not present

## 2018-11-08 DIAGNOSIS — D509 Iron deficiency anemia, unspecified: Secondary | ICD-10-CM | POA: Diagnosis not present

## 2018-11-08 DIAGNOSIS — D689 Coagulation defect, unspecified: Secondary | ICD-10-CM | POA: Diagnosis not present

## 2018-11-08 DIAGNOSIS — N186 End stage renal disease: Secondary | ICD-10-CM | POA: Diagnosis not present

## 2018-11-08 DIAGNOSIS — Z992 Dependence on renal dialysis: Secondary | ICD-10-CM | POA: Diagnosis not present

## 2018-11-10 DIAGNOSIS — N186 End stage renal disease: Secondary | ICD-10-CM | POA: Diagnosis not present

## 2018-11-10 DIAGNOSIS — D689 Coagulation defect, unspecified: Secondary | ICD-10-CM | POA: Diagnosis not present

## 2018-11-10 DIAGNOSIS — R52 Pain, unspecified: Secondary | ICD-10-CM | POA: Diagnosis not present

## 2018-11-10 DIAGNOSIS — N2581 Secondary hyperparathyroidism of renal origin: Secondary | ICD-10-CM | POA: Diagnosis not present

## 2018-11-10 DIAGNOSIS — Z992 Dependence on renal dialysis: Secondary | ICD-10-CM | POA: Diagnosis not present

## 2018-11-10 DIAGNOSIS — D509 Iron deficiency anemia, unspecified: Secondary | ICD-10-CM | POA: Diagnosis not present

## 2018-11-13 DIAGNOSIS — N186 End stage renal disease: Secondary | ICD-10-CM | POA: Diagnosis not present

## 2018-11-13 DIAGNOSIS — D509 Iron deficiency anemia, unspecified: Secondary | ICD-10-CM | POA: Diagnosis not present

## 2018-11-13 DIAGNOSIS — D689 Coagulation defect, unspecified: Secondary | ICD-10-CM | POA: Diagnosis not present

## 2018-11-13 DIAGNOSIS — Z992 Dependence on renal dialysis: Secondary | ICD-10-CM | POA: Diagnosis not present

## 2018-11-13 DIAGNOSIS — R52 Pain, unspecified: Secondary | ICD-10-CM | POA: Diagnosis not present

## 2018-11-13 DIAGNOSIS — N2581 Secondary hyperparathyroidism of renal origin: Secondary | ICD-10-CM | POA: Diagnosis not present

## 2018-11-15 DIAGNOSIS — N2581 Secondary hyperparathyroidism of renal origin: Secondary | ICD-10-CM | POA: Diagnosis not present

## 2018-11-15 DIAGNOSIS — D509 Iron deficiency anemia, unspecified: Secondary | ICD-10-CM | POA: Diagnosis not present

## 2018-11-15 DIAGNOSIS — N186 End stage renal disease: Secondary | ICD-10-CM | POA: Diagnosis not present

## 2018-11-15 DIAGNOSIS — Z992 Dependence on renal dialysis: Secondary | ICD-10-CM | POA: Diagnosis not present

## 2018-11-15 DIAGNOSIS — R52 Pain, unspecified: Secondary | ICD-10-CM | POA: Diagnosis not present

## 2018-11-15 DIAGNOSIS — D689 Coagulation defect, unspecified: Secondary | ICD-10-CM | POA: Diagnosis not present

## 2018-11-17 DIAGNOSIS — D689 Coagulation defect, unspecified: Secondary | ICD-10-CM | POA: Diagnosis not present

## 2018-11-17 DIAGNOSIS — N186 End stage renal disease: Secondary | ICD-10-CM | POA: Diagnosis not present

## 2018-11-17 DIAGNOSIS — Z992 Dependence on renal dialysis: Secondary | ICD-10-CM | POA: Diagnosis not present

## 2018-11-17 DIAGNOSIS — R52 Pain, unspecified: Secondary | ICD-10-CM | POA: Diagnosis not present

## 2018-11-17 DIAGNOSIS — N2581 Secondary hyperparathyroidism of renal origin: Secondary | ICD-10-CM | POA: Diagnosis not present

## 2018-11-17 DIAGNOSIS — D509 Iron deficiency anemia, unspecified: Secondary | ICD-10-CM | POA: Diagnosis not present

## 2018-11-18 DIAGNOSIS — Z992 Dependence on renal dialysis: Secondary | ICD-10-CM | POA: Diagnosis not present

## 2018-11-18 DIAGNOSIS — I129 Hypertensive chronic kidney disease with stage 1 through stage 4 chronic kidney disease, or unspecified chronic kidney disease: Secondary | ICD-10-CM | POA: Diagnosis not present

## 2018-11-18 DIAGNOSIS — N186 End stage renal disease: Secondary | ICD-10-CM | POA: Diagnosis not present

## 2018-11-20 DIAGNOSIS — Z992 Dependence on renal dialysis: Secondary | ICD-10-CM | POA: Diagnosis not present

## 2018-11-20 DIAGNOSIS — N186 End stage renal disease: Secondary | ICD-10-CM | POA: Diagnosis not present

## 2018-11-20 DIAGNOSIS — D689 Coagulation defect, unspecified: Secondary | ICD-10-CM | POA: Diagnosis not present

## 2018-11-20 DIAGNOSIS — D509 Iron deficiency anemia, unspecified: Secondary | ICD-10-CM | POA: Diagnosis not present

## 2018-11-20 DIAGNOSIS — N2581 Secondary hyperparathyroidism of renal origin: Secondary | ICD-10-CM | POA: Diagnosis not present

## 2018-11-22 DIAGNOSIS — N186 End stage renal disease: Secondary | ICD-10-CM | POA: Diagnosis not present

## 2018-11-22 DIAGNOSIS — D509 Iron deficiency anemia, unspecified: Secondary | ICD-10-CM | POA: Diagnosis not present

## 2018-11-22 DIAGNOSIS — D689 Coagulation defect, unspecified: Secondary | ICD-10-CM | POA: Diagnosis not present

## 2018-11-22 DIAGNOSIS — N2581 Secondary hyperparathyroidism of renal origin: Secondary | ICD-10-CM | POA: Diagnosis not present

## 2018-11-22 DIAGNOSIS — Z992 Dependence on renal dialysis: Secondary | ICD-10-CM | POA: Diagnosis not present

## 2018-11-24 DIAGNOSIS — N186 End stage renal disease: Secondary | ICD-10-CM | POA: Diagnosis not present

## 2018-11-24 DIAGNOSIS — D509 Iron deficiency anemia, unspecified: Secondary | ICD-10-CM | POA: Diagnosis not present

## 2018-11-24 DIAGNOSIS — N2581 Secondary hyperparathyroidism of renal origin: Secondary | ICD-10-CM | POA: Diagnosis not present

## 2018-11-24 DIAGNOSIS — Z992 Dependence on renal dialysis: Secondary | ICD-10-CM | POA: Diagnosis not present

## 2018-11-24 DIAGNOSIS — D689 Coagulation defect, unspecified: Secondary | ICD-10-CM | POA: Diagnosis not present

## 2018-11-27 DIAGNOSIS — D689 Coagulation defect, unspecified: Secondary | ICD-10-CM | POA: Diagnosis not present

## 2018-11-27 DIAGNOSIS — N186 End stage renal disease: Secondary | ICD-10-CM | POA: Diagnosis not present

## 2018-11-27 DIAGNOSIS — Z992 Dependence on renal dialysis: Secondary | ICD-10-CM | POA: Diagnosis not present

## 2018-11-27 DIAGNOSIS — D509 Iron deficiency anemia, unspecified: Secondary | ICD-10-CM | POA: Diagnosis not present

## 2018-11-27 DIAGNOSIS — N2581 Secondary hyperparathyroidism of renal origin: Secondary | ICD-10-CM | POA: Diagnosis not present

## 2018-11-29 DIAGNOSIS — D509 Iron deficiency anemia, unspecified: Secondary | ICD-10-CM | POA: Diagnosis not present

## 2018-11-29 DIAGNOSIS — D689 Coagulation defect, unspecified: Secondary | ICD-10-CM | POA: Diagnosis not present

## 2018-11-29 DIAGNOSIS — N2581 Secondary hyperparathyroidism of renal origin: Secondary | ICD-10-CM | POA: Diagnosis not present

## 2018-11-29 DIAGNOSIS — Z992 Dependence on renal dialysis: Secondary | ICD-10-CM | POA: Diagnosis not present

## 2018-11-29 DIAGNOSIS — N186 End stage renal disease: Secondary | ICD-10-CM | POA: Diagnosis not present

## 2018-12-01 DIAGNOSIS — N2581 Secondary hyperparathyroidism of renal origin: Secondary | ICD-10-CM | POA: Diagnosis not present

## 2018-12-01 DIAGNOSIS — N186 End stage renal disease: Secondary | ICD-10-CM | POA: Diagnosis not present

## 2018-12-01 DIAGNOSIS — D509 Iron deficiency anemia, unspecified: Secondary | ICD-10-CM | POA: Diagnosis not present

## 2018-12-01 DIAGNOSIS — Z992 Dependence on renal dialysis: Secondary | ICD-10-CM | POA: Diagnosis not present

## 2018-12-01 DIAGNOSIS — D689 Coagulation defect, unspecified: Secondary | ICD-10-CM | POA: Diagnosis not present

## 2018-12-04 DIAGNOSIS — D689 Coagulation defect, unspecified: Secondary | ICD-10-CM | POA: Diagnosis not present

## 2018-12-04 DIAGNOSIS — Z992 Dependence on renal dialysis: Secondary | ICD-10-CM | POA: Diagnosis not present

## 2018-12-04 DIAGNOSIS — D509 Iron deficiency anemia, unspecified: Secondary | ICD-10-CM | POA: Diagnosis not present

## 2018-12-04 DIAGNOSIS — N2581 Secondary hyperparathyroidism of renal origin: Secondary | ICD-10-CM | POA: Diagnosis not present

## 2018-12-04 DIAGNOSIS — N186 End stage renal disease: Secondary | ICD-10-CM | POA: Diagnosis not present

## 2018-12-06 DIAGNOSIS — D509 Iron deficiency anemia, unspecified: Secondary | ICD-10-CM | POA: Diagnosis not present

## 2018-12-06 DIAGNOSIS — D689 Coagulation defect, unspecified: Secondary | ICD-10-CM | POA: Diagnosis not present

## 2018-12-06 DIAGNOSIS — Z992 Dependence on renal dialysis: Secondary | ICD-10-CM | POA: Diagnosis not present

## 2018-12-06 DIAGNOSIS — N186 End stage renal disease: Secondary | ICD-10-CM | POA: Diagnosis not present

## 2018-12-06 DIAGNOSIS — N2581 Secondary hyperparathyroidism of renal origin: Secondary | ICD-10-CM | POA: Diagnosis not present

## 2018-12-08 DIAGNOSIS — N2581 Secondary hyperparathyroidism of renal origin: Secondary | ICD-10-CM | POA: Diagnosis not present

## 2018-12-08 DIAGNOSIS — D509 Iron deficiency anemia, unspecified: Secondary | ICD-10-CM | POA: Diagnosis not present

## 2018-12-08 DIAGNOSIS — N186 End stage renal disease: Secondary | ICD-10-CM | POA: Diagnosis not present

## 2018-12-08 DIAGNOSIS — D689 Coagulation defect, unspecified: Secondary | ICD-10-CM | POA: Diagnosis not present

## 2018-12-08 DIAGNOSIS — Z992 Dependence on renal dialysis: Secondary | ICD-10-CM | POA: Diagnosis not present

## 2018-12-10 DIAGNOSIS — N186 End stage renal disease: Secondary | ICD-10-CM | POA: Diagnosis not present

## 2018-12-10 DIAGNOSIS — D689 Coagulation defect, unspecified: Secondary | ICD-10-CM | POA: Diagnosis not present

## 2018-12-10 DIAGNOSIS — N2581 Secondary hyperparathyroidism of renal origin: Secondary | ICD-10-CM | POA: Diagnosis not present

## 2018-12-10 DIAGNOSIS — Z992 Dependence on renal dialysis: Secondary | ICD-10-CM | POA: Diagnosis not present

## 2018-12-10 DIAGNOSIS — D509 Iron deficiency anemia, unspecified: Secondary | ICD-10-CM | POA: Diagnosis not present

## 2018-12-12 DIAGNOSIS — N2581 Secondary hyperparathyroidism of renal origin: Secondary | ICD-10-CM | POA: Diagnosis not present

## 2018-12-12 DIAGNOSIS — N186 End stage renal disease: Secondary | ICD-10-CM | POA: Diagnosis not present

## 2018-12-12 DIAGNOSIS — Z992 Dependence on renal dialysis: Secondary | ICD-10-CM | POA: Diagnosis not present

## 2018-12-12 DIAGNOSIS — D509 Iron deficiency anemia, unspecified: Secondary | ICD-10-CM | POA: Diagnosis not present

## 2018-12-12 DIAGNOSIS — D689 Coagulation defect, unspecified: Secondary | ICD-10-CM | POA: Diagnosis not present

## 2018-12-15 DIAGNOSIS — N2581 Secondary hyperparathyroidism of renal origin: Secondary | ICD-10-CM | POA: Diagnosis not present

## 2018-12-15 DIAGNOSIS — N186 End stage renal disease: Secondary | ICD-10-CM | POA: Diagnosis not present

## 2018-12-15 DIAGNOSIS — Z992 Dependence on renal dialysis: Secondary | ICD-10-CM | POA: Diagnosis not present

## 2018-12-15 DIAGNOSIS — D689 Coagulation defect, unspecified: Secondary | ICD-10-CM | POA: Diagnosis not present

## 2018-12-15 DIAGNOSIS — D509 Iron deficiency anemia, unspecified: Secondary | ICD-10-CM | POA: Diagnosis not present

## 2018-12-18 DIAGNOSIS — Z992 Dependence on renal dialysis: Secondary | ICD-10-CM | POA: Diagnosis not present

## 2018-12-18 DIAGNOSIS — D689 Coagulation defect, unspecified: Secondary | ICD-10-CM | POA: Diagnosis not present

## 2018-12-18 DIAGNOSIS — I129 Hypertensive chronic kidney disease with stage 1 through stage 4 chronic kidney disease, or unspecified chronic kidney disease: Secondary | ICD-10-CM | POA: Diagnosis not present

## 2018-12-18 DIAGNOSIS — D509 Iron deficiency anemia, unspecified: Secondary | ICD-10-CM | POA: Diagnosis not present

## 2018-12-18 DIAGNOSIS — N186 End stage renal disease: Secondary | ICD-10-CM | POA: Diagnosis not present

## 2018-12-18 DIAGNOSIS — N2581 Secondary hyperparathyroidism of renal origin: Secondary | ICD-10-CM | POA: Diagnosis not present

## 2018-12-20 DIAGNOSIS — D631 Anemia in chronic kidney disease: Secondary | ICD-10-CM | POA: Diagnosis not present

## 2018-12-20 DIAGNOSIS — N186 End stage renal disease: Secondary | ICD-10-CM | POA: Diagnosis not present

## 2018-12-20 DIAGNOSIS — D689 Coagulation defect, unspecified: Secondary | ICD-10-CM | POA: Diagnosis not present

## 2018-12-20 DIAGNOSIS — R519 Headache, unspecified: Secondary | ICD-10-CM | POA: Diagnosis not present

## 2018-12-20 DIAGNOSIS — N2581 Secondary hyperparathyroidism of renal origin: Secondary | ICD-10-CM | POA: Diagnosis not present

## 2018-12-20 DIAGNOSIS — D509 Iron deficiency anemia, unspecified: Secondary | ICD-10-CM | POA: Diagnosis not present

## 2018-12-20 DIAGNOSIS — Z992 Dependence on renal dialysis: Secondary | ICD-10-CM | POA: Diagnosis not present

## 2018-12-22 DIAGNOSIS — N186 End stage renal disease: Secondary | ICD-10-CM | POA: Diagnosis not present

## 2018-12-22 DIAGNOSIS — Z992 Dependence on renal dialysis: Secondary | ICD-10-CM | POA: Diagnosis not present

## 2018-12-22 DIAGNOSIS — R519 Headache, unspecified: Secondary | ICD-10-CM | POA: Diagnosis not present

## 2018-12-22 DIAGNOSIS — D631 Anemia in chronic kidney disease: Secondary | ICD-10-CM | POA: Diagnosis not present

## 2018-12-22 DIAGNOSIS — D689 Coagulation defect, unspecified: Secondary | ICD-10-CM | POA: Diagnosis not present

## 2018-12-22 DIAGNOSIS — D509 Iron deficiency anemia, unspecified: Secondary | ICD-10-CM | POA: Diagnosis not present

## 2018-12-22 DIAGNOSIS — N2581 Secondary hyperparathyroidism of renal origin: Secondary | ICD-10-CM | POA: Diagnosis not present

## 2018-12-25 DIAGNOSIS — Z992 Dependence on renal dialysis: Secondary | ICD-10-CM | POA: Diagnosis not present

## 2018-12-25 DIAGNOSIS — N2581 Secondary hyperparathyroidism of renal origin: Secondary | ICD-10-CM | POA: Diagnosis not present

## 2018-12-25 DIAGNOSIS — D631 Anemia in chronic kidney disease: Secondary | ICD-10-CM | POA: Diagnosis not present

## 2018-12-25 DIAGNOSIS — D689 Coagulation defect, unspecified: Secondary | ICD-10-CM | POA: Diagnosis not present

## 2018-12-25 DIAGNOSIS — D509 Iron deficiency anemia, unspecified: Secondary | ICD-10-CM | POA: Diagnosis not present

## 2018-12-25 DIAGNOSIS — R519 Headache, unspecified: Secondary | ICD-10-CM | POA: Diagnosis not present

## 2018-12-25 DIAGNOSIS — N186 End stage renal disease: Secondary | ICD-10-CM | POA: Diagnosis not present

## 2018-12-27 DIAGNOSIS — D689 Coagulation defect, unspecified: Secondary | ICD-10-CM | POA: Diagnosis not present

## 2018-12-27 DIAGNOSIS — R519 Headache, unspecified: Secondary | ICD-10-CM | POA: Diagnosis not present

## 2018-12-27 DIAGNOSIS — N2581 Secondary hyperparathyroidism of renal origin: Secondary | ICD-10-CM | POA: Diagnosis not present

## 2018-12-27 DIAGNOSIS — N186 End stage renal disease: Secondary | ICD-10-CM | POA: Diagnosis not present

## 2018-12-27 DIAGNOSIS — D631 Anemia in chronic kidney disease: Secondary | ICD-10-CM | POA: Diagnosis not present

## 2018-12-27 DIAGNOSIS — Z992 Dependence on renal dialysis: Secondary | ICD-10-CM | POA: Diagnosis not present

## 2018-12-27 DIAGNOSIS — D509 Iron deficiency anemia, unspecified: Secondary | ICD-10-CM | POA: Diagnosis not present

## 2018-12-29 DIAGNOSIS — N2581 Secondary hyperparathyroidism of renal origin: Secondary | ICD-10-CM | POA: Diagnosis not present

## 2018-12-29 DIAGNOSIS — N186 End stage renal disease: Secondary | ICD-10-CM | POA: Diagnosis not present

## 2018-12-29 DIAGNOSIS — Z992 Dependence on renal dialysis: Secondary | ICD-10-CM | POA: Diagnosis not present

## 2018-12-29 DIAGNOSIS — D689 Coagulation defect, unspecified: Secondary | ICD-10-CM | POA: Diagnosis not present

## 2018-12-29 DIAGNOSIS — D631 Anemia in chronic kidney disease: Secondary | ICD-10-CM | POA: Diagnosis not present

## 2018-12-29 DIAGNOSIS — D509 Iron deficiency anemia, unspecified: Secondary | ICD-10-CM | POA: Diagnosis not present

## 2018-12-29 DIAGNOSIS — R519 Headache, unspecified: Secondary | ICD-10-CM | POA: Diagnosis not present

## 2019-01-01 ENCOUNTER — Ambulatory Visit
Admission: RE | Admit: 2019-01-01 | Discharge: 2019-01-01 | Disposition: A | Discharge status: Home / Self Care | Payer: Medicare Other | Source: Ambulatory Visit | Attending: Family Medicine | Admitting: Family Medicine

## 2019-01-01 ENCOUNTER — Other Ambulatory Visit: Payer: Self-pay | Admitting: Family Medicine

## 2019-01-01 DIAGNOSIS — J90 Pleural effusion, not elsewhere classified: Secondary | ICD-10-CM | POA: Diagnosis not present

## 2019-01-01 DIAGNOSIS — N186 End stage renal disease: Secondary | ICD-10-CM | POA: Diagnosis not present

## 2019-01-01 DIAGNOSIS — E559 Vitamin D deficiency, unspecified: Secondary | ICD-10-CM | POA: Diagnosis not present

## 2019-01-01 DIAGNOSIS — M546 Pain in thoracic spine: Secondary | ICD-10-CM | POA: Diagnosis not present

## 2019-01-01 DIAGNOSIS — M549 Dorsalgia, unspecified: Secondary | ICD-10-CM

## 2019-01-01 DIAGNOSIS — I132 Hypertensive heart and chronic kidney disease with heart failure and with stage 5 chronic kidney disease, or end stage renal disease: Secondary | ICD-10-CM | POA: Diagnosis not present

## 2019-01-01 DIAGNOSIS — E7800 Pure hypercholesterolemia, unspecified: Secondary | ICD-10-CM | POA: Diagnosis not present

## 2019-01-01 DIAGNOSIS — E46 Unspecified protein-calorie malnutrition: Secondary | ICD-10-CM | POA: Diagnosis not present

## 2019-01-01 DIAGNOSIS — E039 Hypothyroidism, unspecified: Secondary | ICD-10-CM | POA: Diagnosis not present

## 2019-01-03 DIAGNOSIS — D689 Coagulation defect, unspecified: Secondary | ICD-10-CM | POA: Diagnosis not present

## 2019-01-03 DIAGNOSIS — N186 End stage renal disease: Secondary | ICD-10-CM | POA: Diagnosis not present

## 2019-01-03 DIAGNOSIS — Z992 Dependence on renal dialysis: Secondary | ICD-10-CM | POA: Diagnosis not present

## 2019-01-03 DIAGNOSIS — D631 Anemia in chronic kidney disease: Secondary | ICD-10-CM | POA: Diagnosis not present

## 2019-01-03 DIAGNOSIS — R519 Headache, unspecified: Secondary | ICD-10-CM | POA: Diagnosis not present

## 2019-01-03 DIAGNOSIS — N2581 Secondary hyperparathyroidism of renal origin: Secondary | ICD-10-CM | POA: Diagnosis not present

## 2019-01-03 DIAGNOSIS — D509 Iron deficiency anemia, unspecified: Secondary | ICD-10-CM | POA: Diagnosis not present

## 2019-01-05 DIAGNOSIS — D689 Coagulation defect, unspecified: Secondary | ICD-10-CM | POA: Diagnosis not present

## 2019-01-05 DIAGNOSIS — N2581 Secondary hyperparathyroidism of renal origin: Secondary | ICD-10-CM | POA: Diagnosis not present

## 2019-01-05 DIAGNOSIS — D509 Iron deficiency anemia, unspecified: Secondary | ICD-10-CM | POA: Diagnosis not present

## 2019-01-05 DIAGNOSIS — Z992 Dependence on renal dialysis: Secondary | ICD-10-CM | POA: Diagnosis not present

## 2019-01-05 DIAGNOSIS — R519 Headache, unspecified: Secondary | ICD-10-CM | POA: Diagnosis not present

## 2019-01-05 DIAGNOSIS — D631 Anemia in chronic kidney disease: Secondary | ICD-10-CM | POA: Diagnosis not present

## 2019-01-05 DIAGNOSIS — N186 End stage renal disease: Secondary | ICD-10-CM | POA: Diagnosis not present

## 2019-01-08 DIAGNOSIS — D689 Coagulation defect, unspecified: Secondary | ICD-10-CM | POA: Diagnosis not present

## 2019-01-08 DIAGNOSIS — D631 Anemia in chronic kidney disease: Secondary | ICD-10-CM | POA: Diagnosis not present

## 2019-01-08 DIAGNOSIS — Z992 Dependence on renal dialysis: Secondary | ICD-10-CM | POA: Diagnosis not present

## 2019-01-08 DIAGNOSIS — N186 End stage renal disease: Secondary | ICD-10-CM | POA: Diagnosis not present

## 2019-01-08 DIAGNOSIS — R519 Headache, unspecified: Secondary | ICD-10-CM | POA: Diagnosis not present

## 2019-01-08 DIAGNOSIS — N2581 Secondary hyperparathyroidism of renal origin: Secondary | ICD-10-CM | POA: Diagnosis not present

## 2019-01-08 DIAGNOSIS — D509 Iron deficiency anemia, unspecified: Secondary | ICD-10-CM | POA: Diagnosis not present

## 2019-01-10 DIAGNOSIS — D509 Iron deficiency anemia, unspecified: Secondary | ICD-10-CM | POA: Diagnosis not present

## 2019-01-10 DIAGNOSIS — D689 Coagulation defect, unspecified: Secondary | ICD-10-CM | POA: Diagnosis not present

## 2019-01-10 DIAGNOSIS — N2581 Secondary hyperparathyroidism of renal origin: Secondary | ICD-10-CM | POA: Diagnosis not present

## 2019-01-10 DIAGNOSIS — R519 Headache, unspecified: Secondary | ICD-10-CM | POA: Diagnosis not present

## 2019-01-10 DIAGNOSIS — N186 End stage renal disease: Secondary | ICD-10-CM | POA: Diagnosis not present

## 2019-01-10 DIAGNOSIS — Z992 Dependence on renal dialysis: Secondary | ICD-10-CM | POA: Diagnosis not present

## 2019-01-10 DIAGNOSIS — D631 Anemia in chronic kidney disease: Secondary | ICD-10-CM | POA: Diagnosis not present

## 2019-01-13 DIAGNOSIS — R519 Headache, unspecified: Secondary | ICD-10-CM | POA: Diagnosis not present

## 2019-01-13 DIAGNOSIS — N2581 Secondary hyperparathyroidism of renal origin: Secondary | ICD-10-CM | POA: Diagnosis not present

## 2019-01-13 DIAGNOSIS — D631 Anemia in chronic kidney disease: Secondary | ICD-10-CM | POA: Diagnosis not present

## 2019-01-13 DIAGNOSIS — D509 Iron deficiency anemia, unspecified: Secondary | ICD-10-CM | POA: Diagnosis not present

## 2019-01-13 DIAGNOSIS — Z992 Dependence on renal dialysis: Secondary | ICD-10-CM | POA: Diagnosis not present

## 2019-01-13 DIAGNOSIS — N186 End stage renal disease: Secondary | ICD-10-CM | POA: Diagnosis not present

## 2019-01-13 DIAGNOSIS — D689 Coagulation defect, unspecified: Secondary | ICD-10-CM | POA: Diagnosis not present

## 2019-01-15 DIAGNOSIS — D509 Iron deficiency anemia, unspecified: Secondary | ICD-10-CM | POA: Diagnosis not present

## 2019-01-15 DIAGNOSIS — N186 End stage renal disease: Secondary | ICD-10-CM | POA: Diagnosis not present

## 2019-01-15 DIAGNOSIS — Z992 Dependence on renal dialysis: Secondary | ICD-10-CM | POA: Diagnosis not present

## 2019-01-15 DIAGNOSIS — D631 Anemia in chronic kidney disease: Secondary | ICD-10-CM | POA: Diagnosis not present

## 2019-01-15 DIAGNOSIS — N2581 Secondary hyperparathyroidism of renal origin: Secondary | ICD-10-CM | POA: Diagnosis not present

## 2019-01-15 DIAGNOSIS — D689 Coagulation defect, unspecified: Secondary | ICD-10-CM | POA: Diagnosis not present

## 2019-01-15 DIAGNOSIS — R519 Headache, unspecified: Secondary | ICD-10-CM | POA: Diagnosis not present

## 2019-01-17 DIAGNOSIS — D689 Coagulation defect, unspecified: Secondary | ICD-10-CM | POA: Diagnosis not present

## 2019-01-17 DIAGNOSIS — Z992 Dependence on renal dialysis: Secondary | ICD-10-CM | POA: Diagnosis not present

## 2019-01-17 DIAGNOSIS — D509 Iron deficiency anemia, unspecified: Secondary | ICD-10-CM | POA: Diagnosis not present

## 2019-01-17 DIAGNOSIS — N186 End stage renal disease: Secondary | ICD-10-CM | POA: Diagnosis not present

## 2019-01-17 DIAGNOSIS — D631 Anemia in chronic kidney disease: Secondary | ICD-10-CM | POA: Diagnosis not present

## 2019-01-17 DIAGNOSIS — N2581 Secondary hyperparathyroidism of renal origin: Secondary | ICD-10-CM | POA: Diagnosis not present

## 2019-01-17 DIAGNOSIS — R519 Headache, unspecified: Secondary | ICD-10-CM | POA: Diagnosis not present

## 2019-01-18 DIAGNOSIS — N186 End stage renal disease: Secondary | ICD-10-CM | POA: Diagnosis not present

## 2019-01-18 DIAGNOSIS — I129 Hypertensive chronic kidney disease with stage 1 through stage 4 chronic kidney disease, or unspecified chronic kidney disease: Secondary | ICD-10-CM | POA: Diagnosis not present

## 2019-01-18 DIAGNOSIS — Z992 Dependence on renal dialysis: Secondary | ICD-10-CM | POA: Diagnosis not present

## 2019-01-23 DIAGNOSIS — N186 End stage renal disease: Secondary | ICD-10-CM | POA: Diagnosis not present

## 2019-01-23 DIAGNOSIS — I871 Compression of vein: Secondary | ICD-10-CM | POA: Diagnosis not present

## 2019-01-23 DIAGNOSIS — T82858A Stenosis of vascular prosthetic devices, implants and grafts, initial encounter: Secondary | ICD-10-CM | POA: Diagnosis not present

## 2019-01-23 DIAGNOSIS — Z992 Dependence on renal dialysis: Secondary | ICD-10-CM | POA: Diagnosis not present

## 2019-02-18 DIAGNOSIS — I129 Hypertensive chronic kidney disease with stage 1 through stage 4 chronic kidney disease, or unspecified chronic kidney disease: Secondary | ICD-10-CM | POA: Diagnosis not present

## 2019-02-18 DIAGNOSIS — Z992 Dependence on renal dialysis: Secondary | ICD-10-CM | POA: Diagnosis not present

## 2019-02-18 DIAGNOSIS — N186 End stage renal disease: Secondary | ICD-10-CM | POA: Diagnosis not present

## 2019-03-09 ENCOUNTER — Emergency Department (HOSPITAL_COMMUNITY): Payer: Medicare Other

## 2019-03-09 ENCOUNTER — Encounter (HOSPITAL_COMMUNITY): Payer: Self-pay | Admitting: Emergency Medicine

## 2019-03-09 ENCOUNTER — Other Ambulatory Visit: Payer: Self-pay

## 2019-03-09 ENCOUNTER — Emergency Department (HOSPITAL_COMMUNITY)
Admission: EM | Admit: 2019-03-09 | Discharge: 2019-03-09 | Discharge status: Against Medical Advice | Payer: Medicare Other | Attending: Emergency Medicine | Admitting: Emergency Medicine

## 2019-03-09 DIAGNOSIS — E039 Hypothyroidism, unspecified: Secondary | ICD-10-CM | POA: Insufficient documentation

## 2019-03-09 DIAGNOSIS — N186 End stage renal disease: Secondary | ICD-10-CM | POA: Diagnosis not present

## 2019-03-09 DIAGNOSIS — Z5320 Procedure and treatment not carried out because of patient's decision for unspecified reasons: Secondary | ICD-10-CM | POA: Diagnosis not present

## 2019-03-09 DIAGNOSIS — J90 Pleural effusion, not elsewhere classified: Secondary | ICD-10-CM | POA: Diagnosis not present

## 2019-03-09 DIAGNOSIS — I120 Hypertensive chronic kidney disease with stage 5 chronic kidney disease or end stage renal disease: Secondary | ICD-10-CM | POA: Diagnosis not present

## 2019-03-09 DIAGNOSIS — I4510 Unspecified right bundle-branch block: Secondary | ICD-10-CM | POA: Diagnosis not present

## 2019-03-09 DIAGNOSIS — J9601 Acute respiratory failure with hypoxia: Secondary | ICD-10-CM | POA: Diagnosis not present

## 2019-03-09 DIAGNOSIS — Z79899 Other long term (current) drug therapy: Secondary | ICD-10-CM | POA: Diagnosis not present

## 2019-03-09 DIAGNOSIS — F17210 Nicotine dependence, cigarettes, uncomplicated: Secondary | ICD-10-CM | POA: Diagnosis not present

## 2019-03-09 DIAGNOSIS — Z20822 Contact with and (suspected) exposure to covid-19: Secondary | ICD-10-CM | POA: Insufficient documentation

## 2019-03-09 DIAGNOSIS — I10 Essential (primary) hypertension: Secondary | ICD-10-CM | POA: Diagnosis not present

## 2019-03-09 DIAGNOSIS — R0602 Shortness of breath: Secondary | ICD-10-CM | POA: Diagnosis not present

## 2019-03-09 DIAGNOSIS — Z992 Dependence on renal dialysis: Secondary | ICD-10-CM | POA: Diagnosis not present

## 2019-03-09 DIAGNOSIS — Z7982 Long term (current) use of aspirin: Secondary | ICD-10-CM | POA: Diagnosis not present

## 2019-03-09 DIAGNOSIS — R918 Other nonspecific abnormal finding of lung field: Secondary | ICD-10-CM | POA: Diagnosis not present

## 2019-03-09 DIAGNOSIS — R0902 Hypoxemia: Secondary | ICD-10-CM | POA: Diagnosis not present

## 2019-03-09 LAB — CBC WITH DIFFERENTIAL/PLATELET
Abs Immature Granulocytes: 0.07 10*3/uL (ref 0.00–0.07)
Basophils Absolute: 0.1 10*3/uL (ref 0.0–0.1)
Basophils Relative: 1 %
Eosinophils Absolute: 0.1 10*3/uL (ref 0.0–0.5)
Eosinophils Relative: 1 %
HCT: 39.9 % (ref 36.0–46.0)
Hemoglobin: 12.5 g/dL (ref 12.0–15.0)
Immature Granulocytes: 1 %
Lymphocytes Relative: 12 %
Lymphs Abs: 1.2 10*3/uL (ref 0.7–4.0)
MCH: 32.4 pg (ref 26.0–34.0)
MCHC: 31.3 g/dL (ref 30.0–36.0)
MCV: 103.4 fL — ABNORMAL HIGH (ref 80.0–100.0)
Monocytes Absolute: 1.1 10*3/uL — ABNORMAL HIGH (ref 0.1–1.0)
Monocytes Relative: 11 %
Neutro Abs: 7.4 10*3/uL (ref 1.7–7.7)
Neutrophils Relative %: 74 %
Platelets: 201 10*3/uL (ref 150–400)
RBC: 3.86 MIL/uL — ABNORMAL LOW (ref 3.87–5.11)
RDW: 14.2 % (ref 11.5–15.5)
WBC: 10 10*3/uL (ref 4.0–10.5)
nRBC: 0 % (ref 0.0–0.2)

## 2019-03-09 LAB — COMPREHENSIVE METABOLIC PANEL
ALT: 9 U/L (ref 0–44)
AST: 16 U/L (ref 15–41)
Albumin: 2.9 g/dL — ABNORMAL LOW (ref 3.5–5.0)
Alkaline Phosphatase: 76 U/L (ref 38–126)
Anion gap: 17 — ABNORMAL HIGH (ref 5–15)
BUN: 26 mg/dL — ABNORMAL HIGH (ref 8–23)
CO2: 28 mmol/L (ref 22–32)
Calcium: 9.9 mg/dL (ref 8.9–10.3)
Chloride: 94 mmol/L — ABNORMAL LOW (ref 98–111)
Creatinine, Ser: 7.39 mg/dL — ABNORMAL HIGH (ref 0.44–1.00)
GFR calc Af Amer: 5 mL/min — ABNORMAL LOW (ref >60)
GFR calc non Af Amer: 5 mL/min — ABNORMAL LOW (ref >60)
Glucose, Bld: 104 mg/dL — ABNORMAL HIGH (ref 70–99)
Potassium: 4 mmol/L (ref 3.5–5.1)
Sodium: 139 mmol/L (ref 135–145)
Total Bilirubin: 1.2 mg/dL (ref 0.3–1.2)
Total Protein: 6.6 g/dL (ref 6.5–8.1)

## 2019-03-09 LAB — BRAIN NATRIURETIC PEPTIDE: B Natriuretic Peptide: 3621.9 pg/mL — ABNORMAL HIGH (ref 0.0–100.0)

## 2019-03-09 LAB — TROPONIN I (HIGH SENSITIVITY)
Troponin I (High Sensitivity): 63 ng/L — ABNORMAL HIGH (ref <18)
Troponin I (High Sensitivity): 71 ng/L — ABNORMAL HIGH (ref <18)

## 2019-03-09 LAB — SARS CORONAVIRUS 2 (TAT 6-24 HRS): SARS Coronavirus 2: NEGATIVE

## 2019-03-09 NOTE — ED Triage Notes (Signed)
BIB [facility] from home with c/o increased fatigue/weakness with some shortness of breath. Pt is dialysis MWF  Did not go today. Has been cutting dialysis short the past two weeks to 3 hours instead of 4.  Hx of aneurysm repair 2 years ago.

## 2019-03-09 NOTE — ED Provider Notes (Signed)
[facility] EMERGENCY DEPARTMENT Provider Note   CSN: [id number] Arrival date & time: 03/09/19  4098     History Chief Complaint  Patient presents with  . Shortness of Breath  . Fatigue    Kristin Gross is a 85 y.o. female.  Patient with h/o AAA s/p repair, ESRD M/W/F --presents with worsening shortness of breath this morning with exertional.  Patient states that she has not been feeling well over the past 2 weeks.  She states that she has had increased fatigue and generalized weakness.  Elevated blood pressures at home greater than 119 systolic.  She normally ambulates independently but needed to use a walker today.  She denies any chest pains or abdominal pains.  No nausea, vomiting, diarrhea.  She states that she has had the urge to have a bowel movement, but doesn't have much stool.  Most of her recent dialysis sessions have lasted 3 hours instead of 4 due to feeling poorly.  The onset of this condition was acute. The course is worsening. Aggravating factors: activity. Alleviating factors: none.          Past Medical History:  Diagnosis Date  . AAA (abdominal aortic aneurysm) ([place])   . Chronic kidney disease   . Hyperlipidemia   . Hypertension   . PONV (postoperative nausea and vomiting)   . Thyroid disease     Patient Active Problem List   Diagnosis Date Noted  . Closed left femoral fracture ([place]) 09/10/2018  . Prolonged QT interval 08/07/2018  . Hypertension with fluid overload 08/07/2018  . Hypothyroidism 08/07/2018  . ESRD on dialysis ([place]) 08/26/2017  . AAA (abdominal aortic aneurysm) ([place]) 03/24/2017  . Preoperative cardiovascular examination 03/14/2017  . Hypertensive chronic kidney disease 03/14/2017  . AAA (abdominal aortic aneurysm) without rupture ([place]) 03/14/2017    Past Surgical History:  Procedure Laterality Date  . ABDOMINAL HYSTERECTOMY    . ANTERIOR APPROACH HEMI HIP ARTHROPLASTY Left 09/11/2018   Procedure: Anterior Approach  Hemi Hip Arthroplasty;  Surgeon: Rod Can, MD;  Location: [place];  Service: Orthopedics;  Laterality: Left;  . AORTA - BILATERAL FEMORAL ARTERY BYPASS GRAFT N/A 03/24/2017   Procedure: AORTO BI ILIAC BYPASS GRAFT;  Surgeon: Waynetta Sandy, MD;  Location: [place];  Service: Vascular;  Laterality: N/A;  . AV FISTULA PLACEMENT Left 05/25/2017   Procedure: ARTERIOVENOUS (AV) FISTULA CREATION LEFT ARM;  Surgeon: Waynetta Sandy, MD;  Location: [place];  Service: Vascular;  Laterality: Left;  . BASCILIC VEIN TRANSPOSITION Left 08/02/2017   Procedure: BASILIC VEIN TRANSPOSITION SECOND STAGE;  Surgeon: Waynetta Sandy, MD;  Location: [place];  Service: Vascular;  Laterality: Left;  . CATARACT EXTRACTION, BILATERAL    . EYE SURGERY    . INSERTION OF DIALYSIS CATHETER Right 05/25/2017   Procedure: INSERTION OF TUNNELED  DIALYSIS CATHETER RIGHT INTERNAL JUGULAR;  Surgeon: Waynetta Sandy, MD;  Location: [place];  Service: Vascular;  Laterality: Right;     OB History   No obstetric history on file.     Family History  Problem Relation Age of Onset  . Heart disease Sister   . Heart attack Sister   . Hypertension Sister   . Cancer Mother     Social History   Tobacco Use  . Smoking status: Current Some Day Smoker    Packs/day: 1.00    Years: 60.00    Pack years: 60.00    Types: Cigarettes  . Smokeless tobacco: Never Used  Substance  Use Topics  . Alcohol use: No  . Drug use: No    Home Medications Prior to Admission medications   Medication Sig Start Date End Date Taking? Authorizing Provider  acetaminophen (TYLENOL) 500 MG tablet Take 500 mg by mouth in the morning and at bedtime.   Yes [provider]  aspirin 81 MG chewable tablet Chew 81 mg by mouth daily. 09/13/18  Yes [provider]  cholecalciferol (VITAMIN D) 1000 units tablet Take 1,000 Units by mouth daily.   Yes [provider]  levothyroxine (SYNTHROID, LEVOTHROID)  100 MCG tablet Take 100 mcg by mouth daily before breakfast.  06/17/17  Yes [provider]  metoCLOPramide (REGLAN) 5 MG tablet Take 5 mg by mouth 2 (two) times daily as needed for indigestion. 02/28/19  Yes [provider]  metoprolol tartrate (LOPRESSOR) 50 MG tablet Take 50 mg by mouth daily.    Yes [provider]  nystatin (MYCOSTATIN) 100000 UNIT/ML suspension Take 5 mLs by mouth See admin instructions. Swish and swallow 5 ml three times daily if needed for thrush 02/14/19  Yes [provider]  aspirin 81 MG chewable tablet Chew 1 tablet (81 mg total) by mouth 2 (two) times daily with a meal. Patient not taking: Reported on 03/09/2019 09/12/18   Rod Can, MD  HYDROcodone-acetaminophen (NORCO/VICODIN) 5-325 MG tablet Take 1 tablet by mouth every 4 (four) hours as needed for moderate pain (pain score 4-6). Patient not taking: Reported on 03/09/2019 09/12/18   Rod Can, MD    Allergies    Lisinopril  Review of Systems   Review of Systems  Constitutional: Positive for fatigue. Negative for chills and fever.  HENT: Negative for rhinorrhea and sore throat.   Eyes: Negative for redness.  Respiratory: Positive for shortness of breath. Negative for cough.   Cardiovascular: Negative for chest pain and leg swelling.  Gastrointestinal: Negative for abdominal pain, diarrhea, nausea and vomiting.  Genitourinary: Negative for flank pain.  Musculoskeletal: Negative for myalgias.  Skin: Negative for rash.  Neurological: Positive for weakness. Negative for headaches.    Physical Exam Updated Vital Signs BP (!) 195/80 (BP Location: Right Arm)   Pulse 68   Temp 98.5 F (36.9 C) (Oral)   Resp (!) 22   SpO2 91%   Physical Exam Vitals and nursing note reviewed.  Constitutional:      Appearance: She is well-developed.     Comments: Patient lying nearly flat without any discomfort.  HENT:     Head: Normocephalic and atraumatic.  Eyes:     General:         Right eye: No discharge.        Left eye: No discharge.     Conjunctiva/sclera: Conjunctivae normal.  Neck:     Vascular: No JVD.     Comments: No JVD. Cardiovascular:     Rate and Rhythm: Normal rate and regular rhythm.     Heart sounds: Normal heart sounds. No murmur.  Pulmonary:     Effort: Pulmonary effort is normal.     Breath sounds: Normal breath sounds. No decreased breath sounds.  Abdominal:     Palpations: Abdomen is soft.     Tenderness: There is no abdominal tenderness. There is no guarding or rebound.     Hernia: A hernia is present.     Comments: Patient with small soft umbilical hernia, easily reduced.  No pulsatile masses.  Musculoskeletal:     Cervical back: Normal range of motion and neck  supple.     Right lower leg: No tenderness. No edema.     Left lower leg: No tenderness. No edema.  Skin:    General: Skin is warm and dry.  Neurological:     Mental Status: She is alert.     ED Results / Procedures / Treatments   Labs (all labs ordered are listed, but only abnormal results are displayed) Labs Reviewed  CBC WITH DIFFERENTIAL/PLATELET - Abnormal; Notable for the following components:      Result Value   RBC 3.86 (*)    MCV 103.4 (*)    Monocytes Absolute 1.1 (*)    All other components within normal limits  COMPREHENSIVE METABOLIC PANEL - Abnormal; Notable for the following components:   Chloride 94 (*)    Glucose, Bld 104 (*)    BUN 26 (*)    Creatinine, Ser 7.39 (*)    Albumin 2.9 (*)    GFR calc non Af Amer 5 (*)    GFR calc Af Amer 5 (*)    Anion gap 17 (*)    All other components within normal limits  BRAIN NATRIURETIC PEPTIDE - Abnormal; Notable for the following components:   B Natriuretic Peptide 3,621.9 (*)    All other components within normal limits  TROPONIN I (HIGH SENSITIVITY) - Abnormal; Notable for the following components:   Troponin I (High Sensitivity) 63 (*)    All other components within normal limits  TROPONIN I  (HIGH SENSITIVITY) - Abnormal; Notable for the following components:   Troponin I (High Sensitivity) 71 (*)    All other components within normal limits  SARS CORONAVIRUS 2 (TAT 6-24 HRS)    ED ECG REPORT   Date: 03/09/2019  Rate: 74  Rhythm: normal sinus rhythm  QRS Axis: normal  Intervals: normal  ST/T Wave abnormalities: nonspecific T wave changes  Conduction Disutrbances:right bundle branch block  Narrative Interpretation:   Old EKG Reviewed: unchanged  I have personally reviewed the EKG tracing and agree with the computerized printout as noted.  Radiology DG Chest Portable 1 View  Result Date: 03/09/2019 CLINICAL DATA:  Shortness of breath EXAM: PORTABLE CHEST 1 VIEW COMPARISON:  01/01/2019 FINDINGS: Persistent left pleural effusion. Left basilar atelectasis/consolidation has slightly increased. Unchanged interstitial prominence. Stable cardiomediastinal contours. No pneumothorax. IMPRESSION: Persistent left pleural effusion. Slightly increased left basilar atelectasis/consolidation. Chronic interstitial prominence. Electronically Signed   By: Macy Mis M.D.   On: 03/09/2019 10:24    Procedures Procedures (including critical care time)  Medications Ordered in ED Medications - No data to display  ED Course  I have reviewed the triage vital signs and the nursing notes.  Pertinent labs & imaging results that were available during my care of the patient were reviewed by me and considered in my medical decision making (see chart for details).  Patient seen and examined. Work-up initiated.  Oxygen saturation borderline but patient in no distress.  No overt signs of fluid overload or heart failure.  EKG unchanged from previous.  Vital signs reviewed and are as follows: BP (!) 195/80 (BP Location: Right Arm)   Pulse 68   Temp 98.5 F (36.9 C) (Oral)   Resp (!) 22   SpO2 91%    Troponin 63 --> 71.  Doubt ACS given lack of chest pain and EKG.  Suspect this is likely  related to the patient's chronic kidney disease and mild fluid overload state.  CT personally reviewed.  Findings are concerning for metastatic lung cancer  to the spine and lymph nodes.  I had a long discussion with the patient regarding her results.  She is resistant to being admitted to the hospital.  She is obviously scared about this potential diagnosis.  We discussed potential hospital course and uncertainty of diagnosis at this point.  I offered to call the patient's family to discuss this further and patient declines.  She would like to talk with her family herself.    I left the room as patient was going to call her family.  Upon returning, she remains comfortable.  She was unable to operate the phone to call her daughter.  I again offered and patient declines.  At this point she is set on going home.  She bargains that she wants the weekend to go home and think about things and discussed with her family and break the news to them.  She is concerned that there been to be anxious about the potential diagnosis.  She would like to return on Sunday or Monday for further work-up.  Patient is aware of the risks.  I asked her what she will do of her breathing gets out of control and she states that she will just need to return to the ED.  At this point, I am confident that the patient is of sound mind and judgment and has capacity make medical decisions.  I invited the patient to return at any time if she changes her mind or feels worse.  She states that she will do that.  I called the patient's primary care office and spoke with Dr. Raul Del MA, [place].  I asked if they would help coordinate care, and potentially look into getting oxygen for the patient.  I asked them to reach out and potentially follow-up next week if the patient does not return to the emergency department.    Patient be discharged home at this time.  BP (!) 198/79   Pulse 64   Temp 98.5 F (36.9 C) (Oral)   Resp (!) 21   SpO2 98%        MDM Rules/Calculators/A&P                      Patient with gradual worsening shortness of breath, now with hypoxia.  Patient has findings on CT suspicious for primary bronchogenic carcinoma with metastasis to other areas of the lungs, lymph nodes, spine.  Discussion with patient as above.  She is unwilling to stay in the hospital for evaluation further work-up.  In regards to dialysis, strongly encouraged patient to follow-up as soon as she can.  She does not meet criteria for emergent dialysis today.  Normal potassium, slightly elevated BUN with normal mental status.  While I believe the patient is mildly fluid overloaded, she is in no respiratory distress.  She is unwilling to stay regardless for further treatment.   Final Clinical Impression(s) / ED Diagnoses Final diagnoses:  Acute respiratory failure with hypoxia ([place])  Lung mass  Pleural effusion  Essential hypertension    Rx / DC Orders ED Discharge Orders    None       Carlisle Cater, PA-C 03/09/19 1510    Quintella Reichert, MD 03/10/19 1312

## 2019-03-09 NOTE — ED Notes (Signed)
Pt leaving AMA. Pt has verbalized she understands the risks of leaving.

## 2019-03-09 NOTE — Discharge Instructions (Addendum)
It appears that you have fluid building up in your lung which is causing you to be short of breath and have low oxygen levels.  You also have findings concerning for lung cancer on CAT scan today, although we cannot say this for certain.  We would like for you to be admitted for further evaluation.  If you change your mind, you are always welcome to come back to the hospital.  Please follow-up with Dr. Brigitte Pulse as soon as possible.  If you feel worse or change your mind, you are welcome to come back at any time.

## 2019-03-09 NOTE — ED Notes (Signed)
O2 placed at 3L/m/[place]- sats increased to 97-99%

## 2019-03-13 DIAGNOSIS — C3490 Malignant neoplasm of unspecified part of unspecified bronchus or lung: Secondary | ICD-10-CM | POA: Diagnosis not present

## 2019-03-13 DIAGNOSIS — N186 End stage renal disease: Secondary | ICD-10-CM | POA: Diagnosis not present

## 2019-03-13 DIAGNOSIS — E46 Unspecified protein-calorie malnutrition: Secondary | ICD-10-CM | POA: Diagnosis not present

## 2019-03-13 DIAGNOSIS — C7951 Secondary malignant neoplasm of bone: Secondary | ICD-10-CM | POA: Diagnosis not present

## 2019-03-14 ENCOUNTER — Telehealth: Payer: Self-pay | Admitting: Nurse Practitioner

## 2019-03-14 NOTE — Telephone Encounter (Signed)
Called patient to schedule Palliative Consult and spoke with daughter, Acey Lav.  After discussing Palliative services and all questions were answered they were in agreement with this.  I have scheduled a Telephone Consult for 03/20/19 @ 12:30 PM

## 2019-03-15 DIAGNOSIS — E039 Hypothyroidism, unspecified: Secondary | ICD-10-CM | POA: Diagnosis not present

## 2019-03-18 DIAGNOSIS — Z992 Dependence on renal dialysis: Secondary | ICD-10-CM | POA: Diagnosis not present

## 2019-03-18 DIAGNOSIS — N186 End stage renal disease: Secondary | ICD-10-CM | POA: Diagnosis not present

## 2019-03-18 DIAGNOSIS — I129 Hypertensive chronic kidney disease with stage 1 through stage 4 chronic kidney disease, or unspecified chronic kidney disease: Secondary | ICD-10-CM | POA: Diagnosis not present

## 2019-03-20 ENCOUNTER — Other Ambulatory Visit: Payer: Medicare Other | Admitting: Nurse Practitioner

## 2019-03-20 ENCOUNTER — Other Ambulatory Visit: Payer: Self-pay

## 2019-03-20 ENCOUNTER — Telehealth: Payer: Self-pay | Admitting: Nurse Practitioner

## 2019-03-20 NOTE — Telephone Encounter (Signed)
I attempted to contact Kristin Gross for scheduled initial pc visit, message left with contact information, Kristin Gross was not available.

## 2019-04-03 ENCOUNTER — Telehealth: Payer: Self-pay | Admitting: Nurse Practitioner

## 2019-04-03 NOTE — Telephone Encounter (Signed)
Spoke with patient's daughter Acey Lav, to see if she wanted to reschedule the Palliative Initial Consult that had been scheduled for 3/2.  Daughter requested to put this on hold due to patient may be stopping dialysis and if she does then they will be calling Hospice services in.  Daughter stated that she would call me back Thurs. 3/18 to let me know what patient has decided to do.

## 2019-04-19 DEATH — deceased

## 2019-04-24 ENCOUNTER — Institutional Professional Consult (permissible substitution): Payer: Medicare Other | Admitting: Internal Medicine

## 2020-03-14 IMAGING — DX DG THORACIC SPINE 2V
3 series · 3 of 3 positions shown · non-contrast
Comparison: Chest x-ray same day and 09/10/2018.

CLINICAL DATA: Back pain.

EXAM:
THORACIC SPINE 2 VIEWS

[dg thoracic spine 2 view (1 of 3)]
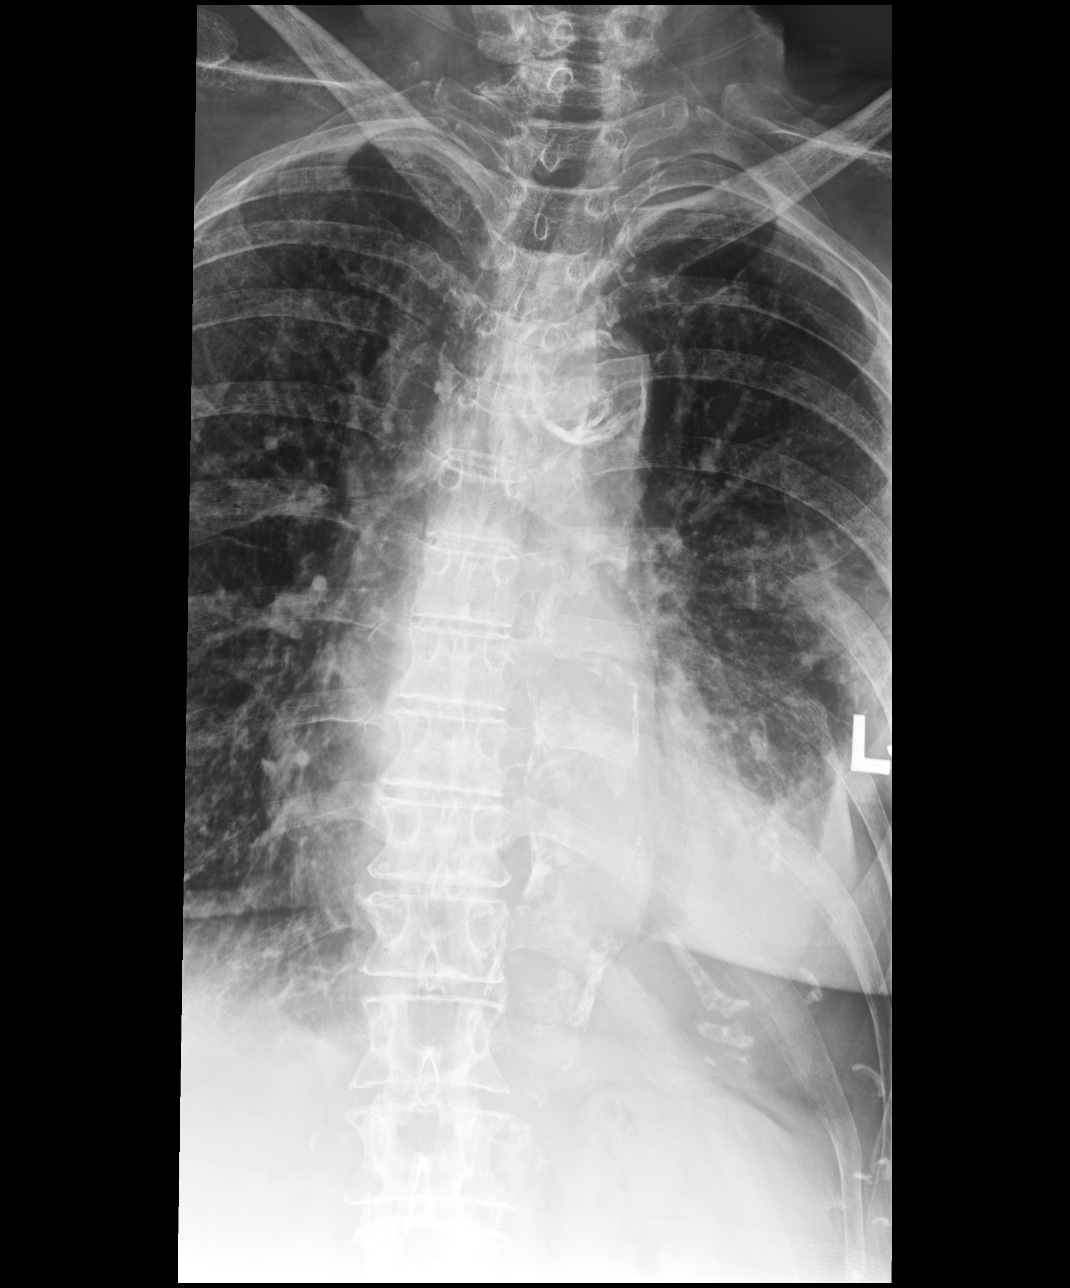

[dg thoracic spine 2 view (2 of 3)]
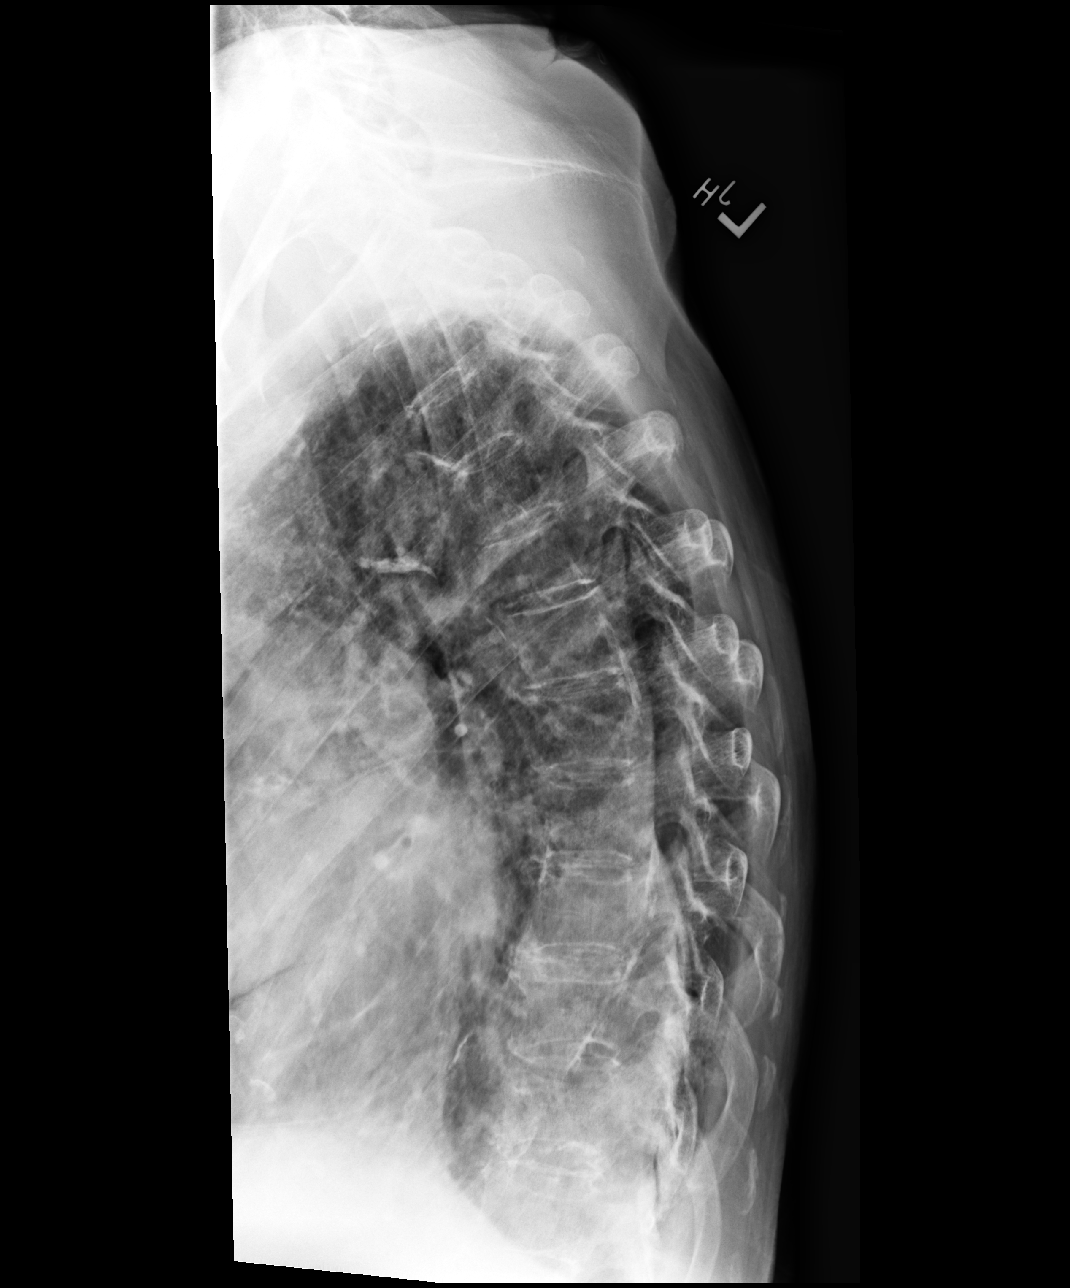

[dg thoracic spine 2 view (3 of 3)]
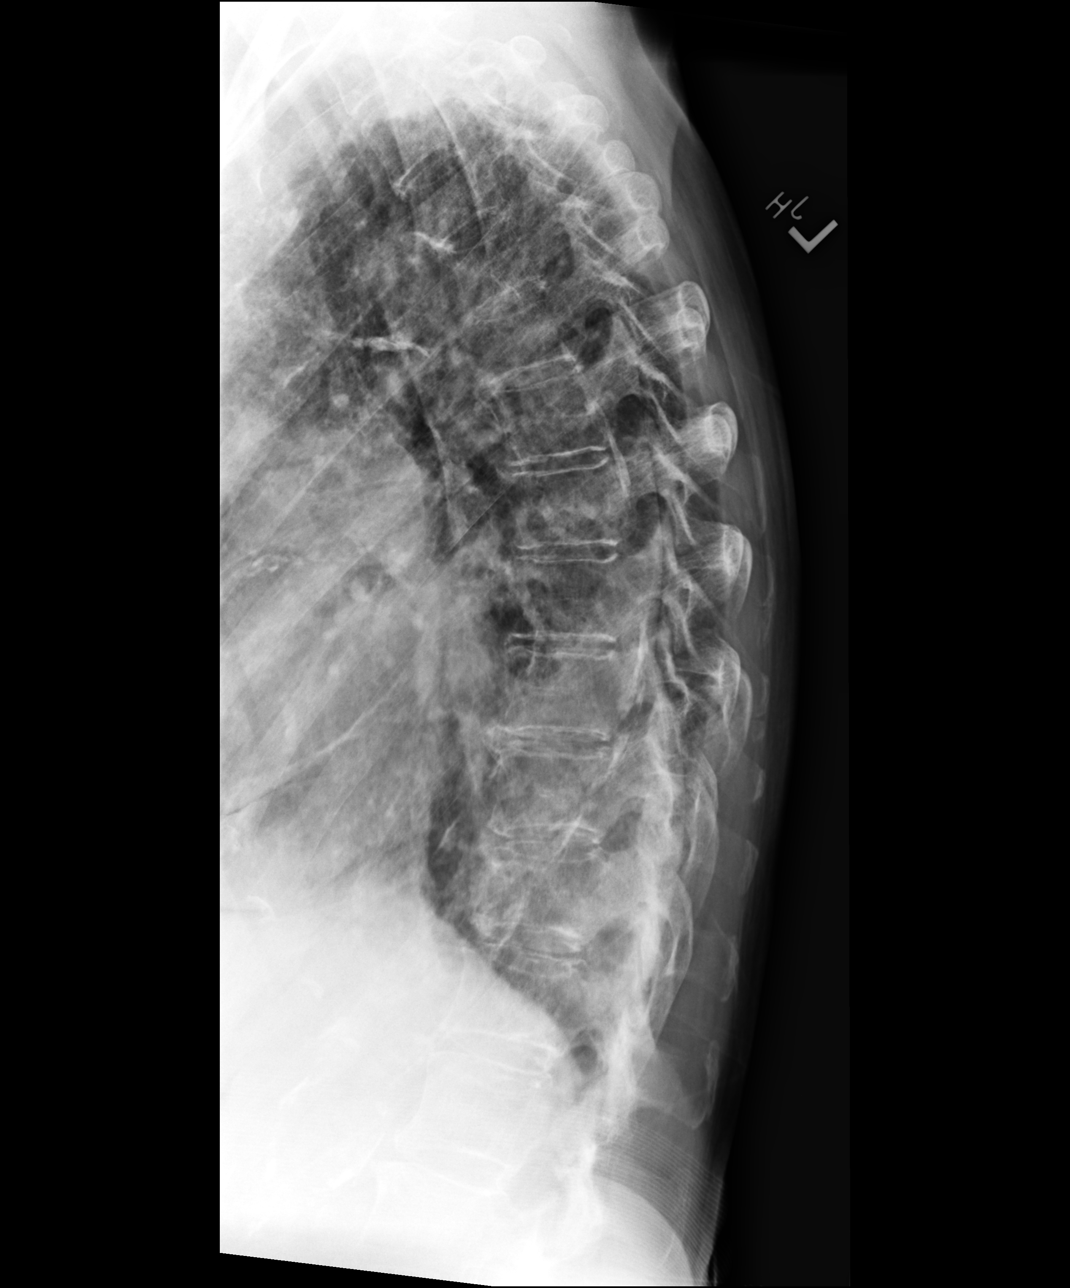

[3 of 3 positions shown; findings below may reference images not displayed]

FINDINGS: Thoracic spine scoliosis concave left. Diffuse multilevel
degenerative change. No acute bony abnormality identified.
Cardiomegaly with bilateral interstitial prominence and left pleural
effusion and probable left fluid pseudotumor again noted. Reference
is made to prior chest x-ray report same day.
IMPRESSION: 1. Thoracic spine scoliosis concave left. Diffuse multilevel
degenerative change. No acute bony abnormality.

2. Changes consistent with congestive heart failure, reference is
made to chest x-ray report same day.

## 2020-03-14 IMAGING — DX DG CHEST 2V
2 series · 2 of 2 positions shown · non-contrast
Comparison: 09/10/2018.

CLINICAL DATA: Back pain.

EXAM:
CHEST - 2 VIEW

[dg chest 2 view (1 of 2)]
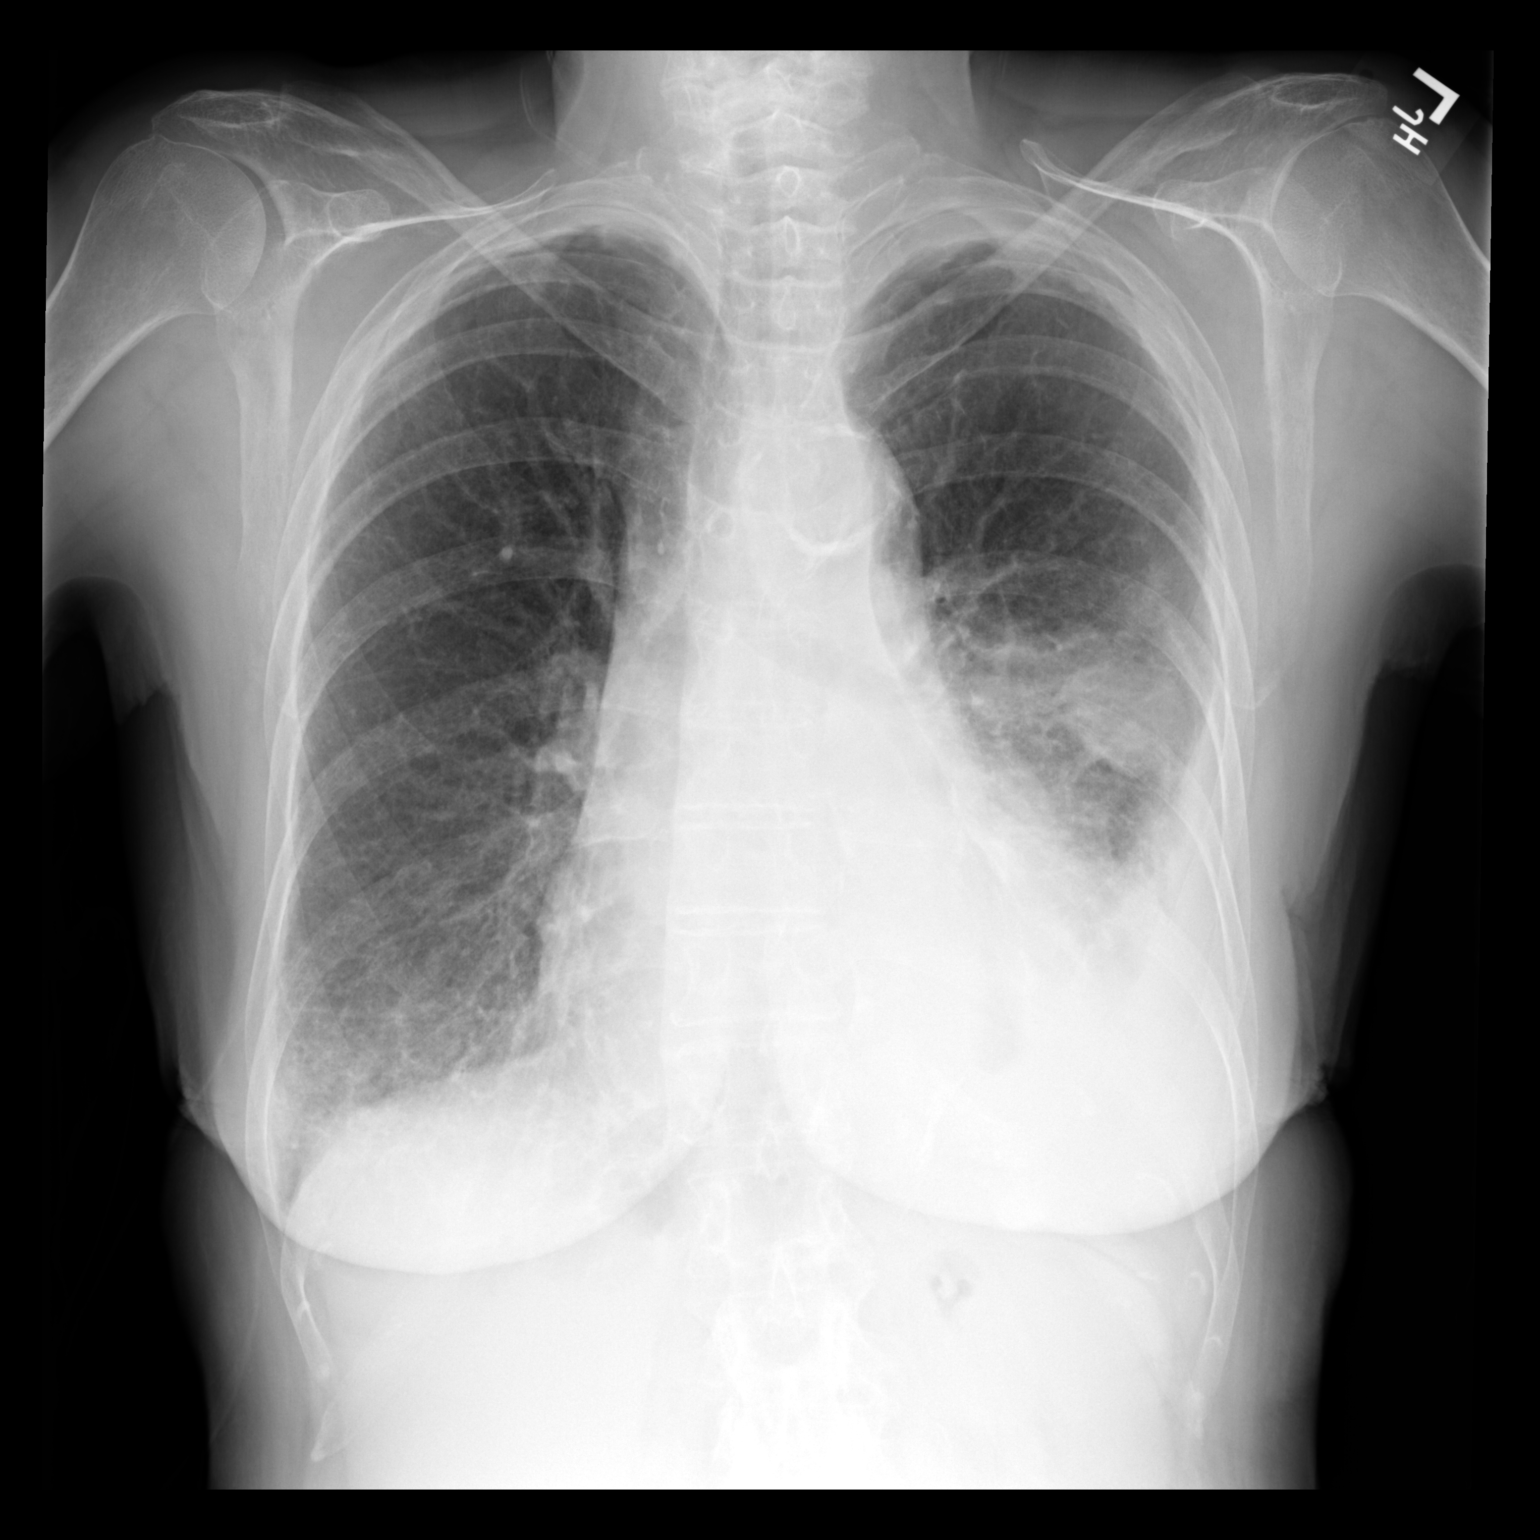

[dg chest 2 view (2 of 2)]
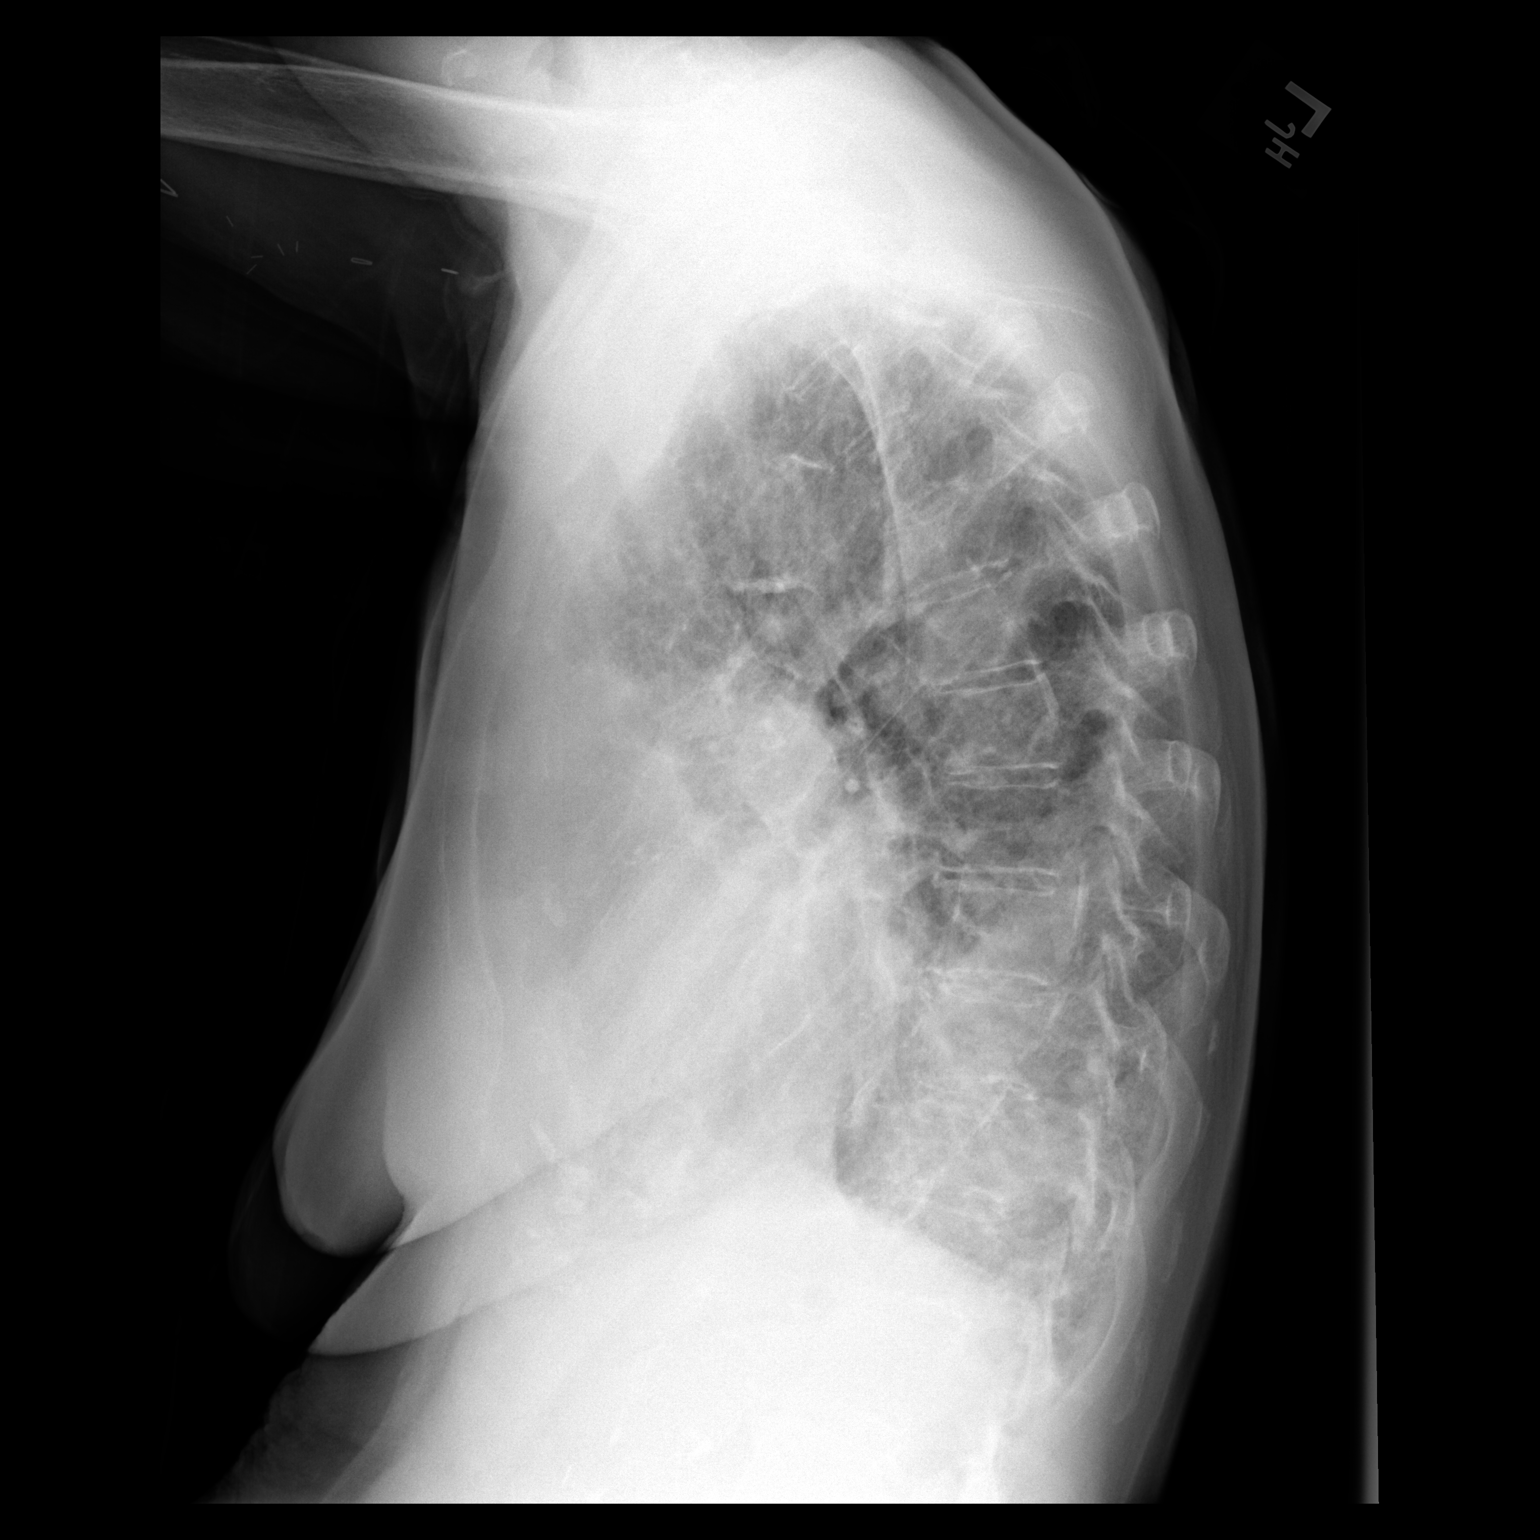

[2 of 2 positions shown; findings below may reference images not displayed]

FINDINGS: Cardiomegaly. Diffuse interstitial prominence suggesting pulmonary
interstitial edema again noted. Pneumonitis cannot be excluded.
Moderate size left pleural effusion. Rounded density noted the left
mid chest most likely fluid pseudotumor. Follow-up exams to
demonstrate resolution suggested. No pneumothorax.
IMPRESSION: Cardiomegaly. Diffuse interstitial prominence suggesting pulmonary
interstitial edema again noted noted. Pneumonitis cannot be
excluded. Moderate size left pleural effusion. Rounded density noted
in the left mid chest most likely fluid pseudotumor. Follow-up exams
to demonstrate resolution suggested.
